# Patient Record
Sex: Female | Born: 1945
Health system: Southern US, Community
[De-identification: ages and names within clinical notes are randomized; demographics above are authoritative.]

## PROBLEM LIST (undated history)

## (undated) ENCOUNTER — Ambulatory Visit

## (undated) DIAGNOSIS — R7303 Prediabetes: Secondary | ICD-10-CM

## (undated) DIAGNOSIS — K259 Gastric ulcer, unspecified as acute or chronic, without hemorrhage or perforation: Secondary | ICD-10-CM

## (undated) DIAGNOSIS — Z8049 Family history of malignant neoplasm of other genital organs: Secondary | ICD-10-CM

## (undated) DIAGNOSIS — G8929 Other chronic pain: Secondary | ICD-10-CM

## (undated) DIAGNOSIS — Z803 Family history of malignant neoplasm of breast: Secondary | ICD-10-CM

## (undated) DIAGNOSIS — M1990 Unspecified osteoarthritis, unspecified site: Secondary | ICD-10-CM

## (undated) DIAGNOSIS — T8859XA Other complications of anesthesia, initial encounter: Secondary | ICD-10-CM

## (undated) DIAGNOSIS — E119 Type 2 diabetes mellitus without complications: Secondary | ICD-10-CM

## (undated) DIAGNOSIS — Z801 Family history of malignant neoplasm of trachea, bronchus and lung: Secondary | ICD-10-CM

## (undated) DIAGNOSIS — I10 Essential (primary) hypertension: Secondary | ICD-10-CM

## (undated) DIAGNOSIS — K219 Gastro-esophageal reflux disease without esophagitis: Secondary | ICD-10-CM

## (undated) HISTORY — DX: Family history of malignant neoplasm of breast: Z80.3

## (undated) HISTORY — PX: BUNIONECTOMY: SHX129

## (undated) HISTORY — DX: Other chronic pain: G89.29

## (undated) HISTORY — PX: ABDOMINAL HYSTERECTOMY: SHX81

## (undated) HISTORY — PX: UPPER GI ENDOSCOPY: SHX6162

## (undated) HISTORY — DX: Prediabetes: R73.03

## (undated) HISTORY — PX: TONSILLECTOMY: SUR1361

## (undated) HISTORY — PX: BREAST ENHANCEMENT SURGERY: SHX7

## (undated) HISTORY — DX: Family history of malignant neoplasm of other genital organs: Z80.49

## (undated) HISTORY — DX: Family history of malignant neoplasm of trachea, bronchus and lung: Z80.1

## (undated) HISTORY — PX: OTHER SURGICAL HISTORY: SHX169

## (undated) HISTORY — PX: CATARACT EXTRACTION: SUR2

## (undated) HISTORY — PX: RADIAL KERATOTOMY: SHX217

---

## 1985-03-15 HISTORY — PX: AUGMENTATION MAMMAPLASTY: SUR837

## 2006-06-15 ENCOUNTER — Emergency Department (HOSPITAL_COMMUNITY): Admission: EM | Admit: 2006-06-15 | Discharge: 2006-06-15 | Payer: Self-pay | Admitting: Emergency Medicine

## 2009-01-21 ENCOUNTER — Ambulatory Visit: Payer: Self-pay

## 2011-10-14 DEATH — deceased

## 2011-11-04 ENCOUNTER — Ambulatory Visit: Payer: Self-pay | Admitting: Family Medicine

## 2011-11-26 ENCOUNTER — Ambulatory Visit: Payer: Self-pay | Admitting: Unknown Physician Specialty

## 2013-01-04 ENCOUNTER — Ambulatory Visit: Payer: Self-pay | Admitting: Family Medicine

## 2013-09-03 DIAGNOSIS — I10 Essential (primary) hypertension: Secondary | ICD-10-CM | POA: Insufficient documentation

## 2014-01-16 ENCOUNTER — Ambulatory Visit: Payer: Self-pay | Admitting: Ophthalmology

## 2014-01-29 NOTE — Pre-Procedure Instructions (Signed)
Tonya Bowen  01/29/2014   Your procedure is scheduled on:  Wednesday February 06, 2014 at 12:00 PM.  Report to [facility] Admitting at 10:00 AM.  Call this number if you have problems the morning of surgery: [phone]  Call this number if you have any questions prior to surgery: [phone]  Remember:   Do not eat food or drink liquids after midnight.   Take these medicines the morning of surgery with A SIP OF WATER: NONE   Do not wear jewelry, make-up or nail polish.  Do not wear lotions, powders, or perfumes.   Do not shave 48 hours prior to surgery.   Do not bring valuables to the hospital.  [facility] is not responsible for any belongings or valuables.               Contacts, dentures or bridgework may not be worn into surgery.  Leave suitcase in the car. After surgery it may be brought to your room.  For patients admitted to the hospital, discharge time is determined by your treatment team.               Patients discharged the day of surgery will not be allowed to drive home.  Name and phone number of your driver:   Special Instructions: Shower using CHG soap the night before and the morning of your surgery   Please read over the following fact sheets that you were given: Pain Booklet, Coughing and Deep Breathing, MRSA Information and Surgical Site Infection Prevention

## 2014-01-30 ENCOUNTER — Ambulatory Visit (HOSPITAL_COMMUNITY)
Admission: RE | Admit: 2014-01-30 | Discharge: 2014-01-30 | Disposition: A | Discharge status: Home / Self Care | Payer: Medicare Other | Source: Ambulatory Visit | Attending: Surgery | Admitting: Surgery

## 2014-01-30 ENCOUNTER — Encounter (HOSPITAL_COMMUNITY): Payer: Self-pay

## 2014-01-30 ENCOUNTER — Encounter (HOSPITAL_COMMUNITY)
Admission: RE | Admit: 2014-01-30 | Discharge: 2014-01-30 | Disposition: A | Discharge status: Home / Self Care | Payer: Medicare Other | Source: Ambulatory Visit | Attending: Orthopedic Surgery | Admitting: Orthopedic Surgery

## 2014-01-30 DIAGNOSIS — Z01818 Encounter for other preprocedural examination: Secondary | ICD-10-CM | POA: Insufficient documentation

## 2014-01-30 DIAGNOSIS — Z01810 Encounter for preprocedural cardiovascular examination: Secondary | ICD-10-CM | POA: Insufficient documentation

## 2014-01-30 HISTORY — DX: Essential (primary) hypertension: I10

## 2014-01-30 HISTORY — DX: Gastric ulcer, unspecified as acute or chronic, without hemorrhage or perforation: K25.9

## 2014-01-30 LAB — URINALYSIS, ROUTINE W REFLEX MICROSCOPIC
BILIRUBIN URINE: NEGATIVE
GLUCOSE, UA: NEGATIVE mg/dL
HGB URINE DIPSTICK: NEGATIVE
KETONES UR: NEGATIVE mg/dL
Nitrite: NEGATIVE
PROTEIN: NEGATIVE mg/dL
Specific Gravity, Urine: 1.02 (ref 1.005–1.030)
UROBILINOGEN UA: 1 mg/dL (ref 0.0–1.0)
pH: 6.5 (ref 5.0–8.0)

## 2014-01-30 LAB — PROTIME-INR
INR: 0.96 (ref 0.00–1.49)
PROTHROMBIN TIME: 12.9 s (ref 11.6–15.2)

## 2014-01-30 LAB — COMPREHENSIVE METABOLIC PANEL
ALBUMIN: 4 g/dL (ref 3.5–5.2)
ALK PHOS: 97 U/L (ref 39–117)
ALT: 26 U/L (ref 0–35)
ANION GAP: 14 (ref 5–15)
AST: 24 U/L (ref 0–37)
BILIRUBIN TOTAL: 0.3 mg/dL (ref 0.3–1.2)
BUN: 11 mg/dL (ref 6–23)
CHLORIDE: 105 meq/L (ref 96–112)
CO2: 25 mEq/L (ref 19–32)
CREATININE: 0.72 mg/dL (ref 0.50–1.10)
Calcium: 9.5 mg/dL (ref 8.4–10.5)
GFR calc non Af Amer: 87 mL/min — ABNORMAL LOW (ref >90)
GLUCOSE: 91 mg/dL (ref 70–99)
POTASSIUM: 4.2 meq/L (ref 3.7–5.3)
Sodium: 144 mEq/L (ref 137–147)
Total Protein: 7.5 g/dL (ref 6.0–8.3)

## 2014-01-30 LAB — APTT: APTT: 26 s (ref 24–37)

## 2014-01-30 LAB — SURGICAL PCR SCREEN
MRSA, PCR: NEGATIVE
Staphylococcus aureus: NEGATIVE

## 2014-01-30 LAB — URINE MICROSCOPIC-ADD ON

## 2014-01-30 LAB — CBC
HEMATOCRIT: 42 % (ref 36.0–46.0)
HEMOGLOBIN: 13.9 g/dL (ref 12.0–15.0)
MCH: 30.2 pg (ref 26.0–34.0)
MCHC: 33.1 g/dL (ref 30.0–36.0)
MCV: 91.3 fL (ref 78.0–100.0)
Platelets: 363 10*3/uL (ref 150–400)
RBC: 4.6 MIL/uL (ref 3.87–5.11)
RDW: 12.7 % (ref 11.5–15.5)
WBC: 9.9 10*3/uL (ref 4.0–10.5)

## 2014-01-30 NOTE — Progress Notes (Signed)
PCP is Dr. Lovie Macadamia. Clearance in EPIC under "care everywhere." Patient informed Nurse that she had a EKG this past week in Dr. Reuel Boom office. EKG requested. Patient denied having any cardiac or pulmonary issues.

## 2014-02-01 NOTE — H&P (Signed)
Tonya Bowen is an 68 y.o. female.    History of Present Illness Collie Siad M. Toomes; 02/01/2014 10:36 AM) The patient is a 68 year old female who comes in today for a preoperative History and Physical. The patient is scheduled for a L2 Kyphoplasty to be performed by Dr. Duane Lope D. Rolena Infante, MD at [facility] on 02/06/14 .  Additional reasons for visit:  Follow-up back is described as the following: The patient is being followed for their back pain. Symptoms reported today include: pain and aching. The patient states that they are doing poorly. The patient reports their current pain level to be 4 / 10. The patient presents today following MRI (lumbar @ [facility]).  Allergies No Known Drug Allergies08/17/2015  Family History Cancer Brother, Mother. Chronic Obstructive Lung Disease Brother. Congestive Heart Failure Father, Sister. Depression Mother. Diabetes Mellitus Maternal Grandmother, Sister. Drug / Alcohol Addiction Father. Heart Disease Father. Hypertension Maternal Grandmother, Mother. Osteoporosis Mother. Rheumatoid Arthritis Brother.  Social History  Tobacco use Never smoker. 10/29/2013 Tobacco / smoke exposure 10/29/2013: no Children 2 Current drinker 10/29/2013: Currently drinks wine only occasionally per week Current work status retired Exercise Exercises rarely; does other Living situation live alone Marital status divorced No history of drug/alcohol rehab Not under pain contract Number of flights of stairs before winded 2-3  Medication History  Vitamin C (Oral) Active. (qd) Vitamin D High Potency (1000UNIT Capsule, Oral) Active. (qd) Calcium 600 (1500MG Tablet, Oral) Active. (qd) AmLODIPine Besylate (5MG Tablet, Oral) Active. (qd) Medications Reconciled  Vitals 02/01/2014 10:34 AM Weight: 219 lb Height: 65in Body Surface Area: 2.06 m Body Mass Index: 36.44 kg/m  Temp.: 98.62F  Pulse: 75 (Regular)  BP: 142/76 (Sitting,  Left Arm, Standard)   Past Medical History  Diagnosis Date  . Hypertension   . Stomach ulcer     Past Surgical History  Procedure Laterality Date  . Abdominal hysterectomy    . Tonsillectomy    . Bunionectomy Bilateral     Great toes  . Breast enhancement surgery    . Cataract extraction Left   . Radial keratotomy    . Colonoscopy    . Upper gi endoscopy      No family history on file. Social History:  reports that she has never smoked. She does not have any smokeless tobacco history on file. She reports that she drinks about 1.2 oz of alcohol per week. She reports that she does not use illicit drugs.  Allergies: No Known Allergies  No prescriptions prior to admission    Results for orders placed or performed during the hospital encounter of 01/30/14 (from the past 48 hour(s))  Surgical pcr screen     Status: None   Collection Time: 01/30/14  1:53 PM  Result Value Ref Range   MRSA, PCR NEGATIVE NEGATIVE   Staphylococcus aureus NEGATIVE NEGATIVE    Comment:        The Xpert SA Assay (FDA approved for NASAL specimens in patients over 60 years of age), is one component of a comprehensive surveillance program.  Test performance has been validated by [employer] for patients greater than or equal to 62 year old. It is not intended to diagnose infection nor to guide or monitor treatment.   Urinalysis, Routine w reflex microscopic     Status: Abnormal   Collection Time: 01/30/14  1:53 PM  Result Value Ref Range   Color, Urine YELLOW YELLOW   APPearance CLEAR CLEAR   Specific Gravity, Urine 1.020 1.005 -  1.030   pH 6.5 5.0 - 8.0   Glucose, UA NEGATIVE NEGATIVE mg/dL   Hgb urine dipstick NEGATIVE NEGATIVE   Bilirubin Urine NEGATIVE NEGATIVE   Ketones, ur NEGATIVE NEGATIVE mg/dL   Protein, ur NEGATIVE NEGATIVE mg/dL   Urobilinogen, UA 1.0 0.0 - 1.0 mg/dL   Nitrite NEGATIVE NEGATIVE   Leukocytes, UA SMALL (A) NEGATIVE  Urine microscopic-add on     Status: None    Collection Time: 01/30/14  1:53 PM  Result Value Ref Range   Squamous Epithelial / LPF RARE RARE   WBC, UA 3-6 <3 WBC/hpf   Bacteria, UA RARE RARE   Urine-Other MUCOUS PRESENT   APTT     Status: None   Collection Time: 01/30/14  1:54 PM  Result Value Ref Range   aPTT 26 24 - 37 seconds  CBC     Status: None   Collection Time: 01/30/14  1:54 PM  Result Value Ref Range   WBC 9.9 4.0 - 10.5 K/uL   RBC 4.60 3.87 - 5.11 MIL/uL   Hemoglobin 13.9 12.0 - 15.0 g/dL   HCT 42.0 36.0 - 46.0 %   MCV 91.3 78.0 - 100.0 fL   MCH 30.2 26.0 - 34.0 pg   MCHC 33.1 30.0 - 36.0 g/dL   RDW 12.7 11.5 - 15.5 %   Platelets 363 150 - 400 K/uL  Comprehensive metabolic panel     Status: Abnormal   Collection Time: 01/30/14  1:54 PM  Result Value Ref Range   Sodium 144 137 - 147 mEq/L   Potassium 4.2 3.7 - 5.3 mEq/L   Chloride 105 96 - 112 mEq/L   CO2 25 19 - 32 mEq/L   Glucose, Bld 91 70 - 99 mg/dL   BUN 11 6 - 23 mg/dL   Creatinine, Ser 0.72 0.50 - 1.10 mg/dL   Calcium 9.5 8.4 - 10.5 mg/dL   Total Protein 7.5 6.0 - 8.3 g/dL   Albumin 4.0 3.5 - 5.2 g/dL   AST 24 0 - 37 U/L   ALT 26 0 - 35 U/L   Alkaline Phosphatase 97 39 - 117 U/L   Total Bilirubin 0.3 0.3 - 1.2 mg/dL   GFR calc non Af Amer 87 (L) >90 mL/min   GFR calc Af Amer >90 >90 mL/min    Comment: (NOTE) The eGFR has been calculated using the CKD EPI equation. This calculation has not been validated in all clinical situations. eGFR's persistently <90 mL/min signify possible Chronic Kidney Disease.    Anion gap 14 5 - 15  Protime-INR     Status: None   Collection Time: 01/30/14  1:54 PM  Result Value Ref Range   Prothrombin Time 12.9 11.6 - 15.2 seconds   INR 0.96 0.00 - 1.49   Dg Chest 2 View  01/30/2014   CLINICAL DATA:  Preop for lumbar spine surgery  EXAM: CHEST  2 VIEW  COMPARISON:  Chest x-ray of 06/15/2006  FINDINGS: No active infiltrate or effusion is seen. Mediastinal and hilar contours are unremarkable. The heart is within  normal limits in size. No acute bony abnormality is seen. Bilateral breast implants again are noted.  IMPRESSION: No active cardiopulmonary disease.   Electronically Signed   By: Ivar Drape M.D.   On: 01/30/2014 14:45    Review of Systems  Constitutional: Negative.   HENT: Negative.   Eyes: Negative.   Respiratory: Negative.   Cardiovascular: Negative.   Gastrointestinal: Negative.   Genitourinary: Negative.  Musculoskeletal: Positive for back pain.  Skin: Negative.   Neurological: Positive for loss of consciousness.  Psychiatric/Behavioral: Negative.     There were no vitals taken for this visit. Physical Exam  Constitutional: She is oriented to person, place, and time. She appears well-developed.  HENT:  Head: Normocephalic and atraumatic.  Eyes: EOM are normal. Pupils are equal, round, and reactive to light.  Neck: Normal range of motion.  Cardiovascular: Normal rate and regular rhythm.   Respiratory: Effort normal and breath sounds normal.  GI: Soft.  Musculoskeletal: Normal range of motion.  Neurological: She is alert and oriented to person, place, and time.  Skin: Skin is warm and dry.  Psychiatric: She has a normal mood and affect.     Assessment & Plan Compression fracture L2  Current Plans Goal Of Surgery:Discussed that goal of surgery is to reduce pain and improve function and quality of life. Patient is aware that despite all appropriate treatment that there pain and function could be the same, worse, or different. Follow up in 2 weeks OWENS,JAMES M 02/01/2014, 10:55 AM  RADIOGRAPHS: X-rays today of the thoracic spine do not demonstrate any T11 deformity. In my last note there was mention of a T11 fracture; however, the T11 vertebral body appears to be fine.  MRI report today demonstrates chronic L2 compression deformity, no evidence of any other significant abnormality.   Assessment & Plan  Compression fracture (T14.8) Plans Transcription At this  point in time, I think the major source of her pain is the L2 fracture. T11 does not appear to be significant at all. At this point we have discussed the risks and benefits of a kyphoplasty. These include infection, bleeding, nerve damage, death, stroke, paralysis, leak of cement, need for further surgery, ongoing or worse pain. All of her questions were addressed. She is expressing a desire to proceed with this.

## 2014-02-05 MED ORDER — ACETAMINOPHEN 10 MG/ML IV SOLN
1000.0000 mg | INTRAVENOUS | Status: AC
  Administered 2014-02-06: 1000 mg via INTRAVENOUS
  Filled 2014-02-05: qty 100

## 2014-02-05 MED ORDER — CEFAZOLIN SODIUM-DEXTROSE 2-3 GM-% IV SOLR
2.0000 g | INTRAVENOUS | Status: AC
  Administered 2014-02-06: 2 g via INTRAVENOUS
  Filled 2014-02-05: qty 50

## 2014-02-05 MED ORDER — DEXAMETHASONE SODIUM PHOSPHATE 4 MG/ML IJ SOLN
4.0000 mg | INTRAMUSCULAR | Status: AC
  Administered 2014-02-06: 4 mg via INTRAVENOUS
  Filled 2014-02-05: qty 1

## 2014-02-06 ENCOUNTER — Ambulatory Visit (HOSPITAL_COMMUNITY): Payer: Medicare Other | Admitting: Anesthesiology

## 2014-02-06 ENCOUNTER — Encounter (HOSPITAL_COMMUNITY)
Admission: RE | Disposition: A | Discharge status: Home / Self Care | Payer: Self-pay | Source: Ambulatory Visit | Attending: Orthopedic Surgery

## 2014-02-06 ENCOUNTER — Observation Stay (HOSPITAL_COMMUNITY)
Admission: RE | Admit: 2014-02-06 | Discharge: 2014-02-07 | Disposition: A | Discharge status: Home / Self Care | Payer: Medicare Other | Source: Ambulatory Visit | Attending: Orthopedic Surgery | Admitting: Orthopedic Surgery

## 2014-02-06 ENCOUNTER — Encounter (HOSPITAL_COMMUNITY): Payer: Self-pay | Admitting: *Deleted

## 2014-02-06 ENCOUNTER — Ambulatory Visit (HOSPITAL_COMMUNITY): Payer: Medicare Other

## 2014-02-06 DIAGNOSIS — E669 Obesity, unspecified: Secondary | ICD-10-CM | POA: Diagnosis not present

## 2014-02-06 DIAGNOSIS — I10 Essential (primary) hypertension: Secondary | ICD-10-CM | POA: Diagnosis not present

## 2014-02-06 DIAGNOSIS — Z6836 Body mass index (BMI) 36.0-36.9, adult: Secondary | ICD-10-CM | POA: Diagnosis not present

## 2014-02-06 DIAGNOSIS — M8008XA Age-related osteoporosis with current pathological fracture, vertebra(e), initial encounter for fracture: Secondary | ICD-10-CM | POA: Diagnosis present

## 2014-02-06 DIAGNOSIS — Z9889 Other specified postprocedural states: Secondary | ICD-10-CM

## 2014-02-06 DIAGNOSIS — Z419 Encounter for procedure for purposes other than remedying health state, unspecified: Secondary | ICD-10-CM

## 2014-02-06 HISTORY — PX: KYPHOPLASTY: SHX5884

## 2014-02-06 SURGERY — KYPHOPLASTY
Anesthesia: General

## 2014-02-06 MED ORDER — DOCUSATE SODIUM 100 MG PO CAPS
100.0000 mg | ORAL_CAPSULE | Freq: Two times a day (BID) | ORAL | Status: DC
Start: 2014-02-06 — End: 2014-02-07
  Administered 2014-02-06: 100 mg via ORAL
  Filled 2014-02-06 (×3): qty 1

## 2014-02-06 MED ORDER — OXYCODONE HCL 5 MG/5ML PO SOLN: 5.0000 mg | Freq: Once | ORAL | Status: DC | PRN

## 2014-02-06 MED ORDER — ONDANSETRON HCL 4 MG PO TABS: 4.0000 mg | ORAL_TABLET | Freq: Four times a day (QID) | ORAL | Status: DC | PRN

## 2014-02-06 MED ORDER — ONDANSETRON HCL 4 MG/2ML IJ SOLN: 4.0000 mg | Freq: Four times a day (QID) | INTRAMUSCULAR | Status: DC | PRN

## 2014-02-06 MED ORDER — ONDANSETRON 4 MG PO TBDP: 4.0000 mg | ORAL_TABLET | Freq: Three times a day (TID) | ORAL | Status: DC | PRN

## 2014-02-06 MED ORDER — POTASSIUM CHLORIDE IN NACL 20-0.45 MEQ/L-% IV SOLN
INTRAVENOUS | Status: DC
  Filled 2014-02-06 (×3): qty 1000

## 2014-02-06 MED ORDER — OXYCODONE HCL 5 MG PO TABS: 5.0000 mg | ORAL_TABLET | Freq: Once | ORAL | Status: DC | PRN

## 2014-02-06 MED ORDER — LACTATED RINGERS IV SOLN
INTRAVENOUS | Status: DC | PRN
  Administered 2014-02-06 (×2): via INTRAVENOUS

## 2014-02-06 MED ORDER — METHOCARBAMOL 500 MG PO TABS: 500.0000 mg | ORAL_TABLET | Freq: Four times a day (QID) | ORAL | Status: DC

## 2014-02-06 MED ORDER — BUPIVACAINE-EPINEPHRINE 0.25% -1:200000 IJ SOLN
INTRAMUSCULAR | Status: DC | PRN
  Administered 2014-02-06: 20 mL

## 2014-02-06 MED ORDER — PREDNISOLONE ACETATE 1 % OP SUSP
1.0000 [drp] | Freq: Two times a day (BID) | OPHTHALMIC | Status: DC
  Administered 2014-02-06: 1 [drp] via OPHTHALMIC
  Filled 2014-02-06: qty 1

## 2014-02-06 MED ORDER — OXYCODONE-ACETAMINOPHEN 10-325 MG PO TABS: 1.0000 | ORAL_TABLET | Freq: Four times a day (QID) | ORAL | Status: DC | PRN

## 2014-02-06 MED ORDER — POLYETHYLENE GLYCOL 3350 17 G PO PACK: 17.0000 g | PACK | Freq: Every day | ORAL | Status: DC

## 2014-02-06 MED ORDER — METOCLOPRAMIDE HCL 5 MG/ML IJ SOLN
5.0000 mg | Freq: Three times a day (TID) | INTRAMUSCULAR | Status: DC | PRN
  Filled 2014-02-06: qty 2

## 2014-02-06 MED ORDER — POLYETHYLENE GLYCOL 3350 17 G PO PACK
17.0000 g | PACK | Freq: Every day | ORAL | Status: DC | PRN
  Filled 2014-02-06: qty 1

## 2014-02-06 MED ORDER — FENTANYL CITRATE 0.05 MG/ML IJ SOLN
INTRAMUSCULAR | Status: DC | PRN
  Administered 2014-02-06: 50 ug via INTRAVENOUS
  Administered 2014-02-06: 100 ug via INTRAVENOUS

## 2014-02-06 MED ORDER — IOHEXOL 300 MG/ML  SOLN
INTRAMUSCULAR | Status: DC | PRN
  Administered 2014-02-06: 1 mL

## 2014-02-06 MED ORDER — BUPIVACAINE-EPINEPHRINE (PF) 0.25% -1:200000 IJ SOLN
INTRAMUSCULAR | Status: AC
  Filled 2014-02-06: qty 30

## 2014-02-06 MED ORDER — METOCLOPRAMIDE HCL 5 MG PO TABS
5.0000 mg | ORAL_TABLET | Freq: Three times a day (TID) | ORAL | Status: DC | PRN
  Filled 2014-02-06: qty 2

## 2014-02-06 MED ORDER — ONDANSETRON HCL 4 MG/2ML IJ SOLN
INTRAMUSCULAR | Status: DC | PRN
  Administered 2014-02-06: 4 mg via INTRAVENOUS

## 2014-02-06 MED ORDER — HYDROMORPHONE HCL 1 MG/ML IJ SOLN: 0.2500 mg | INTRAMUSCULAR | Status: DC | PRN

## 2014-02-06 MED ORDER — EPHEDRINE SULFATE 50 MG/ML IJ SOLN
INTRAMUSCULAR | Status: DC | PRN
  Administered 2014-02-06: 10 mg via INTRAVENOUS

## 2014-02-06 MED ORDER — DEXTROSE 5 % IV SOLN: 500.0000 mg | Freq: Four times a day (QID) | INTRAVENOUS | Status: DC | PRN

## 2014-02-06 MED ORDER — LACTATED RINGERS IV SOLN
INTRAVENOUS | Status: DC
Start: 2014-02-06 — End: 2014-02-07
  Administered 2014-02-06: 11:00:00 via INTRAVENOUS

## 2014-02-06 MED ORDER — LIDOCAINE HCL (CARDIAC) 20 MG/ML IV SOLN
INTRAVENOUS | Status: DC | PRN
  Administered 2014-02-06: 50 mg via INTRAVENOUS

## 2014-02-06 MED ORDER — ROCURONIUM BROMIDE 100 MG/10ML IV SOLN
INTRAVENOUS | Status: DC | PRN
  Administered 2014-02-06: 4 mg via INTRAVENOUS

## 2014-02-06 MED ORDER — OXYCODONE HCL 5 MG PO TABS
5.0000 mg | ORAL_TABLET | ORAL | Status: DC | PRN
  Administered 2014-02-07: 5 mg via ORAL
  Filled 2014-02-06: qty 1

## 2014-02-06 MED ORDER — FENTANYL CITRATE 0.05 MG/ML IJ SOLN
INTRAMUSCULAR | Status: AC
  Filled 2014-02-06: qty 5

## 2014-02-06 MED ORDER — ONDANSETRON HCL 4 MG/2ML IJ SOLN: 4.0000 mg | Freq: Once | INTRAMUSCULAR | Status: DC | PRN

## 2014-02-06 MED ORDER — PROPOFOL 10 MG/ML IV BOLUS
INTRAVENOUS | Status: DC | PRN
  Administered 2014-02-06: 160 mg via INTRAVENOUS

## 2014-02-06 MED ORDER — PROPOFOL 10 MG/ML IV BOLUS
INTRAVENOUS | Status: AC
  Filled 2014-02-06: qty 20

## 2014-02-06 MED ORDER — AMLODIPINE BESYLATE 5 MG PO TABS
5.0000 mg | ORAL_TABLET | Freq: Every evening | ORAL | Status: DC
  Administered 2014-02-06: 5 mg via ORAL
  Filled 2014-02-06 (×2): qty 1

## 2014-02-06 MED ORDER — PREDNISOLONE ACETATE 1 % OP SUSP
1.0000 [drp] | Freq: Two times a day (BID) | OPHTHALMIC | Status: DC
  Filled 2014-02-06: qty 1

## 2014-02-06 MED ORDER — MIDAZOLAM HCL 5 MG/5ML IJ SOLN
INTRAMUSCULAR | Status: DC | PRN
  Administered 2014-02-06: 2 mg via INTRAVENOUS

## 2014-02-06 MED ORDER — NEOSTIGMINE METHYLSULFATE 10 MG/10ML IV SOLN
INTRAVENOUS | Status: DC | PRN
  Administered 2014-02-06: 4 mg via INTRAVENOUS

## 2014-02-06 MED ORDER — DOCUSATE SODIUM 100 MG PO CAPS: 100.0000 mg | ORAL_CAPSULE | Freq: Two times a day (BID) | ORAL | Status: DC

## 2014-02-06 MED ORDER — CEFAZOLIN SODIUM 1-5 GM-% IV SOLN
1.0000 g | Freq: Four times a day (QID) | INTRAVENOUS | Status: AC
  Administered 2014-02-06 (×2): 1 g via INTRAVENOUS
  Filled 2014-02-06 (×2): qty 50

## 2014-02-06 MED ORDER — GLYCOPYRROLATE 0.2 MG/ML IJ SOLN
INTRAMUSCULAR | Status: DC | PRN
  Administered 2014-02-06: .7 mg via INTRAVENOUS

## 2014-02-06 MED ORDER — MORPHINE SULFATE 2 MG/ML IJ SOLN: 1.0000 mg | INTRAMUSCULAR | Status: DC | PRN

## 2014-02-06 MED ORDER — METHOCARBAMOL 500 MG PO TABS
500.0000 mg | ORAL_TABLET | Freq: Four times a day (QID) | ORAL | Status: DC | PRN
Start: 2014-02-06 — End: 2014-02-07
  Administered 2014-02-07: 500 mg via ORAL
  Filled 2014-02-06: qty 1

## 2014-02-06 MED ORDER — MIDAZOLAM HCL 2 MG/2ML IJ SOLN
INTRAMUSCULAR | Status: AC
  Filled 2014-02-06: qty 2

## 2014-02-06 SURGICAL SUPPLY — 42 items
BANDAGE ADH SHEER 1  50/CT (GAUZE/BANDAGES/DRESSINGS) ×6 IMPLANT
BLADE SURG 15 STRL LF DISP TIS (BLADE) ×1 IMPLANT
BLADE SURG 15 STRL SS (BLADE) ×2
CEMENT BONE KYPHX HV R (Orthopedic Implant) ×3 IMPLANT
CEMENT KYPHON C01A KIT/MIXER (Cement) ×3 IMPLANT
COVER MAYO STAND STRL (DRAPES) ×3 IMPLANT
CURETTE WEDGE 8.5MM KYPHX (MISCELLANEOUS) IMPLANT
DRAPE C-ARM 42X72 X-RAY (DRAPES) ×6 IMPLANT
DRAPE INCISE IOBAN 66X45 STRL (DRAPES) ×3 IMPLANT
DRAPE LAPAROTOMY T 102X78X121 (DRAPES) ×3 IMPLANT
DRAPE PROXIMA HALF (DRAPES) ×6 IMPLANT
DRSG MEPILEX BORDER 4X8 (GAUZE/BANDAGES/DRESSINGS) IMPLANT
DURAPREP 26ML APPLICATOR (WOUND CARE) ×3 IMPLANT
ELECT PENCIL ROCKER SW 15FT (MISCELLANEOUS) ×3 IMPLANT
GAUZE SPONGE 4X4 16PLY XRAY LF (GAUZE/BANDAGES/DRESSINGS) ×3 IMPLANT
GLOVE BIOGEL PI IND STRL 8 (GLOVE) ×1 IMPLANT
GLOVE BIOGEL PI INDICATOR 8 (GLOVE) ×2
GLOVE BIOGEL PI ORTHO PRO SZ7 (GLOVE) ×2
GLOVE ECLIPSE 8.5 STRL (GLOVE) ×6 IMPLANT
GLOVE ORTHO TXT STRL SZ7.5 (GLOVE) ×3 IMPLANT
GLOVE PI ORTHO PRO STRL SZ7 (GLOVE) ×1 IMPLANT
GOWN STRL REUS W/ TWL LRG LVL3 (GOWN DISPOSABLE) ×1 IMPLANT
GOWN STRL REUS W/TWL 2XL LVL3 (GOWN DISPOSABLE) ×6 IMPLANT
GOWN STRL REUS W/TWL LRG LVL3 (GOWN DISPOSABLE) ×2
KIT BASIN OR (CUSTOM PROCEDURE TRAY) ×3 IMPLANT
KIT ROOM TURNOVER OR (KITS) ×3 IMPLANT
NEEDLE HYPO 25X1 1.5 SAFETY (NEEDLE) ×3 IMPLANT
NEEDLE SPNL 18GX3.5 QUINCKE PK (NEEDLE) ×6 IMPLANT
NS IRRIG 1000ML POUR BTL (IV SOLUTION) ×3 IMPLANT
PACK SURGICAL SETUP 50X90 (CUSTOM PROCEDURE TRAY) ×3 IMPLANT
PACK UNIVERSAL I (CUSTOM PROCEDURE TRAY) ×3 IMPLANT
PAD ARMBOARD 7.5X6 YLW CONV (MISCELLANEOUS) ×6 IMPLANT
SURGIFLO TRUKIT (HEMOSTASIS) IMPLANT
SUT BONE WAX W31G (SUTURE) ×3 IMPLANT
SUT MON AB 3-0 SH 27 (SUTURE) ×2
SUT MON AB 3-0 SH27 (SUTURE) ×1 IMPLANT
SYR CONTROL 10ML LL (SYRINGE) ×3 IMPLANT
TOWEL OR 17X24 6PK STRL BLUE (TOWEL DISPOSABLE) ×3 IMPLANT
TOWEL OR 17X26 10 PK STRL BLUE (TOWEL DISPOSABLE) ×3 IMPLANT
TRAY KYPHOPAK 15/3 ONESTEP 1ST (MISCELLANEOUS) ×3 IMPLANT
TRAY KYPHOPAK 20/3 ONESTEP 1ST (MISCELLANEOUS) IMPLANT
WATER STERILE IRR 1000ML POUR (IV SOLUTION) IMPLANT

## 2014-02-06 NOTE — Anesthesia Postprocedure Evaluation (Signed)
  Anesthesia Post-op Note  Patient: Tonya Bowen  Procedure(s) Performed: Procedure(s):  L2 KYPHOPLASTY (N/A)  Patient Location: PACU  Anesthesia Type:General  Level of Consciousness: awake, alert  and oriented  Airway and Oxygen Therapy: Patient Spontanous Breathing and Patient connected to nasal cannula oxygen  Post-op Pain: mild  Post-op Assessment: Post-op Vital signs reviewed, Patient's Cardiovascular Status Stable, Respiratory Function Stable, Patent Airway, No signs of Nausea or vomiting and Pain level controlled  Post-op Vital Signs: stable  Last Vitals:  Filed Vitals:   02/06/14 1413  BP:   Pulse:   Temp: 36.2 C  Resp:     Complications: No apparent anesthesia complications

## 2014-02-06 NOTE — Transfer of Care (Signed)
Immediate Anesthesia Transfer of Care Note  Patient: Tonya Bowen  Procedure(s) Performed: Procedure(s):  L2 KYPHOPLASTY (N/A)  Patient Location: PACU  Anesthesia Type:General  Level of Consciousness: awake, alert  and oriented  Airway & Oxygen Therapy: Patient Spontanous Breathing and Patient connected to nasal cannula oxygen  Post-op Assessment: Report given to PACU RN and Post -op Vital signs reviewed and stable  Post vital signs: Reviewed and stable  Complications: No apparent anesthesia complications

## 2014-02-06 NOTE — Plan of Care (Signed)
Problem: Consults Goal: Spinal Surgery Patient Education See Patient Education Module for education specifics. Outcome: Completed/Met Date Met:  02/06/14     

## 2014-02-06 NOTE — Anesthesia Procedure Notes (Signed)
Date/Time: 02/06/2014 12:14 PM Performed by: ADAMI, RICHARD Pre-anesthesia Checklist: Patient identified, Timeout performed, Emergency Drugs available, Suction available and Patient being monitored Oxygen Delivery Method: Circle system utilized Preoxygenation: Pre-oxygenation with 100% oxygen Intubation Type: IV induction Ventilation: Mask ventilation without difficulty and Oral airway inserted - appropriate to patient size Laryngoscope Size: Mac and 3 Grade View: Grade II Tube type: Oral Number of attempts: 1 Airway Equipment and Method: Stylet and LTA kit utilized Placement Confirmation: ETT inserted through vocal cords under direct vision,  breath sounds checked- equal and bilateral and positive ETCO2 Secured at: 21 cm Tube secured with: Tape Dental Injury: Teeth and Oropharynx as per pre-operative assessment      

## 2014-02-06 NOTE — Brief Op Note (Signed)
02/06/2014  6:02 PM  PATIENT:  Rondel Baton  68 y.o. female  PRE-OPERATIVE DIAGNOSIS:  L2 FRACTURE  POST-OPERATIVE DIAGNOSIS:  L2 FRACTURE  PROCEDURE:  Procedure(s):  L2 KYPHOPLASTY (N/A)  SURGEON:  Surgeon(s) and Role:    * Melina Schools, MD - Primary  PHYSICIAN ASSISTANT:   ASSISTANTS: none   ANESTHESIA:   general  EBL:  Total I/O In: 1500 [P.O.:200; I.V.:1300] Out: 300 [Urine:300]  BLOOD ADMINISTERED:none  DRAINS: none   LOCAL MEDICATIONS USED:  MARCAINE     SPECIMEN:  No Specimen  DISPOSITION OF SPECIMEN:  N/A  COUNTS:  YES  TOURNIQUET:  * No tourniquets in log *  DICTATION: .Other Dictation: Dictation Number [id number]  PLAN OF CARE: Admit to inpatient   PATIENT DISPOSITION:  PACU - hemodynamically stable.

## 2014-02-06 NOTE — Plan of Care (Signed)
Problem: Phase II Progression Outcomes Goal: Progress activity as tolerated unless otherwise ordered Outcome: Completed/Met Date Met:  02/06/14     

## 2014-02-06 NOTE — Plan of Care (Signed)
Problem: Consults Goal: Diagnosis - Spinal Surgery Outcome: Completed/Met Date Met:  02/06/14 L2 KYPHOPLASTY

## 2014-02-06 NOTE — Anesthesia Preprocedure Evaluation (Signed)
Anesthesia Evaluation  Patient identified by MRN, date of birth, ID band Patient awake    Reviewed: Allergy & Precautions, H&P , NPO status , Patient's Chart, lab work & pertinent test results  Airway Mallampati: II  TM Distance: >3 FB Neck ROM: Full    Dental  (+) Teeth Intact, Dental Advisory Given   Pulmonary  breath sounds clear to auscultation        Cardiovascular hypertension, Rhythm:Regular Rate:Normal     Neuro/Psych    GI/Hepatic   Endo/Other    Renal/GU      Musculoskeletal   Abdominal (+) + obese,   Peds  Hematology   Anesthesia Other Findings   Reproductive/Obstetrics                             Anesthesia Physical Anesthesia Plan  ASA: II  Anesthesia Plan: General   Post-op Pain Management:    Induction: Intravenous  Airway Management Planned: Oral ETT  Additional Equipment:   Intra-op Plan:   Post-operative Plan:   Informed Consent: I have reviewed the patients History and Physical, chart, labs and discussed the procedure including the risks, benefits and alternatives for the proposed anesthesia with the patient or authorized representative who has indicated his/her understanding and acceptance.   Dental advisory given  Plan Discussed with: CRNA and Anesthesiologist  Anesthesia Plan Comments: (L2 compression fracture Hypertension Obesity  Plan GA with oral ETT  Roberts Gaudy)        Anesthesia Quick Evaluation

## 2014-02-07 DIAGNOSIS — M8008XA Age-related osteoporosis with current pathological fracture, vertebra(e), initial encounter for fracture: Secondary | ICD-10-CM | POA: Diagnosis not present

## 2014-02-07 NOTE — Op Note (Signed)
NAMECHAMPAYNE, KOCIAN NO.:  [id number]  MEDICAL RECORD NO.:  [id number]  LOCATION:  3C05C                        FACILITY:  [facility]  PHYSICIAN:  Dahlia Bailiff, MD    DATE OF BIRTH:  [birth year]  DATE OF PROCEDURE:  02/06/2014 DATE OF DISCHARGE:                              OPERATIVE REPORT   PREOPERATIVE DIAGNOSIS:  Osteoporotic L2 compression fracture.  POSTOPERATIVE DIAGNOSIS:  Osteoporotic L2 compression fracture.  OPERATIVE PROCEDURE:  L2 kyphoplasty.  COMPLICATIONS:  None.  CONDITION:  Stable.  HISTORY:  This is a very pleasant 68 year old woman who has been having progressive back pain despite conservative treatment.  Attempts at conservative management have failed to alleviate her symptoms, as a result we elected to proceed with surgery.  All appropriate risks, benefits, and alternatives were discussed with the patient and consent was obtained.  DESCRIPTION OF PROCEDURE:  The patient was brought to the operating room, placed supine on the operating table.  After successful induction of general anesthesia and endotracheal intubation, TEDs, SCDs were applied.  She was turned prone onto a chest and pelvic roll.  The arms were placed overhead and properly padded.  The back was then prepped and draped in a standard fashion.  Time-out was taken to confirm patient, procedure, and all other pertinent important data.  X-ray was then brought into the field, one in the AP and the other in lateral, both were sterile.  Using biplanar fluoro, I identified the L2 fracture, and then made a small stab incision on the lateral border of the left pedicle.  I percutaneously advanced the Jamshidi needle down to the appropriate starting position.  I then took a transpedicular approach.  I confirmed trajectory in both planes.  Once I confirmed trajectory in position, I advanced the pass posterior wall of the vertebral body.  I then did the same thing on the contralateral  side.  I then noted that on the right I was somewhat careful because in addition to the fracture there was a large Schmorl node and I knew that over inflation on this side would result into possible invasion into the disk space.  So, I carefully under x-ray with fluoro guidance inflated both until I had an adequate fill.  On the right side, I only had about 1.5 mL, on the left though I got an excellent fill up to about 4.5 mL.  I then deflated the balloons and inserted the cement.  On the left side, I had excellent fill with no inferior, posterior, superior, lateral or anterior displacement.  I started to fill on the right side, and then I saw little bit go towards the disk space through that Schmorl node, so I elected to stop.  At this point, once the cement was allowed to harden, I removed the Jamshidi, placed a 3-0 Monocryl and a Band-Aid.  The patient tolerated the procedure well.  She was ultimately extubated, transferred to the PACU without incident.  At the end of the case, all needle and sponge counts were correct.  There was no adverse intraoperative events.     Dahlia Bailiff, MD     DDB/MEDQ  D:  02/06/2014  T:  02/07/2014  Job:  [id number]

## 2014-02-07 NOTE — Progress Notes (Signed)
UR completed 

## 2014-02-07 NOTE — Evaluation (Addendum)
Physical Therapy Evaluation and Discharge Patient Details Name: Tonya Bowen MRN: [id number] DOB: 21-Feb-1946 Today's Date: 02/07/2014   History of Present Illness  68 y.o. female s/p L2 KYPHOPLASTY  Clinical Impression  Patient evaluated by Physical Therapy with no further acute PT needs identified. All education has been completed and the patient has no further questions. Ambulates generally well with no balance loss during therapy session. Safely completed stair training. Reports she feels confident to return home today. See below for any follow-up Physial Therapy or equipment needs. PT is signing off. Thank you for this referral.     Follow Up Recommendations No PT follow up    Equipment Recommendations  None recommended by PT    Recommendations for Other Services       Precautions / Restrictions Precautions Precautions: Back Precaution Booklet Issued: Yes (comment) Precaution Comments: Reviewed precautions Required Braces or Orthoses: Spinal Brace Spinal Brace: Lumbar corset Restrictions Weight Bearing Restrictions: No      Mobility  Bed Mobility Overal bed mobility: Needs Assistance Bed Mobility: Rolling;Sidelying to Sit;Sit to Sidelying Rolling: Supervision Sidelying to sit: Supervision     Sit to sidelying: Supervision General bed mobility comments: Supervision for safety. Educated on log roll technique. Pt able to safely perform without physical assist.  Transfers Overall transfer level: Modified independent                  Ambulation/Gait Ambulation/Gait assistance: Supervision Ambulation Distance (Feet): 400 Feet Assistive device: None Gait Pattern/deviations: Step-through pattern;Decreased stride length;Wide base of support   Gait velocity interpretation: Below normal speed for age/gender General Gait Details: Mildy guarded gait pattern but overall demonstrates good stability during bout. Cues to maintain back precautions and pt was able to  adhear. No loss of balance, denies any dizziness.  Stairs Stairs: Yes Stairs assistance: Supervision Stair Management: One rail Right;Step to pattern;Forwards Number of Stairs: 13 General stair comments: VC for sequencing. no loss of balance. Sfaely performs this task without physical assist. States she feels confident with this task.  Wheelchair Mobility    Modified Rankin (Stroke Patients Only)       Balance Overall balance assessment: Needs assistance Sitting-balance support: No upper extremity supported;Feet supported Sitting balance-Leahy Scale: Good     Standing balance support: No upper extremity supported Standing balance-Leahy Scale: Good                               Pertinent Vitals/Pain Pain Assessment: No/denies pain    Home Living Family/patient expects to be discharged to:: Private residence Living Arrangements: Children Available Help at Discharge: Family;Available PRN/intermittently Type of Home: House Home Access: Stairs to enter Entrance Stairs-Rails: Right Entrance Stairs-Number of Steps: 4 Home Layout: One level Home Equipment: None      Prior Function Level of Independence: Independent               Hand Dominance        Extremity/Trunk Assessment   Upper Extremity Assessment: Defer to OT evaluation           Lower Extremity Assessment: Overall WFL for tasks assessed         Communication   Communication: No difficulties  Cognition Arousal/Alertness: Awake/alert Behavior During Therapy: WFL for tasks assessed/performed Overall Cognitive Status: Within Functional Limits for tasks assessed                      General Comments  General comments (skin integrity, edema, etc.): Reviewed donning/doffing spinal corset.     Exercises General Exercises - Lower Extremity Ankle Circles/Pumps: AROM;Both;10 reps;Seated Long Arc Quad: Strengthening;Both;5 reps;Seated      Assessment/Plan    PT  Assessment Patent does not need any further PT services  PT Diagnosis Abnormality of gait   PT Problem List    PT Treatment Interventions     PT Goals (Current goals can be found in the Care Plan section) Acute Rehab PT Goals Patient Stated Goal: Go home today PT Goal Formulation: All assessment and education complete, DC therapy    Frequency     Barriers to discharge        Co-evaluation               End of Session Equipment Utilized During Treatment: Gait belt;Back brace Activity Tolerance: Patient tolerated treatment well Patient left: in chair;with call bell/phone within reach Nurse Communication: Mobility status    Functional Assessment Tool Used: clinical observation Functional Limitation: Mobility: Walking and moving around Mobility: Walking and Moving Around Current Status ([id number]): At least 1 percent but less than 20 percent impaired, limited or restricted Mobility: Walking and Moving Around Goal Status [phone]): At least 1 percent but less than 20 percent impaired, limited or restricted Mobility: Walking and Moving Around Discharge Status [phone]): At least 1 percent but less than 20 percent impaired, limited or restricted    Time: 0827-0848 PT Time Calculation (min) (ACUTE ONLY): 21 min   Charges:   PT Evaluation $Initial PT Evaluation Tier I: 1 Procedure PT Treatments $Gait Training: 8-22 mins   PT G Codes:   Functional Assessment Tool Used: clinical observation Functional Limitation: Mobility: Walking and moving around    [name] 02/07/2014, 9:44 AM  Tonya Bowen, Tonya Bowen   Addendum for d/c

## 2014-02-07 NOTE — Progress Notes (Signed)
Pt given D/C instructions with Rx's, verbal understanding was provided. Pt's incision is clean and dry with no sign of infection. Pt's IV was removed prior to D/C. Pt D/C'd home via wheelchair @ 1055 per MD order. Pt is stable @ D/C and has no other needs at this time. Holli Humbles, RN

## 2014-02-07 NOTE — Progress Notes (Signed)
   Subjective: 1 Day Post-Op Procedure(s) (LRB):  L2 KYPHOPLASTY (N/A) Patient reports pain as mild.   Patient seen in rounds with Dr. Wynelle Link. Patient is well, and has had no acute complaints or problems Patient is ready to go home today  Objective: Vital signs in last 24 hours: Temp:  [97.2 F (36.2 C)-98.4 F (36.9 C)] 98.1 F (36.7 C) (11/26 0828) Pulse Rate:  [55-83] 73 (11/26 0828) Resp:  [11-20] 16 (11/26 0828) BP: (126-177)/(45-83) 177/74 mmHg (11/26 0828) SpO2:  [92 %-100 %] 99 % (11/26 0828) Weight:  [99.338 kg (219 lb)] 99.338 kg (219 lb) (11/25 1021)  Intake/Output from previous day:  Intake/Output Summary (Last 24 hours) at 02/07/14 0854 Last data filed at 02/06/14 1936  Gross per 24 hour  Intake   1980 ml  Output    300 ml  Net   1680 ml     Labs: No results for input(s): HGB in the last 72 hours. No results for input(s): WBC, RBC, HCT, PLT in the last 72 hours. No results for input(s): NA, K, CL, CO2, BUN, CREATININE, GLUCOSE, CALCIUM in the last 72 hours. No results for input(s): LABPT, INR in the last 72 hours.  EXAM: General - Patient is Alert and Appropriate Extremity - Neurovascular intact Sensation intact distally Dorsiflexion/Plantar flexion intact Dressing - okay Motor Function - intact, moving foot and toes well on exam.   Assessment/Plan: 1 Day Post-Op Procedure(s) (LRB):  L2 KYPHOPLASTY (N/A) Procedure(s) (LRB):  L2 KYPHOPLASTY (N/A) Past Medical History  Diagnosis Date  . Hypertension   . Stomach ulcer    Active Problems:   S/P kyphoplasty  Estimated body mass index is 36.71 kg/(m^2) as calculated from the following:   Height as of this encounter: 5' 4.75" (1.645 m).   Weight as of this encounter: 99.338 kg (219 lb). Discharge home Diet - Cardiac diet Follow up - in 2weeks Activity - WBAT Disposition - Home Condition Upon Discharge - Good D/C Meds - See DC Summary  Arlee Muslim, PA-C Orthopaedic Surgery 02/07/2014, 8:54  AM

## 2014-02-11 ENCOUNTER — Encounter (HOSPITAL_COMMUNITY): Payer: Self-pay | Admitting: Orthopedic Surgery

## 2014-02-20 ENCOUNTER — Ambulatory Visit: Payer: Self-pay | Admitting: Ophthalmology

## 2014-03-06 NOTE — Discharge Summary (Signed)
Patient ID: Tonya Bowen MRN: [id number] DOB/AGE: [id number] y.o.  Admit date: 02/06/2014 Discharge date: 03/06/2014  Admission Diagnoses:  Active Problems:   S/P kyphoplasty   Discharge Diagnoses:  Active Problems:   S/P kyphoplasty  status post Procedure(s):  L2 KYPHOPLASTY  Past Medical History  Diagnosis Date  . Hypertension   . Stomach ulcer     Surgeries: Procedure(s):  L2 KYPHOPLASTY on 02/06/2014   Consultants:    Discharged Condition: Improved  Hospital Course: Tonya Bowen is an 68 y.o. female who was admitted 02/06/2014 for operative treatment of compression fracture. Patient failed conservative treatments (please see the history and physical for the specifics) and had severe unremitting pain that affects sleep, daily activities and work/hobbies. After pre-op clearance, the patient was taken to the operating room on 02/06/2014 and underwent  Procedure(s):  L2 KYPHOPLASTY.    Patient was given perioperative antibiotics:  Anti-infectives    Start     Dose/Rate Route Frequency Ordered Stop   02/06/14 1645  ceFAZolin (ANCEF) IVPB 1 g/50 mL premix     1 g100 mL/hr over 30 Minutes Intravenous Every 6 hours 02/06/14 1610 02/07/14 0020   02/05/14 1339  ceFAZolin (ANCEF) IVPB 2 g/50 mL premix     2 g100 mL/hr over 30 Minutes Intravenous 30 min pre-op 02/05/14 1339 02/06/14 1212       Patient was given sequential compression devices and early ambulation to prevent DVT.   Patient benefited maximally from hospital stay and there were no complications. At the time of discharge, the patient was urinating/moving their bowels without difficulty, tolerating a regular diet, pain is controlled with oral pain medications and they have been cleared by PT/OT.   Recent vital signs: No data found.    Recent laboratory studies: No results for input(s): WBC, HGB, HCT, PLT, NA, K, CL, CO2, BUN, CREATININE, GLUCOSE, INR, CALCIUM in the last 72 hours.  Invalid input(s):  PT, 2   Discharge Medications:     Medication List    TAKE these medications        amLODipine 5 MG tablet  Commonly known as:  NORVASC  Take 5 mg by mouth every evening.     docusate sodium 100 MG capsule  Commonly known as:  COLACE  Take 1 capsule (100 mg total) by mouth 2 (two) times daily.     methocarbamol 500 MG tablet  Commonly known as:  ROBAXIN  Take 1 tablet (500 mg total) by mouth 4 (four) times daily.     ondansetron 4 MG disintegrating tablet  Commonly known as:  ZOFRAN ODT  Take 1 tablet (4 mg total) by mouth every 8 (eight) hours as needed.     oxyCODONE-acetaminophen 10-325 MG per tablet  Commonly known as:  PERCOCET  Take 1 tablet by mouth every 6 (six) hours as needed.     polyethylene glycol packet  Commonly known as:  MIRALAX / GLYCOLAX  Take 17 g by mouth daily.     prednisoLONE acetate 1 % ophthalmic suspension  Commonly known as:  PRED FORTE  Place 1 drop into the left eye See admin instructions. Tapering dose 3 times a day for 1 week, then 2 times a day for 1 week, then daily for 1 week        Diagnostic Studies: Dg Lumbar Spine 2-3 Views  02/06/2014   CLINICAL DATA:  Post vertebroplasty  EXAM: DG C-ARM 61-120 MIN; LUMBAR SPINE - 2-3 VIEW  TECHNIQUE: Vertebroplasty  L2  CONTRAST:  None  FLUOROSCOPY TIME:  1 min 29 seconds  COMPARISON:  None  FINDINGS: Two intraoperative views of lumbar spine submitted. There is vertebroplasty presumable L2 vertebral body. There is anatomic alignment.  IMPRESSION: There is 1 level vertebroplasty upper lumbar spine presumable L2 vertebral body. There is anatomic alignment. Please see the operative report.   Electronically Signed   By: Lahoma Crocker M.D.   On: 02/06/2014 13:49   Dg C-arm 1-60 Min  02/06/2014   CLINICAL DATA:  Post vertebroplasty  EXAM: DG C-ARM 61-120 MIN; LUMBAR SPINE - 2-3 VIEW  TECHNIQUE: Vertebroplasty L2  CONTRAST:  None  FLUOROSCOPY TIME:  1 min 29 seconds  COMPARISON:  None  FINDINGS: Two  intraoperative views of lumbar spine submitted. There is vertebroplasty presumable L2 vertebral body. There is anatomic alignment.  IMPRESSION: There is 1 level vertebroplasty upper lumbar spine presumable L2 vertebral body. There is anatomic alignment. Please see the operative report.   Electronically Signed   By: Lahoma Crocker M.D.   On: 02/06/2014 13:49        Discharge Instructions    Call MD / Call 911    Complete by:  As directed   If you experience chest pain or shortness of breath, CALL 911 and be transported to the hospital emergency room.  If you develope a fever above 101 F, pus (white drainage) or increased drainage or redness at the wound, or calf pain, call your surgeon's office.     Constipation Prevention    Complete by:  As directed   Drink plenty of fluids.  Prune juice may be helpful.  You may use a stool softener, such as Colace (over the counter) 100 mg twice a day.  Use MiraLax (over the counter) for constipation as needed.     Diet - low sodium heart healthy    Complete by:  As directed      Discharge instructions    Complete by:  As directed   Ok to shower 5 days postop.  Do not apply any creams or ointments to incision.  Do not remove steri-strips.  Can use 4x4 gauze and tape for dressing changes.  No aggressive activity.  No bending, squatting or prolonged sitting.  Mostly be in reclined position or lying down.  If brace has been given must use when up and ambulating.     Driving restrictions    Complete by:  As directed   No driving until further notice.     Increase activity slowly as tolerated    Complete by:  As directed      Lifting restrictions    Complete by:  As directed   No lifting until further notice.           Follow-up Information    Follow up with Dahlia Bailiff, MD.   Specialty:  Orthopedic Surgery   Why:  need return office visit 2 weeks postop   Contact information:   [address] [place] [address] [phone]         Discharge Plan:  discharge to home  Disposition: neuro intact pain decreased.  [place] for d/c will f/u in 2 weeks.    Signed: Melina Schools D for Dr. Melina Schools [facility] [phone] 03/06/2014, 8:33 AM

## 2014-03-28 ENCOUNTER — Ambulatory Visit: Payer: Self-pay | Admitting: Family Medicine

## 2014-04-23 ENCOUNTER — Ambulatory Visit: Payer: Self-pay | Admitting: Family Medicine

## 2014-10-04 DIAGNOSIS — M8580 Other specified disorders of bone density and structure, unspecified site: Secondary | ICD-10-CM | POA: Insufficient documentation

## 2015-04-30 ENCOUNTER — Other Ambulatory Visit: Payer: Self-pay | Admitting: Family Medicine

## 2015-04-30 DIAGNOSIS — Z1231 Encounter for screening mammogram for malignant neoplasm of breast: Secondary | ICD-10-CM

## 2015-05-08 ENCOUNTER — Ambulatory Visit
Admission: RE | Admit: 2015-05-08 | Discharge: 2015-05-08 | Disposition: A | Discharge status: Home / Self Care | Payer: Medicare Other | Source: Ambulatory Visit | Attending: Family Medicine | Admitting: Family Medicine

## 2015-05-08 ENCOUNTER — Other Ambulatory Visit: Payer: Self-pay | Admitting: Family Medicine

## 2015-05-08 DIAGNOSIS — Z9882 Breast implant status: Secondary | ICD-10-CM | POA: Diagnosis not present

## 2015-05-08 DIAGNOSIS — Z1231 Encounter for screening mammogram for malignant neoplasm of breast: Secondary | ICD-10-CM | POA: Insufficient documentation

## 2016-05-10 ENCOUNTER — Other Ambulatory Visit: Payer: Self-pay | Admitting: Family Medicine

## 2016-05-10 DIAGNOSIS — Z1239 Encounter for other screening for malignant neoplasm of breast: Secondary | ICD-10-CM

## 2016-06-08 ENCOUNTER — Ambulatory Visit
Admission: RE | Admit: 2016-06-08 | Discharge: 2016-06-08 | Disposition: A | Discharge status: Home / Self Care | Payer: Medicare Other | Source: Ambulatory Visit | Attending: Family Medicine | Admitting: Family Medicine

## 2016-06-08 DIAGNOSIS — Z1231 Encounter for screening mammogram for malignant neoplasm of breast: Secondary | ICD-10-CM | POA: Diagnosis not present

## 2016-06-08 DIAGNOSIS — Z1239 Encounter for other screening for malignant neoplasm of breast: Secondary | ICD-10-CM

## 2017-07-15 ENCOUNTER — Other Ambulatory Visit: Payer: Self-pay | Admitting: Family Medicine

## 2017-07-15 DIAGNOSIS — Z1231 Encounter for screening mammogram for malignant neoplasm of breast: Secondary | ICD-10-CM

## 2017-07-20 ENCOUNTER — Encounter (INDEPENDENT_AMBULATORY_CARE_PROVIDER_SITE_OTHER): Payer: Self-pay

## 2017-07-20 ENCOUNTER — Ambulatory Visit
Admission: RE | Admit: 2017-07-20 | Discharge: 2017-07-20 | Disposition: A | Discharge status: Home / Self Care | Payer: Medicare Other | Source: Ambulatory Visit | Attending: Family Medicine | Admitting: Family Medicine

## 2017-07-20 DIAGNOSIS — Z1231 Encounter for screening mammogram for malignant neoplasm of breast: Secondary | ICD-10-CM | POA: Insufficient documentation

## 2017-12-09 DIAGNOSIS — R739 Hyperglycemia, unspecified: Secondary | ICD-10-CM | POA: Insufficient documentation

## 2018-04-26 ENCOUNTER — Ambulatory Visit (INDEPENDENT_AMBULATORY_CARE_PROVIDER_SITE_OTHER): Payer: Medicare Other

## 2018-04-26 ENCOUNTER — Other Ambulatory Visit: Payer: Self-pay | Admitting: Podiatry

## 2018-04-26 ENCOUNTER — Encounter: Payer: Self-pay | Admitting: Podiatry

## 2018-04-26 ENCOUNTER — Ambulatory Visit: Payer: Medicare Other | Admitting: Podiatry

## 2018-04-26 VITALS — BP 158/77 | HR 86 | Resp 16

## 2018-04-26 DIAGNOSIS — M722 Plantar fascial fibromatosis: Secondary | ICD-10-CM

## 2018-04-26 DIAGNOSIS — M7662 Achilles tendinitis, left leg: Secondary | ICD-10-CM

## 2018-04-26 MED ORDER — METHYLPREDNISOLONE 4 MG PO TBPK: ORAL_TABLET | ORAL | 0 refills | Status: DC

## 2018-04-26 MED ORDER — MELOXICAM 15 MG PO TABS: 15.0000 mg | ORAL_TABLET | Freq: Every day | ORAL | 3 refills | Status: DC

## 2018-04-26 NOTE — Progress Notes (Signed)
Subjective:  Patient ID: Tonya Bowen, female    DOB: [birth year],  MRN: [id number] HPI Chief Complaint  Patient presents with  . Foot Pain    Posterior heel left - aching, burning, some sharp x 1 month, history of achilles tendonitis, feels weak going down steps, AM pain, tried Biofreeze and ankle type brace  . New Patient (Initial Visit)    73 y.o. female presents with the above complaint.   ROS: Denies fever chills nausea vomiting muscle aches pains calf pain back pain chest pain shortness of breath.  Past Medical History:  Diagnosis Date  . Hypertension   . Stomach ulcer    Past Surgical History:  Procedure Laterality Date  . ABDOMINAL HYSTERECTOMY    . AUGMENTATION MAMMAPLASTY Bilateral 1987  . BREAST ENHANCEMENT SURGERY    . BUNIONECTOMY Bilateral    Great toes  . CATARACT EXTRACTION Left   . colonoscopy    . KYPHOPLASTY N/A 02/06/2014   Procedure:  L2 KYPHOPLASTY;  Surgeon: Melina Schools, MD;  Location: [place];  Service: Orthopedics;  Laterality: N/A;  . RADIAL KERATOTOMY    . TONSILLECTOMY    . UPPER GI ENDOSCOPY      Current Outpatient Medications:  Marland Kitchen  Vitamin D, Ergocalciferol, (DRISDOL) 1.25 MG (50000 UT) CAPS capsule, Take by mouth., Disp: , Rfl:  .  amLODipine (NORVASC) 5 MG tablet, Take 5 mg by mouth every evening. , Disp: , Rfl: 1 .  meloxicam (MOBIC) 15 MG tablet, Take 1 tablet (15 mg total) by mouth daily., Disp: 30 tablet, Rfl: 3 .  methylPREDNISolone (MEDROL DOSEPAK) 4 MG TBPK tablet, 6 day dose pack - take as directed, Disp: 21 tablet, Rfl: 0 .  prednisoLONE acetate (PRED FORTE) 1 % ophthalmic suspension, Place 1 drop into the left eye See admin instructions. Tapering dose 3 times a day for 1 week, then 2 times a day for 1 week, then daily for 1 week, Disp: , Rfl: 0  No Known Allergies Review of Systems Objective:   Vitals:   04/26/18 1407  BP: (!) 158/77  Pulse: 86  Resp: 16    General: Well developed, nourished, in no acute distress, alert and  oriented x3   Dermatological: Skin is warm, dry and supple bilateral. Nails x 10 are well maintained; remaining integument appears unremarkable at this time. There are no open sores, no preulcerative lesions, no rash or signs of infection present.  Vascular: Dorsalis Pedis artery and Posterior Tibial artery pedal pulses are 2/4 bilateral with immedate capillary fill time. Pedal hair growth present. No varicosities and no lower extremity edema present bilateral.   Neruologic: Grossly intact via light touch bilateral. Vibratory intact via tuning fork bilateral. Protective threshold with Semmes Wienstein monofilament intact to all pedal sites bilateral. Patellar and Achilles deep tendon reflexes 2+ bilateral. No Babinski or clonus noted bilateral.   Musculoskeletal: No gross boney pedal deformities bilateral. No pain, crepitus, or limitation noted with foot and ankle range of motion bilateral. Muscular strength 5/5 in all groups tested bilateral.  Gait: Unassisted, Nonantalgic.    Radiographs:  Radiographs taken today demonstrate mild thickening of the Achilles tendon at its insertion site of the posterior aspect of the left heel.  Assessment & Plan:   Assessment: Insertional Achilles tendinitis left heel.  Plan: Injected the area with dexamethasone and local anesthetic 2 mg point maximal tenderness after sterile Betadine skin prep also her in a cam walker start her on a Medrol Dosepak and will follow-up with her  in 1 month      T. [place], [place]

## 2018-05-24 ENCOUNTER — Other Ambulatory Visit: Payer: Self-pay

## 2018-05-24 ENCOUNTER — Encounter: Payer: Self-pay | Admitting: Podiatry

## 2018-05-24 ENCOUNTER — Ambulatory Visit: Payer: Medicare Other | Admitting: Podiatry

## 2018-05-24 DIAGNOSIS — M7662 Achilles tendinitis, left leg: Secondary | ICD-10-CM

## 2018-05-24 NOTE — Progress Notes (Signed)
She presents today for follow-up of her Achilles tendinitis left states that is feeling about 80% improved.  She is doing great but every once in a while she has a reminder and she has to wear her boot.  Objective: Vital signs are stable alert oriented x3 much decrease in edema erythema and warmth on palpation to the posterior aspect of the left heel.  She has some mild tenderness on palpation of the posterior medial aspect of the left heel with some fluctuance.  Assessment: Insertional Achilles tendinitis 80% resolved.  Plan: After sterile Betadine skin prep I injected 2 mg of dexamethasone and local anesthetic to the bursa beneath the skin.  Making sure not to inject into the tendon itself I injected the bursa until it inflated.  We will follow-up with her in about another month when this becomes painful she will wear her boot if necessary otherwise an MRI will be necessary to confirm that there are no tears.

## 2018-08-12 ENCOUNTER — Other Ambulatory Visit: Payer: Self-pay | Admitting: Podiatry

## 2018-09-09 ENCOUNTER — Other Ambulatory Visit: Payer: Self-pay | Admitting: Family Medicine

## 2018-09-09 DIAGNOSIS — Z20822 Contact with and (suspected) exposure to covid-19: Secondary | ICD-10-CM

## 2018-09-15 LAB — NOVEL CORONAVIRUS, NAA: SARS-CoV-2, NAA: NOT DETECTED

## 2018-11-16 ENCOUNTER — Other Ambulatory Visit: Payer: Self-pay | Admitting: Family Medicine

## 2018-11-16 DIAGNOSIS — Z1231 Encounter for screening mammogram for malignant neoplasm of breast: Secondary | ICD-10-CM

## 2018-11-29 ENCOUNTER — Other Ambulatory Visit: Payer: Self-pay

## 2018-11-29 ENCOUNTER — Ambulatory Visit
Admission: RE | Admit: 2018-11-29 | Discharge: 2018-11-29 | Disposition: A | Discharge status: Home / Self Care | Payer: Medicare Other | Source: Ambulatory Visit | Attending: Family Medicine | Admitting: Family Medicine

## 2018-11-29 DIAGNOSIS — Z1231 Encounter for screening mammogram for malignant neoplasm of breast: Secondary | ICD-10-CM | POA: Diagnosis not present

## 2018-12-10 ENCOUNTER — Other Ambulatory Visit: Payer: Self-pay | Admitting: Podiatry

## 2019-03-28 ENCOUNTER — Other Ambulatory Visit: Payer: Self-pay | Admitting: Podiatry

## 2019-04-21 ENCOUNTER — Other Ambulatory Visit: Payer: Self-pay | Admitting: Podiatry

## 2019-07-12 ENCOUNTER — Other Ambulatory Visit: Payer: Self-pay | Admitting: *Deleted

## 2019-10-18 ENCOUNTER — Other Ambulatory Visit: Payer: Self-pay | Admitting: Family Medicine

## 2019-10-18 DIAGNOSIS — Z1231 Encounter for screening mammogram for malignant neoplasm of breast: Secondary | ICD-10-CM

## 2019-12-04 ENCOUNTER — Ambulatory Visit
Admission: RE | Admit: 2019-12-04 | Discharge: 2019-12-04 | Disposition: A | Discharge status: Home / Self Care | Payer: Medicare Other | Source: Ambulatory Visit | Attending: Family Medicine | Admitting: Family Medicine

## 2019-12-04 ENCOUNTER — Other Ambulatory Visit: Payer: Self-pay

## 2019-12-04 DIAGNOSIS — Z1231 Encounter for screening mammogram for malignant neoplasm of breast: Secondary | ICD-10-CM | POA: Diagnosis present

## 2019-12-11 ENCOUNTER — Other Ambulatory Visit: Payer: Self-pay | Admitting: Family Medicine

## 2019-12-11 DIAGNOSIS — N6320 Unspecified lump in the left breast, unspecified quadrant: Secondary | ICD-10-CM

## 2019-12-11 DIAGNOSIS — R928 Other abnormal and inconclusive findings on diagnostic imaging of breast: Secondary | ICD-10-CM

## 2019-12-19 ENCOUNTER — Ambulatory Visit
Admission: RE | Admit: 2019-12-19 | Discharge: 2019-12-19 | Disposition: A | Discharge status: Home / Self Care | Payer: Medicare Other | Source: Ambulatory Visit | Attending: Family Medicine | Admitting: Family Medicine

## 2019-12-19 ENCOUNTER — Other Ambulatory Visit: Payer: Self-pay

## 2019-12-19 DIAGNOSIS — R928 Other abnormal and inconclusive findings on diagnostic imaging of breast: Secondary | ICD-10-CM | POA: Insufficient documentation

## 2019-12-19 DIAGNOSIS — N6320 Unspecified lump in the left breast, unspecified quadrant: Secondary | ICD-10-CM

## 2019-12-24 ENCOUNTER — Other Ambulatory Visit: Payer: Self-pay | Admitting: Family Medicine

## 2019-12-24 DIAGNOSIS — R928 Other abnormal and inconclusive findings on diagnostic imaging of breast: Secondary | ICD-10-CM

## 2019-12-24 DIAGNOSIS — N6320 Unspecified lump in the left breast, unspecified quadrant: Secondary | ICD-10-CM

## 2019-12-27 ENCOUNTER — Ambulatory Visit
Admission: RE | Admit: 2019-12-27 | Discharge: 2019-12-27 | Disposition: A | Discharge status: Home / Self Care | Payer: Medicare Other | Source: Ambulatory Visit | Attending: Family Medicine | Admitting: Family Medicine

## 2019-12-27 ENCOUNTER — Other Ambulatory Visit: Payer: Self-pay

## 2019-12-27 DIAGNOSIS — N6320 Unspecified lump in the left breast, unspecified quadrant: Secondary | ICD-10-CM

## 2019-12-27 DIAGNOSIS — R928 Other abnormal and inconclusive findings on diagnostic imaging of breast: Secondary | ICD-10-CM

## 2019-12-27 DIAGNOSIS — C801 Malignant (primary) neoplasm, unspecified: Secondary | ICD-10-CM

## 2019-12-27 HISTORY — DX: Malignant (primary) neoplasm, unspecified: C80.1

## 2019-12-28 ENCOUNTER — Encounter: Payer: Self-pay | Admitting: *Deleted

## 2019-12-28 NOTE — Progress Notes (Unsigned)
Tried to call patient to establish navigation services.  No answer and no voicemail.  Will call her back on Monday.

## 2019-12-31 ENCOUNTER — Encounter: Payer: Self-pay | Admitting: *Deleted

## 2019-12-31 DIAGNOSIS — C50912 Malignant neoplasm of unspecified site of left female breast: Secondary | ICD-10-CM

## 2019-12-31 NOTE — Progress Notes (Signed)
Called patient to establish navigation services.  Tonya Bowen is newly diagnosed with invasive mammary carcinoma of the left breast.  Tonya Bowen lives with her daughter who has ALS, and is bedridden and on a ventolator.  Tonya Bowen does get nursing care for her daughter and has friends for support.  States Tonya Bowen would like to see Dr. Bary Castilla for surgical consult.  Tonya Bowen thinks Tonya Bowen has seen him before.  Patient needs afternoon appointments.  I have scheduled her to see Dr. Tasia Catchings on Thursday at 2:00 and Dr. Bary Castilla at 4:15.  Tonya Bowen is to call with any questions or needs.

## 2020-01-02 ENCOUNTER — Ambulatory Visit
Payer: Medicare Other | Attending: Student in an Organized Health Care Education/Training Program | Admitting: Student in an Organized Health Care Education/Training Program

## 2020-01-02 ENCOUNTER — Encounter: Payer: Self-pay | Admitting: Student in an Organized Health Care Education/Training Program

## 2020-01-02 ENCOUNTER — Other Ambulatory Visit: Payer: Self-pay

## 2020-01-02 VITALS — BP 115/62 | HR 87 | Temp 97.1°F | Resp 16 | Ht 64.0 in | Wt 208.4 lb

## 2020-01-02 DIAGNOSIS — M47816 Spondylosis without myelopathy or radiculopathy, lumbar region: Secondary | ICD-10-CM | POA: Insufficient documentation

## 2020-01-02 DIAGNOSIS — Z9889 Other specified postprocedural states: Secondary | ICD-10-CM | POA: Insufficient documentation

## 2020-01-02 DIAGNOSIS — M546 Pain in thoracic spine: Secondary | ICD-10-CM | POA: Insufficient documentation

## 2020-01-02 DIAGNOSIS — G894 Chronic pain syndrome: Secondary | ICD-10-CM | POA: Insufficient documentation

## 2020-01-02 DIAGNOSIS — M5134 Other intervertebral disc degeneration, thoracic region: Secondary | ICD-10-CM | POA: Insufficient documentation

## 2020-01-02 DIAGNOSIS — M51369 Other intervertebral disc degeneration, lumbar region without mention of lumbar back pain or lower extremity pain: Secondary | ICD-10-CM | POA: Insufficient documentation

## 2020-01-02 DIAGNOSIS — G8929 Other chronic pain: Secondary | ICD-10-CM | POA: Diagnosis present

## 2020-01-02 DIAGNOSIS — M5136 Other intervertebral disc degeneration, lumbar region: Secondary | ICD-10-CM | POA: Diagnosis not present

## 2020-01-02 LAB — SURGICAL PATHOLOGY

## 2020-01-02 MED ORDER — GABAPENTIN 100 MG PO CAPS: ORAL_CAPSULE | ORAL | 0 refills | Status: DC

## 2020-01-02 NOTE — Patient Instructions (Addendum)
You have been referred to physical therapy.  You will have cervical spine and lumbar spine MRIs prior to next appt. with pain clinic.  Gabapentin has been escribed to your pharmacy.  Take 1 capsule (100 mg total) by mouth daily for 15 days,  THEN 2 capsules (200 mg total) daily for 15 days,  THEN 3 capsules (300 mg total) daily.

## 2020-01-02 NOTE — Progress Notes (Signed)
Safety precautions to be maintained throughout the outpatient stay will include: orient to surroundings, keep bed in low position, maintain call bell within reach at all times, provide assistance with transfer out of bed and ambulation.  

## 2020-01-02 NOTE — Progress Notes (Signed)
Patient: Tonya Bowen  Service Category: E/M  Provider: Gillis Santa, MD  DOB: Jun 26, 1945  DOS: 01/02/2020  Referring Provider: Juluis Pitch, MD  MRN: [id number]  Setting: Ambulatory outpatient  PCP: Juluis Pitch, MD  Type: New Patient  Specialty: Interventional Pain Management    Location: Office  Delivery: Face-to-face     Primary Reason(s) for Visit: Encounter for initial evaluation of one or more chronic problems (new to examiner) potentially causing chronic pain, and posing a threat to normal musculoskeletal function. (Level of risk: High) CC: Back Pain (lower) and Hand Pain (some bilat numb/tingling; LEFT worse)  HPI  Tonya Bowen is a 74 y.o. year old, female patient, who comes for the first time to our practice referred by Juluis Pitch, MD for our initial evaluation of her chronic pain. She has S/P kyphoplasty; Benign essential hypertension; Hyperglycemia; Osteopenia; Chronic midline thoracic back pain; Lumbar degenerative disc disease; Lumbar facet arthropathy; and Chronic pain syndrome on their problem list. Today she comes in for evaluation of her Back Pain (lower) and Hand Pain (some bilat numb/tingling; LEFT worse)  Pain Assessment: Location: Lower Back Radiating: up back to shoulders bilat Onset: More than a month ago Duration: Chronic pain Quality: Aching, Sharp, Shooting Severity: 5 /10 (subjective, self-reported pain score)  Effect on ADL: limits daily activities Timing: Constant (level of intesity changes with activitiy) Modifying factors: tylenol help some, heat BP: 115/62   HR: 87  Onset and Duration: Gradual Cause of pain: back injury Severity: No change since onset, NAS-11 at its worse: 10/10, NAS-11 at its best: 4/10, NAS-11 now: 2/10 and NAS-11 on the average: 7/10 Timing: Not influenced by the time of the day, During activity or exercise, After activity or exercise and After a period of immobility Aggravating Factors: Bending, Climbing, Kneeling, Lifiting,  Motion, Prolonged sitting, Prolonged standing, Squatting, Stooping , Twisting, Walking, Walking uphill, Walking downhill and Working Alleviating Factors: Hot packs, Lying down, Sitting and Standing Associated Problems: Fatigue, Numbness, Sadness, Spasms, Temperature changes, Tingling, Weakness and Pain that does not allow patient to sleep Quality of Pain: Aching, Agonizing, Constant, Disabling, Distressing, Dreadful, Exhausting, Horrible, Punishing, Sharp, Shooting, Sickening, Tiring, Toothache-like and Uncomfortable Previous Examinations or Tests: Endoscopy, MRI scan, X-rays, Orthopedic evaluation and Chiropractic evaluation Previous Treatments: The patient denies previous treatments   Tonya Bowen is a pleasant 74 year old female who presents with a chief complaint of mid back pain that does not radiate.  Of note she has a history of L2 kyphoplasty for compression fracture that she sustained in 2015.  She states that the kyphoplasty was helpful.  She is endorsing pain that is similar in nature to what she was experiencing prior to her kyphoplasty.  She has done physical therapy in the past with limited pain relief.  She has a history of gastric ulcers and so is unable to tolerate NSAIDs.  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  Patient denies having tried gabapentin, Lyrica or Cymbalta.  She wants to avoid any medications that could potentially make her sedated.  Unfortunately, she was diagnosed with breast cancer recently and has an upcoming appointment with oncology.  She is currently not on opioid therapy.   Historic Controlled Substance Pharmacotherapy Review  Historical Monitoring: The patient  reports no history of drug use. List of all UDS Test(s): No results found for: MDMA, COCAINSCRNUR, PCPSCRNUR, [place], CANNABQUANT, [place], [address] List of other Serum/Urine Drug  Screening Test(s):  No results found for:  AMPHSCRSER, BARBSCRSER, BENZOSCRSER, COCAINSCRSER, COCAINSCRNUR, PCPSCRSER, PCPQUANT, THCSCRSER, THCU, CANNABQUANT, OPIATESCRSER, OXYSCRSER, PROPOXSCRSER, ETH Historical Background Evaluation: [place] PMP: PDMP not reviewed this encounter. Online review of the past 83-month period conducted.             [place] Department of public safety, offender search: Editor, commissioning Information) Non-contributory Risk Assessment Profile: Aberrant behavior: None observed or detected today Risk factors for fatal opioid overdose: None identified today Fatal overdose hazard ratio (HR): Calculation deferred Non-fatal overdose hazard ratio (HR): Calculation deferred Risk of opioid abuse or dependence: 0.7-3.0% with doses ? 36 MME/day and 6.1-26% with doses ? 120 MME/day. Substance use disorder (SUD) risk level: See below Personal History of Substance Abuse (SUD-Substance use disorder):  Alcohol: Negative  Illegal Drugs: Negative  Rx Drugs: Negative  ORT Risk Level calculation: Low Risk  Opioid Risk Tool - 01/02/20 1436      Family History of Substance Abuse   Alcohol Positive Female    Illegal Drugs Negative    Rx Drugs Negative      Personal History of Substance Abuse   Alcohol Negative    Illegal Drugs Negative    Rx Drugs Negative      Age   Age between 61-45 years  No      History of Preadolescent Sexual Abuse   History of Preadolescent Sexual Abuse Negative or Female      Psychological Disease   Psychological Disease Negative    Depression Negative      Total Score   Opioid Risk Tool Scoring 1    Opioid Risk Interpretation Low Risk          ORT Scoring interpretation table:  Score <3 = Low Risk for SUD  Score between 4-7 = Moderate Risk for SUD  Score >8 = High Risk for Opioid Abuse   PHQ-2 Depression Scale:  Total score:    PHQ-2 Scoring interpretation table: (Score and probability of major depressive disorder)  Score 0 = No depression  Score 1 = 15.4% Probability  Score 2 = 21.1% Probability   Score 3 = 38.4% Probability  Score 4 = 45.5% Probability  Score 5 = 56.4% Probability  Score 6 = 78.6% Probability   PHQ-9 Depression Scale:  Total score:    PHQ-9 Scoring interpretation table:  Score 0-4 = No depression  Score 5-9 = Mild depression  Score 10-14 = Moderate depression  Score 15-19 = Moderately severe depression  Score 20-27 = Severe depression (2.4 times higher risk of SUD and 2.89 times higher risk of overuse)   Pharmacologic Plan: As per protocol, I have not taken over any controlled substance management, pending the results of ordered tests and/or consults.            Initial impression: Pending review of available data and ordered tests.  Meds   Current Outpatient Medications:    acetaminophen (TYLENOL) 325 MG tablet, Take 650 mg by mouth every 6 (six) hours as needed., Disp: , Rfl:    amLODipine (NORVASC) 5 MG tablet, Take 5 mg by mouth every evening. , Disp: , Rfl: 1   calcium-vitamin D (OSCAL WITH D) 250-125 MG-UNIT tablet, Take 1 tablet by mouth daily., Disp: , Rfl:    cetirizine (ZYRTEC) 10 MG tablet, Take 10 mg by mouth daily., Disp: , Rfl:    lisinopril-hydrochlorothiazide (ZESTORETIC) 20-12.5 MG tablet, Take 1 tablet by mouth daily., Disp: , Rfl:    prednisoLONE acetate (PRED FORTE) 1 %  ophthalmic suspension, Place 1 drop into the left eye See admin instructions. Tapering dose 3 times a day for 1 week, then 2 times a day for 1 week, then daily for 1 week, Disp: , Rfl: 0   Semaglutide (OZEMPIC, 0.25 OR 0.5 MG/DOSE, [place]), Inject into the skin once a week., Disp: , Rfl:    Vitamin D, Ergocalciferol, (DRISDOL) 1.25 MG (50000 UT) CAPS capsule, Take by mouth., Disp: , Rfl:    gabapentin (NEURONTIN) 100 MG capsule, Take 1 capsule (100 mg total) by mouth daily for 15 days, THEN 2 capsules (200 mg total) daily for 15 days, THEN 2 capsules (200 mg total) daily., Disp: 105 capsule, Rfl: 0   meloxicam (MOBIC) 15 MG tablet, TAKE 1 TABLET BY MOUTH EVERY DAY  (Patient not taking: Reported on 01/02/2020), Disp: 30 tablet, Rfl: 0  Imaging Review   Lumbar DG 2-3 views: Results for orders placed during the hospital encounter of 02/06/14  DG Lumbar Spine 2-3 Views  Narrative CLINICAL DATA:  Post vertebroplasty  EXAM: DG C-ARM 61-120 MIN; LUMBAR SPINE - 2-3 VIEW  TECHNIQUE: Vertebroplasty L2  CONTRAST:  None  FLUOROSCOPY TIME:  1 min 29 seconds  COMPARISON:  None  FINDINGS: Two intraoperative views of lumbar spine submitted. There is vertebroplasty presumable L2 vertebral body. There is anatomic alignment.  IMPRESSION: There is 1 level vertebroplasty upper lumbar spine presumable L2 vertebral body. There is anatomic alignment. Please see the operative report.   Electronically Signed By: Lahoma Crocker M.D. On: 02/06/2014 13:49   Complexity Note: Imaging results reviewed. Results shared with Ms. Espinoza, using Layman's terms.                         ROS  Cardiovascular: High blood pressure Pulmonary or Respiratory: Shortness of breath Neurological: No reported neurological signs or symptoms such as seizures, abnormal skin sensations, urinary and/or fecal incontinence, being born with an abnormal open spine and/or a tethered spinal cord Psychological-Psychiatric: No reported psychological or psychiatric signs or symptoms such as difficulty sleeping, anxiety, depression, delusions or hallucinations (schizophrenial), mood swings (bipolar disorders) or suicidal ideations or attempts Gastrointestinal: Vomiting blood (Ulcers) Genitourinary: Recurrent Urinary Tract infections Hematological: Brusing easily Endocrine: diabetes Rheumatologic: Joint aches and or swelling due to excess weight (Osteoarthritis) Musculoskeletal: Negative for myasthenia gravis, muscular dystrophy, multiple sclerosis or malignant hyperthermia Work History: Retired  Allergies  Ms. Hassan has No Known Allergies.  Laboratory Chemistry Profile   Renal Lab  Results  Component Value Date   BUN 11 01/30/2014   CREATININE 0.72 01/30/2014   GFRAA >90 01/30/2014   GFRNONAA 87 (L) 01/30/2014   PROTEINUR NEGATIVE 01/30/2014     Electrolytes Lab Results  Component Value Date   NA 144 01/30/2014   K 4.2 01/30/2014   CL 105 01/30/2014   CALCIUM 9.5 01/30/2014     Hepatic Lab Results  Component Value Date   AST 24 01/30/2014   ALT 26 01/30/2014   ALBUMIN 4.0 01/30/2014   ALKPHOS 97 01/30/2014     ID Lab Results  Component Value Date   SARSCOV2NAA Not Detected 09/09/2018   STAPHAUREUS NEGATIVE 01/30/2014   MRSAPCR NEGATIVE 01/30/2014     Bone No results found for: [place], [id number], [id number], [id number], 25OHVITD1, 25OHVITD2, 25OHVITD3, TESTOFREE, TESTOSTERONE   Endocrine Lab Results  Component Value Date   GLUCOSE 91 01/30/2014   GLUCOSEU NEGATIVE 01/30/2014     Neuropathy No results found for: VITAMINB12, FOLATE, HGBA1C, HIV  CNS No results found for: COLORCSF, APPEARCSF, RBCCOUNTCSF, WBCCSF, POLYSCSF, LYMPHSCSF, EOSCSF, PROTEINCSF, GLUCCSF, JCVIRUS, CSFOLI, IGGCSF, LABACHR, ACETBL, LABACHR, ACETBL   Inflammation (CRP: Acute   ESR: Chronic) No results found for: CRP, ESRSEDRATE, LATICACIDVEN   Rheumatology No results found for: RF, ANA, LABURIC, URICUR, LYMEIGGIGMAB, LYMEABIGMQN, HLAB27   Coagulation Lab Results  Component Value Date   INR 0.96 01/30/2014   LABPROT 12.9 01/30/2014   APTT 26 01/30/2014   PLT 363 01/30/2014     Cardiovascular Lab Results  Component Value Date   HGB 13.9 01/30/2014   HCT 42.0 01/30/2014     Screening Lab Results  Component Value Date   SARSCOV2NAA Not Detected 09/09/2018   STAPHAUREUS NEGATIVE 01/30/2014   MRSAPCR NEGATIVE 01/30/2014     Cancer No results found for: CEA, CA125, LABCA2   Allergens No results found for: ALMOND, APPLE, ASPARAGUS, AVOCADO, BANANA, BARLEY, BASIL, BAYLEAF, GREENBEAN, LIMABEAN, WHITEBEAN, BEEFIGE, REDBEET, BLUEBERRY, BROCCOLI, CABBAGE, MELON,  CARROT, CASEIN, CASHEWNUT, CAULIFLOWER, CELERY     Note: Lab results reviewed.  PFSH  Drug: Ms. Valenti  reports no history of drug use. Alcohol:  reports current alcohol use of about 2.0 standard drinks of alcohol per week. Tobacco:  reports that she has never smoked. She has never used smokeless tobacco. Medical:  has a past medical history of Cancer ([place]) (12/27/2019), Hypertension, and Stomach ulcer. Family: family history includes Breast cancer in her paternal aunt and paternal aunt.  Past Surgical History:  Procedure Laterality Date   ABDOMINAL HYSTERECTOMY     AUGMENTATION MAMMAPLASTY Bilateral 1987   BREAST ENHANCEMENT SURGERY     BUNIONECTOMY Bilateral    Great toes   CATARACT EXTRACTION Left    colonoscopy     KYPHOPLASTY N/A 02/06/2014   Procedure:  L2 KYPHOPLASTY;  Surgeon: Melina Schools, MD;  Location: [facility];  Service: Orthopedics;  Laterality: N/A;   RADIAL KERATOTOMY     TONSILLECTOMY     UPPER GI ENDOSCOPY     Active Ambulatory Problems    Diagnosis Date Noted   S/P kyphoplasty 02/06/2014   Benign essential hypertension 09/03/2013   Hyperglycemia 12/09/2017   Osteopenia 10/04/2014   Chronic midline thoracic back pain 01/02/2020   Lumbar degenerative disc disease 01/02/2020   Lumbar facet arthropathy 01/02/2020   Chronic pain syndrome 01/02/2020   Resolved Ambulatory Problems    Diagnosis Date Noted   No Resolved Ambulatory Problems   Past Medical History:  Diagnosis Date   Cancer ([place]) 12/27/2019   Hypertension    Stomach ulcer    Constitutional Exam  General appearance: Well nourished, well developed, and well hydrated. In no apparent acute distress Vitals:   01/02/20 1423  BP: 115/62  Pulse: 87  Resp: 16  Temp: (!) 97.1 F (36.2 C)  TempSrc: Temporal  SpO2: 99%  Weight: 208 lb 6.4 oz (94.5 kg)  Height: $Remove'5\' 4"'DgVYVqN$  (1.626 m)   BMI Assessment: Estimated body mass index is 35.77 kg/m as calculated from the following:   Height  as of this encounter: $RemoveBeforeD'5\' 4"'snItInzeEvejAR$  (1.626 m).   Weight as of this encounter: 208 lb 6.4 oz (94.5 kg).  BMI interpretation table: BMI level Category Range association with higher incidence of chronic pain  <18 kg/m2 Underweight   18.5-24.9 kg/m2 Ideal body weight   25-29.9 kg/m2 Overweight Increased incidence by 20%  30-34.9 kg/m2 Obese (Class I) Increased incidence by 68%  35-39.9 kg/m2 Severe obesity (Class II) Increased incidence by 136%  >40 kg/m2 Extreme obesity (Class III) Increased  incidence by 254%   Patient's current BMI Ideal Body weight  Body mass index is 35.77 kg/m. Ideal body weight: 54.7 kg (120 lb 9.5 oz) Adjusted ideal body weight: 70.6 kg (155 lb 11.4 oz)   BMI Readings from Last 4 Encounters:  01/02/20 35.77 kg/m  02/06/14 36.73 kg/m  01/30/14 36.88 kg/m   Wt Readings from Last 4 Encounters:  01/02/20 208 lb 6.4 oz (94.5 kg)  02/06/14 219 lb (99.3 kg)  01/30/14 219 lb 14.4 oz (99.7 kg)    Psych/Mental status: Alert, oriented x 3 (person, place, & time)       Eyes: PERLA Respiratory: No evidence of acute respiratory distress  Cervical Spine Exam  Skin & Axial Inspection: No masses, redness, edema, swelling, or associated skin lesions Alignment: Symmetrical Functional ROM: Unrestricted ROM      Stability: No instability detected Muscle Tone/Strength: Functionally intact. No obvious neuro-muscular anomalies detected. Sensory (Neurological): Unimpaired Palpation: No palpable anomalies              Upper Extremity (UE) Exam    Side: Right upper extremity  Side: Left upper extremity  Skin & Extremity Inspection: Skin color, temperature, and hair growth are WNL. No peripheral edema or cyanosis. No masses, redness, swelling, asymmetry, or associated skin lesions. No contractures.  Skin & Extremity Inspection: Skin color, temperature, and hair growth are WNL. No peripheral edema or cyanosis. No masses, redness, swelling, asymmetry, or associated skin lesions. No  contractures.  Functional ROM: Unrestricted ROM          Functional ROM: Unrestricted ROM          Muscle Tone/Strength: Functionally intact. No obvious neuro-muscular anomalies detected.   Muscle Tone/Strength: Functionally intact. No obvious neuro-muscular anomalies detected.  Sensory (Neurological): Unimpaired          Sensory (Neurological): Unimpaired          Palpation: No palpable anomalies              Palpation: No palpable anomalies              Provocative Test(s):  Phalen's test: deferred Tinel's test: deferred Apley's scratch test (touch opposite shoulder):  Action 1 (Across chest): deferred Action 2 (Overhead): deferred Action 3 (LB reach): deferred   Provocative Test(s):  Phalen's test: deferred Tinel's test: deferred Apley's scratch test (touch opposite shoulder):  Action 1 (Across chest): deferred Action 2 (Overhead): deferred Action 3 (LB reach): deferred    Thoracic Spine Area Exam  Skin & Axial Inspection: No masses, redness, or swelling Alignment: Symmetrical Functional ROM: Pain restricted ROM Stability: No instability detected Muscle Tone/Strength: Functionally intact. No obvious neuro-muscular anomalies detected. Sensory (Neurological): Musculoskeletal pain pattern Muscle strength & Tone: Complains of area being tender to palpation  Lumbar Exam  Skin & Axial Inspection: No masses, redness, or swelling Alignment: Symmetrical Functional ROM: Pain restricted ROM       Stability: No instability detected Muscle Tone/Strength: Functionally intact. No obvious neuro-muscular anomalies detected. Sensory (Neurological): Musculoskeletal pain pattern Palpation: Complains of area being tender to palpation         Gait & Posture Assessment  Ambulation: Patient came in today in a wheel chair Gait: Limited. Using assistive device to ambulate Posture: Difficulty standing up straight, due to pain   Lower Extremity Exam    Side: Right lower extremity  Side: Left  lower extremity  Stability: No instability observed          Stability: No instability observed  Skin & Extremity Inspection: Skin color, temperature, and hair growth are WNL. No peripheral edema or cyanosis. No masses, redness, swelling, asymmetry, or associated skin lesions. No contractures.  Skin & Extremity Inspection: Skin color, temperature, and hair growth are WNL. No peripheral edema or cyanosis. No masses, redness, swelling, asymmetry, or associated skin lesions. No contractures.  Functional ROM: Unrestricted ROM                  Functional ROM: Unrestricted ROM                  Muscle Tone/Strength: Functionally intact. No obvious neuro-muscular anomalies detected.  Muscle Tone/Strength: Functionally intact. No obvious neuro-muscular anomalies detected.  Sensory (Neurological): Unimpaired        Sensory (Neurological): Unimpaired        DTR: Patellar: deferred today Achilles: deferred today Plantar: deferred today  DTR: Patellar: deferred today Achilles: deferred today Plantar: deferred today  Palpation: No palpable anomalies  Palpation: No palpable anomalies   Assessment  Primary Diagnosis & Pertinent Problem List: The primary encounter diagnosis was S/P kyphoplasty (L2 done in 2015 with Dr Rolena Infante). Diagnoses of Chronic midline thoracic back pain, Lumbar degenerative disc disease, Lumbar facet arthropathy, Chronic pain syndrome, Other intervertebral disc degeneration, lumbar region, and Other intervertebral disc degeneration, thoracic region were also pertinent to this visit.  Visit Diagnosis (New problems to examiner): 1. S/P kyphoplasty (L2 done in 2015 with Dr Rolena Infante)   2. Chronic midline thoracic back pain   3. Lumbar degenerative disc disease   4. Lumbar facet arthropathy   5. Chronic pain syndrome   6. Other intervertebral disc degeneration, lumbar region   7. Other intervertebral disc degeneration, thoracic region    Plan of Care (Initial workup plan)  Note:  Ms. Vaca was reminded that as per protocol, today's visit has been an evaluation only. We have not taken over the patient's controlled substance management.  1.  Referral for physical therapy for mid back pain in the context of having a compression fracture status post L2 kyphoplasty 2.  MRI of thoracic and lumbar spine given increased mid back and low back pain that is nonradiating in the context of L2 kyphoplasty over 5 years ago 3.  Start gabapentin as below.   Imaging Orders     MR LUMBAR SPINE WO CONTRAST     MR THORACIC SPINE WO CONTRAST  Referral Orders     Ambulatory referral to Physical Therapy Pharmacotherapy (current): Medications ordered:  Meds ordered this encounter  Medications   gabapentin (NEURONTIN) 100 MG capsule    Sig: Take 1 capsule (100 mg total) by mouth daily for 15 days, THEN 2 capsules (200 mg total) daily for 15 days, THEN 2 capsules (200 mg total) daily.    Dispense:  105 capsule    Refill:  0   Medications administered during this visit: Iliyah L. Wake had no medications administered during this visit.   Pharmacological management options:  Opioid Analgesics: The patient was informed that there is no guarantee that she would be a candidate for opioid analgesics. The decision will be made following CDC guidelines. This decision will be based on the results of diagnostic studies, as well as Ms. Kook's risk profile.   Membrane stabilizer: Start gabapentin as above.  Consider Lyrica, Cymbalta in future  Muscle relaxant: Patient wants to avoid any centrally sedating medications  NSAID: Medically contraindicated history of gastric ulcers  Other analgesic(s): To be determined at a later time  Interventional management options: Ms. Illingworth was informed that there is no guarantee that she would be a candidate for interventional therapies. The decision will be based on the results of diagnostic studies, as well as Ms. Tatem's risk profile.  Procedure(s) under  consideration:  Pending imaging studies   Provider-requested follow-up: Return in about 4 weeks (around 01/30/2020) for in person, After Imaging (T/L Spine), 2nd visit 40 mins.  Future Appointments  Date Time Provider [place]  01/03/2020  2:00 PM Earlie Server, MD [facility] None  01/03/2020  2:45 PM [facility] None    Note by: Gillis Santa, MD Date: 01/02/2020; Time: 3:13 PM

## 2020-01-03 ENCOUNTER — Inpatient Hospital Stay: Payer: Medicare Other | Attending: Oncology | Admitting: Oncology

## 2020-01-03 ENCOUNTER — Inpatient Hospital Stay: Payer: Medicare Other

## 2020-01-03 ENCOUNTER — Encounter: Payer: Self-pay | Admitting: Oncology

## 2020-01-03 VITALS — BP 95/64 | HR 81 | Temp 98.7°F | Resp 18 | Ht 65.75 in | Wt 208.8 lb

## 2020-01-03 DIAGNOSIS — C50912 Malignant neoplasm of unspecified site of left female breast: Secondary | ICD-10-CM

## 2020-01-03 DIAGNOSIS — Z809 Family history of malignant neoplasm, unspecified: Secondary | ICD-10-CM | POA: Diagnosis not present

## 2020-01-03 DIAGNOSIS — Z803 Family history of malignant neoplasm of breast: Secondary | ICD-10-CM

## 2020-01-03 DIAGNOSIS — Z170 Estrogen receptor positive status [ER+]: Secondary | ICD-10-CM

## 2020-01-03 DIAGNOSIS — Z7189 Other specified counseling: Secondary | ICD-10-CM

## 2020-01-03 DIAGNOSIS — R7989 Other specified abnormal findings of blood chemistry: Secondary | ICD-10-CM | POA: Diagnosis not present

## 2020-01-03 DIAGNOSIS — Z801 Family history of malignant neoplasm of trachea, bronchus and lung: Secondary | ICD-10-CM

## 2020-01-03 DIAGNOSIS — C50412 Malignant neoplasm of upper-outer quadrant of left female breast: Secondary | ICD-10-CM | POA: Diagnosis not present

## 2020-01-03 LAB — COMPREHENSIVE METABOLIC PANEL
ALT: 22 U/L (ref 0–44)
AST: 26 U/L (ref 15–41)
Albumin: 4.3 g/dL (ref 3.5–5.0)
Alkaline Phosphatase: 71 U/L (ref 38–126)
Anion gap: 11 (ref 5–15)
BUN: 32 mg/dL — ABNORMAL HIGH (ref 8–23)
CO2: 26 mmol/L (ref 22–32)
Calcium: 9.2 mg/dL (ref 8.9–10.3)
Chloride: 101 mmol/L (ref 98–111)
Creatinine, Ser: 1.58 mg/dL — ABNORMAL HIGH (ref 0.44–1.00)
GFR, Estimated: 34 mL/min — ABNORMAL LOW (ref >60)
Glucose, Bld: 116 mg/dL — ABNORMAL HIGH (ref 70–99)
Potassium: 4 mmol/L (ref 3.5–5.1)
Sodium: 138 mmol/L (ref 135–145)
Total Bilirubin: 0.7 mg/dL (ref 0.3–1.2)
Total Protein: 7.4 g/dL (ref 6.5–8.1)

## 2020-01-03 LAB — CBC WITH DIFFERENTIAL/PLATELET
Abs Immature Granulocytes: 0.05 10*3/uL (ref 0.00–0.07)
Basophils Absolute: 0.1 10*3/uL (ref 0.0–0.1)
Basophils Relative: 1 %
Eosinophils Absolute: 0.2 10*3/uL (ref 0.0–0.5)
Eosinophils Relative: 2 %
HCT: 40.9 % (ref 36.0–46.0)
Hemoglobin: 14 g/dL (ref 12.0–15.0)
Immature Granulocytes: 1 %
Lymphocytes Relative: 16 %
Lymphs Abs: 1.8 10*3/uL (ref 0.7–4.0)
MCH: 30.8 pg (ref 26.0–34.0)
MCHC: 34.2 g/dL (ref 30.0–36.0)
MCV: 89.9 fL (ref 80.0–100.0)
Monocytes Absolute: 0.7 10*3/uL (ref 0.1–1.0)
Monocytes Relative: 6 %
Neutro Abs: 8.3 10*3/uL — ABNORMAL HIGH (ref 1.7–7.7)
Neutrophils Relative %: 74 %
Platelets: 396 10*3/uL (ref 150–400)
RBC: 4.55 MIL/uL (ref 3.87–5.11)
RDW: 12.1 % (ref 11.5–15.5)
WBC: 11 10*3/uL — ABNORMAL HIGH (ref 4.0–10.5)
nRBC: 0 % (ref 0.0–0.2)

## 2020-01-03 NOTE — Progress Notes (Signed)
Hematology/Oncology Consult note [facility] Telephone:([phone] [phone]   Patient Care Team: Juluis Pitch, MD as PCP - General (Family Medicine) Rico Junker, RN as Registered Nurse  REFERRING PROVIDER: Juluis Pitch, MD  CHIEF COMPLAINTS/REASON FOR VISIT:  Evaluation of breast cancer  HISTORY OF PRESENTING ILLNESS:   Tonya Bowen is a  74 y.o.  female with PMH listed below was seen in consultation at the request of  Juluis Pitch, MD  for evaluation of breast cancer  12/04/2019 Screening Mammogram showed left breast mass.  12/19/2019 Unilateral left breast diagnostic mammogram showed  74m irregular left upper outer breast mass, no left axillary lymphadenopathy.  12/27/2019 She underwent biopsy of left breast mass, pathology showed invasive mammary carcinoma, with mucinous features. Grade 2, no DCIS, no LVI, ER 90%+, PR 90%+, HER2 -  Patient was referred to establish care and discuss management plan.  Family history of breast cancer: paternal aunt x 2 breast cancer Family history of other cancers: brother lung cancer, mother possible uterine cancer  Menarche: 89Menopause: hystectomy in 440sAge at first live childbirth:23 Used OCP: about 10 years Used estrogen and progesterone therapy: 20+ years HRT History of Radiation to the chest: none Previous of breast biopsy: none   Review of Systems  Constitutional: Negative for appetite change, chills, fatigue and fever.  HENT:   Negative for hearing loss and voice change.   Eyes: Negative for eye problems.  Respiratory: Negative for chest tightness and cough.   Cardiovascular: Negative for chest pain.  Gastrointestinal: Negative for abdominal distention, abdominal pain and blood in stool.  Endocrine: Negative for hot flashes.  Genitourinary: Negative for difficulty urinating and frequency.   Musculoskeletal: Negative for arthralgias.  Skin: Negative for itching and rash.   Neurological: Negative for extremity weakness.  Hematological: Negative for adenopathy.  Psychiatric/Behavioral: Negative for confusion.    MEDICAL HISTORY:  Past Medical History:  Diagnosis Date  . Cancer ([place] 12/27/2019   left breast  . Chronic back pain   . Hypertension   . Prediabetes   . Stomach ulcer     SURGICAL HISTORY: Past Surgical History:  Procedure Laterality Date  . ABDOMINAL HYSTERECTOMY    . AUGMENTATION MAMMAPLASTY Bilateral 1987  . BREAST ENHANCEMENT SURGERY    . BUNIONECTOMY Bilateral    Great toes  . CATARACT EXTRACTION Left   . colonoscopy    . KYPHOPLASTY N/A 02/06/2014   Procedure:  L2 KYPHOPLASTY;  Surgeon: DMelina Schools MD;  [place] [facility]  Service: Orthopedics;  Laterality: N/A;  . RADIAL KERATOTOMY    . TONSILLECTOMY    . UPPER GI ENDOSCOPY      SOCIAL HISTORY: Social History   Socioeconomic History  . Marital status: Divorced    Spouse name: Not on file  . Number of children: Not on file  . Years of education: Not on file  . Highest education level: Not on file  Occupational History  . Not on file  Tobacco Use  . Smoking status: Never Smoker  . Smokeless tobacco: Never Used  Vaping Use  . Vaping Use: Never used  Substance and Sexual Activity  . Alcohol use: Yes    Alcohol/week: 2.0 standard drinks    Types: 2 Glasses of wine per week    Comment: socially  . Drug use: No  . Sexual activity: Not Currently  Other Topics Concern  . Not on file  Social History Narrative  . Not on file   Social Determinants  of Health   Financial Resource Strain:   . Difficulty of Paying Living Expenses: Not on file  Food Insecurity:   . Worried About Charity fundraiser in the Last Year: Not on file  . Ran Out of Food in the Last Year: Not on file  Transportation Needs:   . Lack of Transportation (Medical): Not on file  . Lack of Transportation (Non-Medical): Not on file  Physical Activity:   . Days of Exercise per Week: Not on file   . Minutes of Exercise per Session: Not on file  Stress:   . Feeling of Stress : Not on file  Social Connections:   . Frequency of Communication with Friends and Family: Not on file  . Frequency of Social Gatherings with Friends and Family: Not on file  . Attends Religious Services: Not on file  . Active Member of Clubs or Organizations: Not on file  . Attends Archivist Meetings: Not on file  . Marital Status: Not on file  Intimate Partner Violence:   . Fear of Current or Ex-Partner: Not on file  . Emotionally Abused: Not on file  . Physically Abused: Not on file  . Sexually Abused: Not on file    FAMILY HISTORY: Family History  Problem Relation Age of Onset  . Breast cancer Paternal Aunt   . Breast cancer Paternal Aunt   . Uterine cancer Mother        probable  . Lung cancer Brother     ALLERGIES:  has No Known Allergies.  MEDICATIONS:  Current Outpatient Medications  Medication Sig Dispense Refill  . acetaminophen (TYLENOL) 325 MG tablet Take 650 mg by mouth every 6 (six) hours as needed.    Marland Kitchen amLODipine (NORVASC) 5 MG tablet Take 5 mg by mouth every evening.   1  . calcium-vitamin D (OSCAL WITH D) 250-125 MG-UNIT tablet Take 1 tablet by mouth daily.    . cetirizine (ZYRTEC) 10 MG tablet Take 10 mg by mouth daily.    Marland Kitchen gabapentin (NEURONTIN) 100 MG capsule Take 1 capsule (100 mg total) by mouth daily for 15 days, THEN 2 capsules (200 mg total) daily for 15 days, THEN 3 capsules (300 mg total) daily. 135 capsule 0  . lisinopril-hydrochlorothiazide (ZESTORETIC) 20-12.5 MG tablet Take 1 tablet by mouth daily.    Marland Kitchen omeprazole (PRILOSEC) 40 MG capsule Take 1 tablet by mouth daily.    . prednisoLONE acetate (PRED FORTE) 1 % ophthalmic suspension Place 1 drop into the left eye See admin instructions. Tapering dose 3 times a day for 1 week, then 2 times a day for 1 week, then daily for 1 week  0  . Semaglutide (OZEMPIC, 0.25 OR 0.5 MG/DOSE, [place]) Inject into the skin once  a week.    . Vitamin D, Ergocalciferol, (DRISDOL) 1.25 MG (50000 UT) CAPS capsule Take by mouth every 7 (seven) days.     . meloxicam (MOBIC) 15 MG tablet TAKE 1 TABLET BY MOUTH EVERY DAY (Patient not taking: Reported on 01/02/2020) 30 tablet 0   No current facility-administered medications for this visit.     PHYSICAL EXAMINATION: ECOG PERFORMANCE STATUS: 0 - Asymptomatic Vitals:   01/03/20 1414  BP: 95/64  Pulse: 81  Resp: 18  Temp: 98.7 F (37.1 C)   Filed Weights   01/03/20 1414  Weight: 208 lb 12.8 oz (94.7 kg)    Physical Exam Constitutional:      General: She is not in acute distress. HENT:  Head: Normocephalic and atraumatic.  Eyes:     General: No scleral icterus. Cardiovascular:     Rate and Rhythm: Normal rate and regular rhythm.     Heart sounds: Normal heart sounds.  Pulmonary:     Effort: Pulmonary effort is normal. No respiratory distress.     Breath sounds: No wheezing.  Abdominal:     General: Bowel sounds are normal. There is no distension.     Palpations: Abdomen is soft.  Musculoskeletal:        General: No deformity. Normal range of motion.     Cervical back: Normal range of motion and neck supple.  Skin:    General: Skin is warm and dry.     Findings: No erythema or rash.  Neurological:     Mental Status: She is alert and oriented to person, place, and time. Mental status is at baseline.     Cranial Nerves: No cranial nerve deficit.     Coordination: Coordination normal.  Psychiatric:        Mood and Affect: Mood normal.   Breast exam was performed in seated and lying down position. Patient is status post left breast mass biopsy with bruising around the biopsy site. No palpable bilateral axillary lymphadenopathy. No palpable mass in right breast.    LABORATORY DATA:  I have reviewed the data as listed Lab Results  Component Value Date   WBC 11.0 (H) 01/03/2020   HGB 14.0 01/03/2020   HCT 40.9 01/03/2020   MCV 89.9 01/03/2020    PLT 396 01/03/2020   Recent Labs    01/03/20 1505  NA 138  K 4.0  CL 101  CO2 26  GLUCOSE 116*  BUN 32*  CREATININE 1.58*  CALCIUM 9.2  GFRNONAA 34*  PROT 7.4  ALBUMIN 4.3  AST 26  ALT 22  ALKPHOS 71  BILITOT 0.7   Iron/TIBC/Ferritin/ %Sat No results found for: IRON, TIBC, FERRITIN, IRONPCTSAT    RADIOGRAPHIC STUDIES: I have personally reviewed the radiological images as listed and agreed with the findings in the report. US BREAST LTD UNI LEFT INC AXILLA  Result Date: 12/19/2019 CLINICAL DATA:  Callback for LEFT breast mass EXAM: DIGITAL DIAGNOSTIC UNILATERAL LEFT MAMMOGRAM WITH TOMO AND CAD; ULTRASOUND LEFT BREAST LIMITED COMPARISON:  Previous exam(s). ACR Breast Density Category b: There are scattered areas of fibroglandular density. FINDINGS: In the LEFT upper outer breast on displaced tomosynthesis spot views, there is redemonstration of a irregular mass with indistinct margins. There is associated architectural distortion. Mass spans approximately 2.4 cm. Mammographic images were processed with CAD. On physical exam, there is a hard mass in the LEFT upper outer breast. Implants are noted. Targeted ultrasound was performed of the LEFT upper outer breast. At 1230 4 cm from the nipple there is an irregular hypoechoic mass with irregular margins. It measures 22 x 18 x 7 mm. Targeted ultrasound was performed of the LEFT axilla. No suspicious lymph nodes are seen. IMPRESSION: 1. There is a 22 mm irregular mass in the LEFT upper outer breast which is concerning for malignancy. Recommend ultrasound-guided biopsy. 2. No suspicious LEFT axillary lymph nodes. RECOMMENDATION: Ultrasound-guided biopsy of the LEFT breast x 1. I have discussed the findings and recommendations with the patient. If applicable, a reminder letter will be sent to the patient regarding the next appointment. Patient will be scheduled for biopsy at her earliest convenience by the schedulers and ordering provider will be  notified. BI-RADS CATEGORY  5: Highly suggestive of malignancy. Electronically  Signed   By: Valentino Saxon MD   On: 12/19/2019 15:09   MM DIAG BREAST TOMO UNI LEFT  Result Date: 12/19/2019 CLINICAL DATA:  Callback for LEFT breast mass EXAM: DIGITAL DIAGNOSTIC UNILATERAL LEFT MAMMOGRAM WITH TOMO AND CAD; ULTRASOUND LEFT BREAST LIMITED COMPARISON:  Previous exam(s). ACR Breast Density Category b: There are scattered areas of fibroglandular density. FINDINGS: In the LEFT upper outer breast on displaced tomosynthesis spot views, there is redemonstration of a irregular mass with indistinct margins. There is associated architectural distortion. Mass spans approximately 2.4 cm. Mammographic images were processed with CAD. On physical exam, there is a hard mass in the LEFT upper outer breast. Implants are noted. Targeted ultrasound was performed of the LEFT upper outer breast. At 1230 4 cm from the nipple there is an irregular hypoechoic mass with irregular margins. It measures 22 x 18 x 7 mm. Targeted ultrasound was performed of the LEFT axilla. No suspicious lymph nodes are seen. IMPRESSION: 1. There is a 22 mm irregular mass in the LEFT upper outer breast which is concerning for malignancy. Recommend ultrasound-guided biopsy. 2. No suspicious LEFT axillary lymph nodes. RECOMMENDATION: Ultrasound-guided biopsy of the LEFT breast x 1. I have discussed the findings and recommendations with the patient. If applicable, a reminder letter will be sent to the patient regarding the next appointment. Patient will be scheduled for biopsy at her earliest convenience by the schedulers and ordering provider will be notified. BI-RADS CATEGORY  5: Highly suggestive of malignancy. Electronically Signed   By: Valentino Saxon MD   On: 12/19/2019 15:09   MM CLIP PLACEMENT LEFT  Result Date: 12/27/2019 CLINICAL DATA:  Evaluate Q shaped biopsy clip placement following ultrasound-guided LEFT breast biopsy. EXAM: DIAGNOSTIC LEFT  MAMMOGRAM POST ULTRASOUND BIOPSY COMPARISON:  Previous exam(s). FINDINGS: Mammographic images were obtained following ultrasound guided biopsy of the 2.2 cm mass at the 12:30 position of the LEFT breast. The Q shaped biopsy marking clip is in expected position at the site of biopsy. IMPRESSION: Appropriate positioning of the Q shaped biopsy marking clip at the site of biopsy in the UPPER OUTER LEFT breast. Final Assessment: Post Procedure Mammograms for Marker Placement Electronically Signed   By: Margarette Canada M.D.   On: 12/27/2019 13:58   [place] LT BREAST BX W LOC DEV 1ST LESION IMG BX SPEC US GUIDE  Addendum Date: 12/28/2019   ADDENDUM REPORT: 12/28/2019 14:11 ADDENDUM: PATHOLOGY revealed: A. LEFT BREAST, 12:30 4CMFN; ULTRASOUND-GUIDED BIOPSY: - INVASIVE MAMMARY CARCINOMA WITH MUCINOUS FEATURES. Size of invasive carcinoma: 8 mm in this sample. Grade 2. Ductal carcinoma in situ: Not identified. Lymphovascular invasion: Not identified. Pathology results are CONCORDANT with imaging findings, per Dr. Hassan Rowan. Pathology results and recommendations below were discussed with patient by telephone on 12/28/2019. Patient reported biopsy site within normal limits with slight tenderness at the site. Post biopsy care instructions were reviewed, questions were answered and my direct phone number was provided to patient. Patient was instructed to call [facility] if any concerns or questions arise related to the biopsy. Recommend surgical consultation. Request for surgical consultation relayed to Al Pimple RN and Tanya Nones RN at [facility] by Electa Sniff RN on 12/28/2019. Pathology results reported by Electa Sniff RN on 12/28/2019. Electronically Signed   By: Margarette Canada M.D.   On: 12/28/2019 14:11   Result Date: 12/28/2019 CLINICAL DATA:  74 year old female for ultrasound-guided biopsy of UPPER-OUTER LEFT breast mass. EXAM: ULTRASOUND GUIDED LEFT BREAST CORE NEEDLE BIOPSY  COMPARISON:   Previous exam(s). PROCEDURE: I met with the patient and we discussed the procedure of ultrasound-guided biopsy, including benefits and alternatives. We discussed the high likelihood of a successful procedure. We discussed the risks of the procedure, including implant damage, infection, bleeding, tissue injury, clip migration, and inadequate sampling. Informed written consent was given. The usual time-out protocol was performed immediately prior to the procedure. Lesion quadrant: UPPER-OUTER LEFT breast Using sterile technique and 1% Lidocaine as local anesthetic, under direct ultrasound visualization, a 12 gauge spring-loaded device was used to perform biopsy of the 2.2 cm mass at the 12:30 position of the LEFT breast using a MEDIAL approach. Mild hemorrhage developed immediately after local anesthetic administration. At the conclusion of the procedure a Q shaped tissue marker clip was deployed into the biopsy cavity. Follow up 2 view mammogram was performed and dictated separately. A Quik-clot device was applied at the biopsy entry site. IMPRESSION: Ultrasound guided biopsy of 2.2 cm UPPER-OUTER LEFT breast mass. Mild post biopsy hematoma. Electronically Signed: By: Margarette Canada M.D. On: 12/27/2019 13:56      ASSESSMENT & PLAN:  1. Malignant neoplasm of upper-outer quadrant of left breast in female, estrogen receptor positive ([place])   2. Family history of breast cancer   3. Goals of care, counseling/discussion   4. Elevated serum creatinine   Cancer Staging Malignant neoplasm of upper-outer quadrant of left breast in female, estrogen receptor positive ([place]) Staging form: Breast, AJCC 8th Edition - Clinical: Stage IB (cT2, cN0, cM0, G2, ER+, PR+, HER2-) - Signed by Earlie Server, MD on 01/03/2020  Mammogram images and pathology were reviewed and discussed with patient.  Clinical stage IB left breast cancer with mucinous feature, ER/PR+, HER2- Recommend surgery. She has an appointment with Dr.Byrnett this  afternoon.  Discussed with her about adjuvant Radiation and endocrine therapy.   Family history of breast cancer, refer to genetic counselor. She agrees.  If genetic testing results may change her plan of surgery, lumpectomy vs mastectomy, I recommend her to wait for test results before surgery. If test results will not change her plan, then she may proceed with surgery with pending testing results.   Check CBC and CMP.   # Elevated creatinine, low BP, possible dehydration.   this is much higher than her baseline. advise pt to increase oral hydration, avoid nephrotoxins. Repeat bmp in 1 week.   Orders Placed This Encounter  Procedures  . CBC with Differential/Platelet    Standing Status:   Future    Number of Occurrences:   1    Standing Expiration Date:   01/02/2021  . Comprehensive metabolic panel    Standing Status:   Future    Number of Occurrences:   1    Standing Expiration Date:   01/02/2021  . Ambulatory referral to Genetics    Referral Priority:   Routine    Referral Type:   Consultation    Referral Reason:   Specialty Services Required    Number of Visits Requested:   1    All questions were answered. The patient knows to call the clinic with any problems questions or concerns.  cc Juluis Pitch, MD    Return of visit: to be determined. I will see her 2 weeks after surgery Thank you for this kind referral and the opportunity to participate in the care of this patient. A copy of today's note is routed to referring provider    Earlie Server, MD, PhD Hematology [facility] [place]- [phone] 01/03/2020

## 2020-01-03 NOTE — Progress Notes (Signed)
New patient evaluation.   

## 2020-01-04 ENCOUNTER — Telehealth: Payer: Self-pay

## 2020-01-04 DIAGNOSIS — C50412 Malignant neoplasm of upper-outer quadrant of left female breast: Secondary | ICD-10-CM

## 2020-01-04 DIAGNOSIS — Z170 Estrogen receptor positive status [ER+]: Secondary | ICD-10-CM

## 2020-01-04 NOTE — Telephone Encounter (Signed)
Patient notified of MD recommendation.

## 2020-01-04 NOTE — Telephone Encounter (Signed)
-----   Message from Earlie Server, MD sent at 01/03/2020 11:16 PM EDT ----- Let her know that kidney function is low, recommend her to increase oral hydration, avoid NSAIDs, and repeat BMP in 1 week. Please order and arrange. Thanks

## 2020-01-07 NOTE — Telephone Encounter (Signed)
Yes, 10/29 @ 11:45

## 2020-01-07 NOTE — Telephone Encounter (Signed)
Done.. 01/11/20 @ 1145 correct? Pt has been sched as requested

## 2020-01-10 ENCOUNTER — Encounter: Payer: Self-pay | Admitting: *Deleted

## 2020-01-10 NOTE — Progress Notes (Addendum)
Wanted to follow up with patient post surgical and medical oncology consults.  No answer.  Left message for patient to return my call.  Patient returned my call.  She is doing well.  States she will decide on what type of surgery she wants after her genetic testing results.  States she found out she has a large family history of breast cancer on her paternal side of the family.  She has an appointment with Faith Rogue our genetic counselor on Monday.  She is to call with any questions or needs.

## 2020-01-11 ENCOUNTER — Other Ambulatory Visit: Payer: Self-pay

## 2020-01-11 ENCOUNTER — Inpatient Hospital Stay: Payer: Medicare Other

## 2020-01-11 DIAGNOSIS — C50412 Malignant neoplasm of upper-outer quadrant of left female breast: Secondary | ICD-10-CM | POA: Diagnosis not present

## 2020-01-11 LAB — BASIC METABOLIC PANEL
Anion gap: 7 (ref 5–15)
BUN: 32 mg/dL — ABNORMAL HIGH (ref 8–23)
CO2: 29 mmol/L (ref 22–32)
Calcium: 9.5 mg/dL (ref 8.9–10.3)
Chloride: 101 mmol/L (ref 98–111)
Creatinine, Ser: 1.39 mg/dL — ABNORMAL HIGH (ref 0.44–1.00)
GFR, Estimated: 40 mL/min — ABNORMAL LOW (ref >60)
Glucose, Bld: 118 mg/dL — ABNORMAL HIGH (ref 70–99)
Potassium: 4.2 mmol/L (ref 3.5–5.1)
Sodium: 137 mmol/L (ref 135–145)

## 2020-01-14 ENCOUNTER — Encounter: Payer: Self-pay | Admitting: Licensed Clinical Social Worker

## 2020-01-14 ENCOUNTER — Inpatient Hospital Stay: Payer: Medicare Other

## 2020-01-14 ENCOUNTER — Other Ambulatory Visit: Payer: Self-pay

## 2020-01-14 ENCOUNTER — Inpatient Hospital Stay: Payer: Medicare Other | Attending: Oncology | Admitting: Licensed Clinical Social Worker

## 2020-01-14 DIAGNOSIS — Z803 Family history of malignant neoplasm of breast: Secondary | ICD-10-CM

## 2020-01-14 DIAGNOSIS — Z801 Family history of malignant neoplasm of trachea, bronchus and lung: Secondary | ICD-10-CM | POA: Diagnosis not present

## 2020-01-14 DIAGNOSIS — Z170 Estrogen receptor positive status [ER+]: Secondary | ICD-10-CM

## 2020-01-14 DIAGNOSIS — C50412 Malignant neoplasm of upper-outer quadrant of left female breast: Secondary | ICD-10-CM

## 2020-01-14 DIAGNOSIS — Z315 Encounter for genetic counseling: Secondary | ICD-10-CM

## 2020-01-14 DIAGNOSIS — Z8049 Family history of malignant neoplasm of other genital organs: Secondary | ICD-10-CM

## 2020-01-14 HISTORY — PX: BREAST LUMPECTOMY: SHX2

## 2020-01-14 NOTE — Progress Notes (Signed)
REFERRING PROVIDER: Earlie Server, MD [place],  [place] [address]  PRIMARY PROVIDER:  Juluis Pitch, MD  PRIMARY REASON FOR VISIT:  1. Family history of breast cancer   2. Family history of uterine cancer   3. Family history of lung cancer   4. Malignant neoplasm of upper-outer quadrant of left breast in female, estrogen receptor positive ([place])      HISTORY OF PRESENT ILLNESS:   Tonya Bowen, a 74 y.o. female, was seen for a [facility] cancer genetics consultation at the request of Dr. Tasia Catchings due to a personal and family history of breast cancer.  Tonya Bowen presents to clinic today to discuss the possibility of a hereditary predisposition to cancer, genetic testing, and to further clarify her future cancer risks, as well as potential cancer risks for family members.   In 2021, at the age of 83, Tonya Bowen was diagnosed with invasive mammary carcinoma of the left breast, ER/PR+, Her2-. The treatment plan includes surgery, she would like to use genetic test results to help make a surgical decision.   CANCER HISTORY:  Oncology History  Malignant neoplasm of upper-outer quadrant of left breast in female, estrogen receptor positive ([facility])  01/03/2020 Initial Diagnosis   Malignant neoplasm of upper-outer quadrant of left breast in female, estrogen receptor positive ([facility])   01/03/2020 Cancer Staging   Staging form: Breast, AJCC 8th Edition - Clinical: Stage IB (cT2, cN0, cM0, G2, ER+, PR+, HER2-) - Signed by Earlie Server, MD on 01/03/2020      RISK FACTORS:  Menarche was at age 65.  First live birth at age 85.  OCP use for approximately 10 years.  Ovaries intact: yes.  Hysterectomy: yes.  Menopausal status: postmenopausal.  HRT use: 20 years. Colonoscopy: yes; normal. Mammogram within the last year: yes. Number of breast biopsies: 1.   Past Medical History:  Diagnosis Date  . Cancer ([facility]) 12/27/2019   left breast  . Chronic back pain   . Family history of breast cancer     . Family history of lung cancer   . Family history of uterine cancer   . Hypertension   . Prediabetes   . Stomach ulcer     Past Surgical History:  Procedure Laterality Date  . ABDOMINAL HYSTERECTOMY    . AUGMENTATION MAMMAPLASTY Bilateral 1987  . BREAST ENHANCEMENT SURGERY    . BUNIONECTOMY Bilateral    Great toes  . CATARACT EXTRACTION Left   . colonoscopy    . KYPHOPLASTY N/A 02/06/2014   Procedure:  L2 KYPHOPLASTY;  Surgeon: Melina Schools, MD;  Location: [facility];  Service: Orthopedics;  Laterality: N/A;  . RADIAL KERATOTOMY    . TONSILLECTOMY    . UPPER GI ENDOSCOPY      Social History   Socioeconomic History  . Marital status: Divorced    Spouse name: Not on file  . Number of children: Not on file  . Years of education: Not on file  . Highest education level: Not on file  Occupational History  . Not on file  Tobacco Use  . Smoking status: Never Smoker  . Smokeless tobacco: Never Used  Vaping Use  . Vaping Use: Never used  Substance and Sexual Activity  . Alcohol use: Yes    Alcohol/week: 2.0 standard drinks    Types: 2 Glasses of wine per week    Comment: socially  . Drug use: No  . Sexual activity: Not Currently  Other Topics Concern  . Not on  file  Social History Narrative  . Not on file   Social Determinants of Health   Financial Resource Strain:   . Difficulty of Paying Living Expenses: Not on file  Food Insecurity:   . Worried About Charity fundraiser in the Last Year: Not on file  . Ran Out of Food in the Last Year: Not on file  Transportation Needs:   . Lack of Transportation (Medical): Not on file  . Lack of Transportation (Non-Medical): Not on file  Physical Activity:   . Days of Exercise per Week: Not on file  . Minutes of Exercise per Session: Not on file  Stress:   . Feeling of Stress : Not on file  Social Connections:   . Frequency of Communication with Friends and Family: Not on file  . Frequency of Social Gatherings with Friends  and Family: Not on file  . Attends Religious Services: Not on file  . Active Member of Clubs or Organizations: Not on file  . Attends Archivist Meetings: Not on file  . Marital Status: Not on file     FAMILY HISTORY:  We obtained a detailed, 4-generation family history.  Significant diagnoses are listed below: Family History  Problem Relation Age of Onset  . Breast cancer Paternal Aunt   . Breast cancer Paternal Aunt   . Uterine cancer Mother        probable  . Lung cancer Brother        metastatic disease; unsure if lung was pimary  . Breast cancer Paternal Aunt   . ALS Daughter   . Breast cancer Cousin        dx 59s  . Breast cancer Cousin        dx 33s  . Breast cancer Cousin        dx 67s  . Lung cancer Cousin    Tonya Bowen has a daughter, 68 and a son, 57. Her daughter has ALS and would like for me to look into genetic testing options for this condition as her granddaughter is concerned. Tonya Bowen had a brother and a sister. Her brother died at 77 and had metastatic cancer that possibly started in the lung.   Tonya Bowen mother either had uterine cancer or something pre-cancerous around age 104, she passed at 4. Patient had 7 maternal aunts/uncles, no cancers. At least two maternal cousins had lung cancer and history of smoking. Maternal grandparents passed in their 29s.   Tonya Bowen father died at 65. Patient had 4 paternal aunts, 4 paternal uncles. Three aunts had breast cancer, unknown ages. Three paternal cousins also had breast cancer, one diagnosed in her 69s, another in her 12s and the other in her 60s-70s. Another paternal cousin had lung cancer, no history of smoking. Paternal grandparents passed in their 39s.  Tonya Bowen is unaware of previous family history of genetic testing for hereditary cancer risks. Patient's maternal ancestors are of German/Irish descent, and paternal ancestors are of German/Irish/American [place] descent. There is no reported Ashkenazi  Jewish ancestry. There is no known consanguinity.   GENETIC COUNSELING ASSESSMENT: Tonya Bowen is a 74 y.o. female with a personal and family history of breast cancer which is somewhat suggestive of a hereditary cancer syndrome and predisposition to cancer. We, therefore, discussed and recommended the following at today's visit.   DISCUSSION: We discussed that approximately 5-10% of breast cancer is hereditary  Most cases of hereditary breast cancer are associated with BRCA1/BRCA2 genes, although there  are other genes associated with hereditary breast cancer as well including PALB2, ATM, CHEK2 . We discussed that testing is beneficial for several reasons including surgical decision-making for breast cancer, knowing about other cancer risks, identifying potential screening and risk-reduction options that may be appropriate, and to understand if other family members could be at risk for cancer and allow them to undergo genetic testing.   We reviewed the characteristics, features and inheritance patterns of hereditary cancer syndromes. We also discussed genetic testing, including the appropriate family members to test, the process of testing, insurance coverage and turn-around-time for results. We discussed the implications of a negative, positive and/or variant of uncertain significant result. In order to get genetic test results in a timely manner so that Tonya Bowen can use these genetic test results for surgical decisions, we recommended Tonya Bowen pursue genetic testing for the [facility] Breast Cancer STAT Panel. Once complete, we recommend Tonya Bowen pursue reflex genetic testing to the Common Hereditary Cancers gene panel.   Based on Tonya Bowen's personal and family history of cancer, she meets medical criteria for genetic testing. Despite that she meets criteria, she may still have an out of pocket cost.   PLAN: After considering the risks, benefits, and limitations, Tonya Bowen provided informed consent to  pursue genetic testing and the blood sample was sent to [facility] for analysis of the [facility] Breast Cancer STAT Panel. Results should be available within approximately 1 weeks' time, at which point they will be disclosed by telephone to Tonya Bowen, as will any additional recommendations warranted by these results. Tonya Bowen will receive a summary of her genetic counseling visit and a copy of her results once available. This information will also be available in Epic.   Based on Tonya Bowen's family history, we recommended her paternal relatives have genetic counseling and testing. Tonya Bowen will let us know if we can be of any assistance in coordinating genetic counseling and/or testing for this family member.   Lastly, we encouraged Tonya Bowen. Meroney to remain in contact with cancer genetics annually so that we can continuously update the family history and inform her of any changes in cancer genetics and testing that may be of benefit for this family.   Tonya Bowen. Mcculloh questions were answered to her satisfaction today. Our contact information was provided should additional questions or concerns arise. Thank you for the referral and allowing [place] to share in the care of your patient.   Faith Rogue, Tonya Bowen, [facility] Genetic Counselor [email or web address] Phone: [phone]  The patient was seen for a total of 30 minutes in face-to-face genetic counseling.  Dr. Grayland Ormond was available for discussion regarding this case.   _______________________________________________________________________ For Office Staff:  Number of people involved in session: 1 Was an Intern/ student involved with case: no

## 2020-01-15 ENCOUNTER — Ambulatory Visit: Payer: Medicare Other

## 2020-01-17 ENCOUNTER — Telehealth: Payer: Self-pay

## 2020-01-17 NOTE — Telephone Encounter (Signed)
Patient  notified MD recommendations via MyChart per patient's request.

## 2020-01-17 NOTE — Telephone Encounter (Signed)
Left message to call for results.  Also sent a MyChart message informing patient we have results.

## 2020-01-17 NOTE — Telephone Encounter (Signed)
-----   Message from Earlie Server, MD sent at 01/16/2020 10:28 PM EDT ----- Please let her know that her kidney function is better, however still lower than her baseline level in August. Encourage hydration. Recommend her to avoid NSAIDS including mobic.

## 2020-01-22 ENCOUNTER — Ambulatory Visit
Admission: RE | Admit: 2020-01-22 | Discharge: 2020-01-22 | Disposition: A | Discharge status: Home / Self Care | Payer: Medicare Other | Source: Ambulatory Visit | Attending: Student in an Organized Health Care Education/Training Program | Admitting: Student in an Organized Health Care Education/Training Program

## 2020-01-22 ENCOUNTER — Other Ambulatory Visit: Payer: Self-pay

## 2020-01-22 ENCOUNTER — Telehealth: Payer: Self-pay | Admitting: Licensed Clinical Social Worker

## 2020-01-22 ENCOUNTER — Ambulatory Visit: Payer: Self-pay | Admitting: Licensed Clinical Social Worker

## 2020-01-22 ENCOUNTER — Encounter: Payer: Self-pay | Admitting: Licensed Clinical Social Worker

## 2020-01-22 DIAGNOSIS — M5134 Other intervertebral disc degeneration, thoracic region: Secondary | ICD-10-CM | POA: Insufficient documentation

## 2020-01-22 DIAGNOSIS — M5136 Other intervertebral disc degeneration, lumbar region: Secondary | ICD-10-CM

## 2020-01-22 DIAGNOSIS — Z1379 Encounter for other screening for genetic and chromosomal anomalies: Secondary | ICD-10-CM

## 2020-01-22 DIAGNOSIS — Z8049 Family history of malignant neoplasm of other genital organs: Secondary | ICD-10-CM

## 2020-01-22 DIAGNOSIS — Z170 Estrogen receptor positive status [ER+]: Secondary | ICD-10-CM

## 2020-01-22 DIAGNOSIS — Z801 Family history of malignant neoplasm of trachea, bronchus and lung: Secondary | ICD-10-CM

## 2020-01-22 DIAGNOSIS — Z803 Family history of malignant neoplasm of breast: Secondary | ICD-10-CM

## 2020-01-22 NOTE — Progress Notes (Signed)
HPI:  Tonya Bowen was previously seen in the [facility] due to a personal and family history of cancer and concerns regarding a hereditary predisposition to cancer. Please refer to our prior cancer genetics clinic note for more information regarding our discussion, assessment and recommendations, at the time. Tonya Bowen recent genetic test results were disclosed to her, as were recommendations warranted by these results. These results and recommendations are discussed in more detail below.  CANCER HISTORY:  Oncology History  Malignant neoplasm of upper-outer quadrant of left breast in female, estrogen receptor positive ([place])  01/03/2020 Initial Diagnosis   Malignant neoplasm of upper-outer quadrant of left breast in female, estrogen receptor positive ([facility])   01/03/2020 Cancer Staging   Staging form: Breast, AJCC 8th Edition - Clinical: Stage IB (cT2, cN0, cM0, G2, ER+, PR+, HER2-) - Signed by Earlie Server, MD on 01/03/2020    Genetic Testing   Negative genetic testing. No pathogenic variants identified on the [facility] Common Hereditary Cancers Panel +EGFR. The report date is 01/21/2020.  The Common Hereditary Cancers Panel offered by [facility] includes sequencing and/or deletion duplication testing of the following 48 genes: APC, ATM, AXIN2, BARD1, BMPR1A, BRCA1, BRCA2, BRIP1, CDH1, CDKN2A (p14ARF), CDKN2A (p16INK4a), CKD4, CHEK2, CTNNA1, DICER1, EGFR,  EPCAM (Deletion/duplication testing only), GREM1 (promoter region deletion/duplication testing only), KIT, MEN1, MLH1, MSH2, MSH3, MSH6, MUTYH, NBN, NF1, NHTL1, PALB2, PDGFRA, PMS2, POLD1, POLE, PTEN, RAD50, RAD51C, RAD51D, RNF43, SDHB, SDHC, SDHD, SMAD4, SMARCA4. STK11, TP53, TSC1, TSC2, and VHL.  The following genes were evaluated for sequence changes only: SDHA and HOXB13 c.251G>A variant only.     FAMILY HISTORY:  We obtained a detailed, 4-generation family history.  Significant diagnoses are listed below: Family History    Problem Relation Age of Onset  . Breast cancer Paternal Aunt   . Breast cancer Paternal Aunt   . Uterine cancer Mother        probable  . Lung cancer Brother        metastatic disease; unsure if lung was pimary  . Breast cancer Paternal Aunt   . ALS Daughter   . Breast cancer Cousin        dx 80s  . Breast cancer Cousin        dx 31s  . Breast cancer Cousin        dx 62s  . Lung cancer Cousin    Tonya Bowen has a daughter, 45 and a son, 74. Her daughter has ALS and would like for me to look into genetic testing options for this condition as her granddaughter is concerned. Tonya Bowen had a brother and a sister. Her brother died at 45 and had metastatic cancer that possibly started in the lung.   Tonya Bowen mother either had uterine cancer or something pre-cancerous around age 64, she passed at 58. Patient had 7 maternal aunts/uncles, no cancers. At least two maternal cousins had lung cancer and history of smoking. Maternal grandparents passed in their 32s.   Tonya Bowen father died at 35. Patient had 4 paternal aunts, 4 paternal uncles. Three aunts had breast cancer, unknown ages. Three paternal cousins also had breast cancer, one diagnosed in her 52s, another in her 42s and the other in her 37s-70s. Another paternal cousin had lung cancer, no history of smoking. Paternal grandparents passed in their 34s.  Tonya Bowen is unaware of previous family history of genetic testing for hereditary cancer risks. Patient's maternal ancestors are of German/Irish descent, and paternal ancestors  are of German/Irish/American [place] descent. There is no reported Ashkenazi Jewish ancestry. There is no known consanguinity.     GENETIC TEST RESULTS: Genetic testing reported out on 01/21/2020 through the [facility] Common Hereditary cancer panel found no pathogenic mutations.   The Common Hereditary Cancers Panel offered by [facility] includes sequencing and/or deletion duplication testing of the following 48  genes: APC, ATM, AXIN2, BARD1, BMPR1A, BRCA1, BRCA2, BRIP1, CDH1, CDKN2A (p14ARF), CDKN2A (p16INK4a), CKD4, CHEK2, CTNNA1, DICER1, EGFR, EPCAM (Deletion/duplication testing only), GREM1 (promoter region deletion/duplication testing only), KIT, MEN1, MLH1, MSH2, MSH3, MSH6, MUTYH, NBN, NF1, NHTL1, PALB2, PDGFRA, PMS2, POLD1, POLE, PTEN, RAD50, RAD51C, RAD51D, RNF43, SDHB, SDHC, SDHD, SMAD4, SMARCA4. STK11, TP53, TSC1, TSC2, and VHL.  The following genes were evaluated for sequence changes only: SDHA and HOXB13 c.251G>A variant only.   The test report has been scanned into EPIC and is located under the Molecular Pathology section of the Results Review tab.  A portion of the result report is included below for reference.     We discussed with Ms. Haverstick that because current genetic testing is not perfect, it is possible there may be a gene mutation in one of these genes that current testing cannot detect, but that chance is small.  We also discussed, that there could be another gene that has not yet been discovered, or that we have not yet tested, that is responsible for the cancer diagnoses in the family. It is also possible there is a hereditary cause for the cancer in the family that Ms. Benningfield did not inherit and therefore was not identified in her testing.  Therefore, it is important to remain in touch with cancer genetics in the future so that we can continue to offer Ms. Merten the most up to date genetic testing.   ADDITIONAL GENETIC TESTING: We discussed with Ms. Paulson that her genetic testing was fairly extensive.  If there are genes identified to increase cancer risk that can be analyzed in the future, we would be happy to discuss and coordinate this testing at that time.    CANCER SCREENING RECOMMENDATIONS: Ms. Arens test result is considered negative (normal).  This means that we have not identified a hereditary cause for her  personal and family history of cancer at this time. Most cancers happen  by chance and this negative test suggests that her cancer may fall into this category.    While reassuring, this does not definitively rule out a hereditary predisposition to cancer. It is still possible that there could be genetic mutations that are undetectable by current technology. There could be genetic mutations in genes that have not been tested or identified to increase cancer risk.  Therefore, it is recommended she continue to follow the cancer management and screening guidelines provided by her oncology and primary healthcare provider.   An individual's cancer risk and medical management are not determined by genetic test results alone. Overall cancer risk assessment incorporates additional factors, including personal medical history, family history, and any available genetic information that may result in a personalized plan for cancer prevention and surveillance.  RECOMMENDATIONS FOR FAMILY MEMBERS:  Relatives in this family might be at some increased risk of developing cancer, over the general population risk, simply due to the family history of cancer.  We recommended female relatives in this family have a yearly mammogram beginning at age 28, or 46 years younger than the earliest onset of cancer, an annual clinical breast exam, and perform monthly breast self-exams. Female relatives  in this family should also have a gynecological exam as recommended by their primary provider.  All family members should be referred for colonoscopy starting at age 72.    It is also possible there is a hereditary cause for the cancer in Ms. Wetsel's family that she did not inherit and therefore was not identified in her.  Based on Ms. Fantini's family history, we recommended paternal relatives have genetic counseling and testing. Ms. Dicker will let us know if we can be of any assistance in coordinating genetic counseling and/or testing for these family members.  FOLLOW-UP: Lastly, we discussed with Ms. Jhaveri that  cancer genetics is a rapidly advancing field and it is possible that new genetic tests will be appropriate for her and/or her family members in the future. We encouraged her to remain in contact with cancer genetics on an annual basis so we can update her personal and family histories and let her know of advances in cancer genetics that may benefit this family.   Our contact number was provided. Ms. Macaulay questions were answered to her satisfaction, and she knows she is welcome to call us at anytime with additional questions or concerns.   Faith Rogue, MS, [facility] Genetic Counselor [email or web address] Phone: [phone]

## 2020-01-22 NOTE — Telephone Encounter (Signed)
Revealed negative genetic testing.  This normal result is reassuring and indicates that it is unlikely Ms. Corker's cancer is due to a hereditary cause.  It is unlikely that there is an increased risk of another cancer due to a mutation in one of these genes.  However, genetic testing is not perfect, and cannot definitively rule out a hereditary cause.  It will be important for her to keep in contact with genetics to learn if any additional testing may be needed in the future.      

## 2020-01-24 ENCOUNTER — Other Ambulatory Visit: Payer: Self-pay | Admitting: General Surgery

## 2020-01-24 DIAGNOSIS — Z170 Estrogen receptor positive status [ER+]: Secondary | ICD-10-CM

## 2020-01-24 DIAGNOSIS — C50412 Malignant neoplasm of upper-outer quadrant of left female breast: Secondary | ICD-10-CM

## 2020-01-24 NOTE — Progress Notes (Signed)
Subjective:     Patient ID: Tonya Bowen is a 74 y.o. female.  HPI  The following portions of the patient's history were reviewed and updated as appropriate.  This a new patient is here today for: office visit. Here for new diagnosis of left breast cancer. She states she could not feel anything different in the breast prior to the mammogram. She did bruise modestly from the biopsy.  Bilateral breast prostheses were placed in the late 1980s at [facility].  The patient has had no difficulty with her prostheses.  She reports that the time they replaced she was an "AA" cup woman.  Weight gain has taken her up to a extra-large sports bra.  The patient met with Earlie Server, MD from [facility] earlier today.   The patient has 2 children.  Her daughter has had ALS for 20 years and is now homebound with a feeding tube and ventilator.  Recent hospitalization with diverticulitis.  She is here with her granddaughter, Janett Billow.   Review of Systems  Constitutional: Negative for chills and fever.  Respiratory: Negative for cough.       Chief Complaint  Patient presents with  . Breast Cancer     BP 112/68   Pulse 80   Temp 36.1 C (97 F)   Ht 162.6 cm ('5\' 4"' )   Wt 94.8 kg (209 lb)   SpO2 97%   BMI 35.87 kg/m       Past Medical History:  Diagnosis Date  . Allergic rhinitis    seasonal  . Arthritis    hips  . Cataract cortical, senile   . Hearing loss   . Hemorrhoids   . History of gastric ulcer   . Hypertension   . Osteoporosis   . Stomach ulcer   . Tinnitus           Past Surgical History:  Procedure Laterality Date  . Breast implants Bilateral 1987  . bunionectomy Bilateral   . CATARACT EXTRACTION Left   . COLONOSCOPY  11/26/2011   Diverticulosis, int hemorrhoids  . COLONOSCOPY  11/26/2011   [facility]) - Diverticulosis, internal hemorrhoids, otherwise normal; repeat in 10 years  . EGD  09/12/2012   Gastric ulcer  .  HYSTERECTOMY  1987   Secondary to fibroids  . KYPHOPLASTY  02/06/2014  . Radial keratotomy  1986  . TONSILLECTOMY  1953  . UPPER GASTROINTESTINAL ENDOSCOPY  11/21/2012   [facility]) - Normal esophagus, gastritis, normal duodenum                OB History    Gravida  2   Para  2   Term      Preterm      AB      Living        SAB      IAB      Ectopic      Molar      Multiple      Live Births          Obstetric Comments  Age at first period 32 Age of first pregnancy 7        Social History          Socioeconomic History  . Marital status: Divorced    Spouse name: Not on file  . Number of children: Not on file  . Years of education: Not on file  . Highest education level: Not on file  Occupational History  . Not on file  Tobacco Use  . Smoking status: Never Smoker  . Smokeless tobacco: Never Used  Substance and Sexual Activity  . Alcohol use: Yes    Alcohol/week: 0.0 standard drinks    Comment: rarely  . Drug use: No  . Sexual activity: Never  Other Topics Concern  . Not on file  Social History Narrative   Exercise:  None   Diet:  Red meat twice a week, fast food once a week, fried food 3 x a week   Pelvic:08/2017   Social Determinants of Health   Financial Resource Strain: Not on file  Food Insecurity: Not on file  Transportation Needs: Not on file       No Known Allergies  Current Medications        Current Outpatient Medications  Medication Sig Dispense Refill  . amLODIPine (NORVASC) 5 MG tablet Take 1 tablet (5 mg total) by mouth once daily 90 tablet 1  . ergocalciferol, vitamin D2, 1,250 mcg (50,000 unit) capsule TAKE 1 CAPSULE BY MOUTH ONCE A WEEK 4 capsule 11  . lisinopriL-hydrochlorothiazide (ZESTORETIC) 20-12.5 mg tablet TAKE 1 TABLET BY MOUTH ONCE DAILY 90 tablet 0  . omeprazole (PRILOSEC) 40 MG DR capsule TAKE 1 CAPSULE BY MOUTH EVERY DAY 90 capsule 1  . semaglutide (OZEMPIC) 0.25 mg or 0.5  mg(2 mg/1.5 mL) pen injector Inject 0.1875 mLs (0.25 mg total) subcutaneously once a week for 30 days 0.75 mL 1  . UNABLE TO FIND Diabetic Supplies:  Glucometer, test strips, lancets. Check glucose BID (fasting and 2 hours after eating). One month supply. Refills PRN. 1 Units prn   No current facility-administered medications for this visit.           Family History  Problem Relation Age of Onset  . Uterine cancer Mother   . Osteoporosis (Thinning of bones) Mother   . Diabetes type II Sister   . Diabetes type II Maternal Grandmother   . Breast cancer Paternal Aunt         Objective:   Physical Exam Constitutional:      Appearance: Normal appearance.  Eyes:     General: No scleral icterus. Cardiovascular:     Rate and Rhythm: Normal rate and regular rhythm.  Pulmonary:     Effort: Pulmonary effort is normal.     Breath sounds: Normal breath sounds.  Chest:  Breasts:     Right: Normal. No axillary adenopathy or supraclavicular adenopathy.     Left: Mass present. No axillary adenopathy or supraclavicular adenopathy.     Musculoskeletal:        General: Normal range of motion.     Cervical back: Normal range of motion and neck supple.  Lymphadenopathy:     Upper Body:     Right upper body: No supraclavicular or axillary adenopathy.     Left upper body: No supraclavicular or axillary adenopathy.  Skin:    General: Skin is warm and dry.  Neurological:     General: No focal deficit present.     Mental Status: She is alert and oriented to person, place, and time.  Psychiatric:        Mood and Affect: Mood normal.        Behavior: Behavior normal.        Thought Content: Thought content normal.      Labs and Radiology:   Mammograms of November 29, 2018 through December 27, 2019 were independently reviewed.  Ultrasound of December 19, 2019 reviewed as well.  New  mass in the upper outer quadrant of the left breast.    Markedly contracted breast  prostheses are noted bilaterally.  Core biopsy of December 27, 2019:   A. LEFT BREAST, 12:30 4CMFN; ULTRASOUND-GUIDED BIOPSY:  - INVASIVE MAMMARY CARCINOMA WITH MUCINOUS FEATURES.   Size of invasive carcinoma: 8 mm in this sample  Histologic grade of invasive carcinoma: Grade 2            Glandular/tubular differentiation score: 3            Nuclear pleomorphism score: 2            Mitotic rate score: 1            Total score: 6  Ductal carcinoma in situ: Not identified  Lymphovascular invasion: Not identified    ADDENDUM:  CASE SUMMARY: BREAST BIOMARKER TESTS  TEST(S) PERFORMED:  Estrogen Receptor (ER) Status: POSITIVE      Percentage of cells with nuclear positivity: Greater than 90%      Average intensity of staining: Strong   Progesterone Receptor (PgR) Status: POSITIVE      Percentage of cells with nuclear positivity: Greater than 90%      Average intensity of staining: Moderate   HER2 (by immunohistochemistry): NEGATIVE (Score 0)   There is a significant discordance in the size based on mammography/ultrasound and the core biopsy recently completed.  Limited ultrasound of the breast was undertaken to determine if preoperative wire localization would be required if the patient shows breast conservation.  In the 1 o'clock position, 6 cm from the nipple of the left breast a 0.95 x 1.41 x 1.46 cm biopsy cavity with the clip is clearly evident.  This is inferior and medial to the prosthesis.  January 03, 2019 laboratory studies: Collected:  01/03/2020 15:05  0 Result Notes View Full Report  Ref Range & Units 15:05  Sodium 135 - 145 mmol/L 138   Potassium 3.5 - 5.1 mmol/L 4.0   Chloride 98 - 111 mmol/L 101   CO2 22 - 32 mmol/L 26   Glucose, Bld 70 - 99 mg/dL 116High   Comment: Glucose reference range applies only to samples taken after fasting for at least 8 hours.  BUN 8 - 23 mg/dL 32High   Creatinine,  Ser 0.44 - 1.00 mg/dL 1.58High   Calcium 8.9 - 10.3 mg/dL 9.2   Total Protein 6.5 - 8.1 g/dL 7.4   Albumin 3.5 - 5.0 g/dL 4.3   AST 15 - 41 U/L 26   ALT 0 - 44 U/L 22   Alkaline Phosphatase 38 - 126 U/L 71   Total Bilirubin 0.3 - 1.2 mg/dL 0.7   GFR, Estimated >60 mL/min 34Low        WBC 4.0 - 10.5 K/uL 11.0High   RBC 3.87 - 5.11 MIL/uL 4.55   Hemoglobin 12.0 - 15.0 g/dL 14.0   HCT 36 - 46 % 40.9   MCV 80.0 - 100.0 fL 89.9   MCH 26.0 - 34.0 pg 30.8   MCHC 30.0 - 36.0 g/dL 34.2   RDW 11.5 - 15.5 % 12.1   Platelets 150 - 400 K/uL 396   nRBC 0.0 - 0.2 % 0.0   Neutrophils Relative % % 74   Neutro Abs 1.7 - 7.7 K/uL 8.3High   Lymphocytes Relative % 16   Lymphs Abs 0.7 - 4.0 K/uL 1.8   Monocytes Relative % 6   Monocytes Absolute 0.1 - 1.0 K/uL 0.7   Eosinophils Relative % 2  Eosinophils Absolute 0.0 - 0.5 K/uL 0.2   Basophils Relative % 1   Basophils Absolute 0.0 - 0.1 K/uL 0.1   Immature Granulocytes % 1   Abs Immature Granulocytes 0.00 - 0.07 K/uL 0.05      Component Ref Range & Units 2 mo ago  (10/16/19) 6 mo ago  (06/14/19) 11 mo ago  (01/10/19) 1 yr ago  (05/24/18) 2 yr ago  (12/07/17) 2 yr ago  (08/26/17)   Hemoglobin A1C 4.2 - 5.6 % 7.0High  7.1High  7.2High  6.3High  6.0High  6.9High    Average Blood Glucose (Calc) mg/dL 154  157  160  134  126  151        Assessment:     Newly diagnosed breast cancer.  Clinical stage I based on biopsy size.  Old silicon-based implants, asymptomatic.    Plan:     Approximately an hour was spent reviewing the options for management.  This includes breast conservation with postoperative radiotherapy and sentinel node biopsy versus mastectomy with or without reconstruction.  At present, no indication for adjuvant chemotherapy unless there is indeed a significant discrepancy between the biopsy size and the final pathology.  The patient had been recommended to have genetic testing based on 2 paternal  aunts who had breast cancer.  The likelihood of testing did show a significant genetic abnormality is quite small.  Even if a BRCA gene was detected, she does not mandate mastectomy, unless she desires.  Contralateral mastectomy without a BRCA gene abnormality would be discouraged based on risk of infection, prolonged anesthesia and delayed healing.  Should she desire to have her old implants removed and new implants placed, arrangements would be made for plastic surgery evaluation.  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  In spite of this she has a modest social life.  An informational brochure was provided with appropriate websites.  Opportunity for second surgical opinion locally or at regional centers was reviewed.  Plastic surgery availability for consultation was discussed.  Pros and cons of unilateral prosthesis removal and final breast volume were discussed.  Unless the prosthesis would make radiation therapy difficult, my bias would be to leave them in place as she is asymptomatic.    The patient will review her options and notify the office of how we can be of assistance.  Entered by Karie Fetch, RN, acting as a scribe for Dr. Hervey Ard, MD.  The documentation recorded by the scribe accurately reflects the service I personally performed and the decisions made by me.   Robert Bellow, MD FACS   The patient has competed genetic testing and is negative.  She does not desire removal of her old prosthesis or plastic surgery assessment.   Will arrange for wide excision and SLN biopsy. Depending on the location of the old implant, will discuss with her possible removal if is involved with the cancer.

## 2020-01-28 ENCOUNTER — Encounter
Admission: RE | Admit: 2020-01-28 | Discharge: 2020-01-28 | Disposition: A | Discharge status: Home / Self Care | Payer: Medicare Other | Source: Ambulatory Visit | Attending: General Surgery | Admitting: General Surgery

## 2020-01-28 ENCOUNTER — Other Ambulatory Visit: Payer: Self-pay

## 2020-01-28 DIAGNOSIS — Z01818 Encounter for other preprocedural examination: Secondary | ICD-10-CM | POA: Insufficient documentation

## 2020-01-28 DIAGNOSIS — I10 Essential (primary) hypertension: Secondary | ICD-10-CM | POA: Diagnosis not present

## 2020-01-28 DIAGNOSIS — E119 Type 2 diabetes mellitus without complications: Secondary | ICD-10-CM | POA: Insufficient documentation

## 2020-01-28 HISTORY — DX: Unspecified osteoarthritis, unspecified site: M19.90

## 2020-01-28 HISTORY — DX: Type 2 diabetes mellitus without complications: E11.9

## 2020-01-28 HISTORY — DX: Other complications of anesthesia, initial encounter: T88.59XA

## 2020-01-28 HISTORY — DX: Gastro-esophageal reflux disease without esophagitis: K21.9

## 2020-01-28 NOTE — Patient Instructions (Addendum)
Your procedure is scheduled on:02-04-20 MONDAY Report to the Registration Desk on the 1st floor of the [facility] (2ND DESK ON RIGHT) IN THE MEDICAL MALL  REMEMBER: Instructions that are not followed completely may result in serious medical risk, up to and including death; or upon the discretion of your surgeon and anesthesiologist your surgery may need to be rescheduled.  Do not eat food after midnight the night before surgery.  No gum chewing, lozengers or hard candies.  You may however, drink WATER up to 2 hours before you are scheduled to arrive for your surgery. Do not drink anything within 2 hours of your scheduled arrival time.   Type 1 and Type 2 diabetics should only drink water.  TAKE THESE MEDICATIONS THE MORNING OF SURGERY WITH A SIP OF WATER: -NORVASC (AMLODIPINE) -ZYRTEC (CETIRZINE) -GABAPENTIN (NEURONTIN) -PRILOSEC (OMEPRAZOLE)-take one the night before and one on the morning of surgery - helps to prevent nausea after surgery.)   One week prior to surgery: Stop Anti-inflammatories (NSAIDS) such as Advil, Aleve, Ibuprofen, Motrin, Naproxen, Naprosyn and Aspirin based products such as Excedrin, Goodys Powder, BC Powder-OK TO TAKE TYLENOL IF NEEDED  Stop ANY OVER THE COUNTER supplements until after surgery. (However, you may continue taking Vitamin D and Calcium up until the day before surgery.)  No Alcohol for 24 hours before or after surgery.  No Smoking including e-cigarettes for 24 hours prior to surgery.  No chewable tobacco products for at least 6 hours prior to surgery.  No nicotine patches on the day of surgery.  Do not use any "recreational" drugs for at least a week prior to your surgery.  Please be advised that the combination of cocaine and anesthesia may have negative outcomes, up to and including death. If you test positive for cocaine, your surgery will be cancelled.  On the morning of surgery brush your teeth with  toothpaste and water, you may rinse your mouth with mouthwash if you wish. Do not swallow any toothpaste or mouthwash.  Do not wear jewelry, make-up, hairpins, clips or nail polish.  Do not wear lotions, powders, or perfumes.   Do not shave body from the neck down 48 hours prior to surgery just in case you cut yourself which could leave a site for infection.  Also, freshly shaved skin may become irritated if using the CHG soap.  Contact lenses, hearing aids and dentures may not be worn into surgery.  Do not bring valuables to the hospital. [facility] is not responsible for any missing/lost belongings or valuables.   Use CHG Soap as directed on instruction sheet.   Notify your doctor if there is any change in your medical condition (cold, fever, infection).  Wear comfortable clothing (specific to your surgery type) to the hospital.  Plan for stool softeners for home use; pain medications have a tendency to cause constipation. You can also help prevent constipation by eating foods high in fiber such as fruits and vegetables and drinking plenty of fluids as your diet allows.  After surgery, you can help prevent lung complications by doing breathing exercises.  Take deep breaths and cough every 1-2 hours. Your doctor may order a device called an Incentive Spirometer to help you take deep breaths. When coughing or sneezing, hold a pillow firmly against your incision with both hands. This is called splinting. Doing this helps protect your incision. It also decreases belly discomfort.  If you are being admitted to the hospital overnight, leave your  suitcase in the car. After surgery it may be brought to your room.  If you are being discharged the day of surgery, you will not be allowed to drive home. You will need a responsible adult (18 years or older) to drive you home and stay with you that night.   If you are taking public transportation, you will need to have a responsible adult (18  years or older) with you. Please confirm with your physician that it is acceptable to use public transportation.   Please call the [facility] Dept. at [phone] if you have any questions about these instructions.  Visitation Policy:  Patients undergoing a surgery or procedure may have one family member or support person with them as long as that person is not COVID-19 positive or experiencing its symptoms.  That person may remain in the waiting area during the procedure.  Inpatient Visitation Update:   In an effort to ensure the safety of our team members and our patients, we are implementing a change to our visitation policy:  Effective Monday, Aug. 9, at 7 a.m., inpatients will be allowed one support person.  o The support person may change daily.  o The support person must pass our screening, gel in and out, and wear a mask at all times, including in the patients room.  o Patients must also wear a mask when staff or their support person are in the room.  o Masking is required regardless of vaccination status.  Systemwide, no visitors 17 or younger.

## 2020-01-29 ENCOUNTER — Encounter
Admission: RE | Admit: 2020-01-29 | Discharge: 2020-01-29 | Disposition: A | Discharge status: Home / Self Care | Payer: Medicare Other | Source: Ambulatory Visit | Attending: General Surgery | Admitting: General Surgery

## 2020-01-29 DIAGNOSIS — Z01818 Encounter for other preprocedural examination: Secondary | ICD-10-CM | POA: Diagnosis not present

## 2020-02-01 ENCOUNTER — Other Ambulatory Visit
Admission: RE | Admit: 2020-02-01 | Discharge: 2020-02-01 | Disposition: A | Discharge status: Home / Self Care | Payer: Medicare Other | Source: Ambulatory Visit | Attending: General Surgery | Admitting: General Surgery

## 2020-02-01 ENCOUNTER — Other Ambulatory Visit: Payer: Self-pay | Admitting: General Surgery

## 2020-02-01 ENCOUNTER — Other Ambulatory Visit: Payer: Self-pay

## 2020-02-01 DIAGNOSIS — Z20822 Contact with and (suspected) exposure to covid-19: Secondary | ICD-10-CM | POA: Insufficient documentation

## 2020-02-01 DIAGNOSIS — Z01812 Encounter for preprocedural laboratory examination: Secondary | ICD-10-CM | POA: Diagnosis present

## 2020-02-01 DIAGNOSIS — C50412 Malignant neoplasm of upper-outer quadrant of left female breast: Secondary | ICD-10-CM

## 2020-02-02 LAB — SARS CORONAVIRUS 2 (TAT 6-24 HRS): SARS Coronavirus 2: NEGATIVE

## 2020-02-03 MED ORDER — SODIUM CHLORIDE 0.9 % IV SOLN: INTRAVENOUS | Status: DC

## 2020-02-03 MED ORDER — CHLORHEXIDINE GLUCONATE 0.12 % MT SOLN: 15.0000 mL | Freq: Once | OROMUCOSAL | Status: DC

## 2020-02-03 MED ORDER — ORAL CARE MOUTH RINSE: 15.0000 mL | Freq: Once | OROMUCOSAL | Status: DC

## 2020-02-03 MED ORDER — CEFAZOLIN SODIUM-DEXTROSE 2-4 GM/100ML-% IV SOLN
2.0000 g | INTRAVENOUS | Status: AC
  Administered 2020-02-04: 2 g via INTRAVENOUS

## 2020-02-04 ENCOUNTER — Ambulatory Visit
Admission: RE | Admit: 2020-02-04 | Discharge: 2020-02-04 | Disposition: A | Discharge status: Home / Self Care | Payer: Medicare Other | Source: Ambulatory Visit | Attending: General Surgery | Admitting: General Surgery

## 2020-02-04 ENCOUNTER — Encounter
Admission: RE | Disposition: A | Discharge status: Home / Self Care | Payer: Self-pay | Source: Home / Self Care | Attending: General Surgery

## 2020-02-04 ENCOUNTER — Ambulatory Visit
Admission: RE | Admit: 2020-02-04 | Discharge: 2020-02-04 | Disposition: A | Discharge status: Home / Self Care | Payer: Medicare Other | Attending: General Surgery | Admitting: General Surgery

## 2020-02-04 ENCOUNTER — Ambulatory Visit: Payer: Medicare Other | Admitting: Certified Registered"

## 2020-02-04 ENCOUNTER — Encounter: Payer: Self-pay | Admitting: General Surgery

## 2020-02-04 ENCOUNTER — Other Ambulatory Visit: Payer: Self-pay

## 2020-02-04 DIAGNOSIS — C50412 Malignant neoplasm of upper-outer quadrant of left female breast: Secondary | ICD-10-CM | POA: Diagnosis not present

## 2020-02-04 DIAGNOSIS — Z79899 Other long term (current) drug therapy: Secondary | ICD-10-CM | POA: Diagnosis not present

## 2020-02-04 DIAGNOSIS — Z7984 Long term (current) use of oral hypoglycemic drugs: Secondary | ICD-10-CM | POA: Diagnosis not present

## 2020-02-04 DIAGNOSIS — Z803 Family history of malignant neoplasm of breast: Secondary | ICD-10-CM | POA: Diagnosis not present

## 2020-02-04 DIAGNOSIS — Z9882 Breast implant status: Secondary | ICD-10-CM | POA: Diagnosis not present

## 2020-02-04 DIAGNOSIS — Z170 Estrogen receptor positive status [ER+]: Secondary | ICD-10-CM

## 2020-02-04 HISTORY — PX: BREAST LUMPECTOMY WITH SENTINEL LYMPH NODE BIOPSY: SHX5597

## 2020-02-04 LAB — GLUCOSE, CAPILLARY
Glucose-Capillary: 119 mg/dL — ABNORMAL HIGH (ref 70–99)
Glucose-Capillary: 123 mg/dL — ABNORMAL HIGH (ref 70–99)

## 2020-02-04 SURGERY — BREAST LUMPECTOMY WITH SENTINEL LYMPH NODE BX
Anesthesia: General | Site: Breast | Laterality: Left

## 2020-02-04 MED ORDER — EPHEDRINE 5 MG/ML INJ
INTRAVENOUS | Status: AC
  Filled 2020-02-04: qty 10

## 2020-02-04 MED ORDER — ONDANSETRON HCL 4 MG/2ML IJ SOLN
INTRAMUSCULAR | Status: AC
  Filled 2020-02-04: qty 2

## 2020-02-04 MED ORDER — GLYCOPYRROLATE 0.2 MG/ML IJ SOLN
INTRAMUSCULAR | Status: AC
  Filled 2020-02-04: qty 1

## 2020-02-04 MED ORDER — FENTANYL CITRATE (PF) 100 MCG/2ML IJ SOLN
INTRAMUSCULAR | Status: AC
  Filled 2020-02-04: qty 2

## 2020-02-04 MED ORDER — DEXAMETHASONE SODIUM PHOSPHATE 10 MG/ML IJ SOLN
INTRAMUSCULAR | Status: DC | PRN
  Administered 2020-02-04: 10 mg via INTRAVENOUS

## 2020-02-04 MED ORDER — HYDROCODONE-ACETAMINOPHEN 5-325 MG PO TABS
ORAL_TABLET | ORAL | Status: AC
  Filled 2020-02-04: qty 1

## 2020-02-04 MED ORDER — LIDOCAINE HCL (CARDIAC) PF 100 MG/5ML IV SOSY
PREFILLED_SYRINGE | INTRAVENOUS | Status: DC | PRN
  Administered 2020-02-04: 50 mg via INTRAVENOUS

## 2020-02-04 MED ORDER — ACETAMINOPHEN 10 MG/ML IV SOLN
INTRAVENOUS | Status: DC | PRN
  Administered 2020-02-04: 1000 mg via INTRAVENOUS

## 2020-02-04 MED ORDER — METHYLENE BLUE 0.5 % INJ SOLN
INTRAVENOUS | Status: AC
  Filled 2020-02-04: qty 10

## 2020-02-04 MED ORDER — PROPOFOL 10 MG/ML IV BOLUS
INTRAVENOUS | Status: AC
  Filled 2020-02-04: qty 20

## 2020-02-04 MED ORDER — FENTANYL CITRATE (PF) 100 MCG/2ML IJ SOLN
INTRAMUSCULAR | Status: DC | PRN
  Administered 2020-02-04 (×4): 50 ug via INTRAVENOUS

## 2020-02-04 MED ORDER — FENTANYL CITRATE (PF) 100 MCG/2ML IJ SOLN: 25.0000 ug | INTRAMUSCULAR | Status: DC | PRN

## 2020-02-04 MED ORDER — LIDOCAINE HCL (PF) 2 % IJ SOLN
INTRAMUSCULAR | Status: AC
  Filled 2020-02-04: qty 5

## 2020-02-04 MED ORDER — TECHNETIUM TC 99M TILMANOCEPT KIT
1.0000 | PACK | Freq: Once | INTRAVENOUS | Status: AC | PRN
  Administered 2020-02-04: 1.02 via INTRADERMAL

## 2020-02-04 MED ORDER — GLYCOPYRROLATE 0.2 MG/ML IJ SOLN
INTRAMUSCULAR | Status: DC | PRN
  Administered 2020-02-04: .2 mg via INTRAVENOUS

## 2020-02-04 MED ORDER — PROPOFOL 10 MG/ML IV BOLUS
INTRAVENOUS | Status: DC | PRN
  Administered 2020-02-04: 150 mg via INTRAVENOUS

## 2020-02-04 MED ORDER — CEFAZOLIN SODIUM-DEXTROSE 2-4 GM/100ML-% IV SOLN
INTRAVENOUS | Status: AC
  Filled 2020-02-04: qty 100

## 2020-02-04 MED ORDER — PROMETHAZINE HCL 25 MG/ML IJ SOLN: 6.2500 mg | INTRAMUSCULAR | Status: DC | PRN

## 2020-02-04 MED ORDER — DEXAMETHASONE SODIUM PHOSPHATE 10 MG/ML IJ SOLN
INTRAMUSCULAR | Status: AC
  Filled 2020-02-04: qty 1

## 2020-02-04 MED ORDER — HYDROCODONE-ACETAMINOPHEN 5-325 MG PO TABS: 1.0000 | ORAL_TABLET | ORAL | 0 refills | Status: DC | PRN

## 2020-02-04 MED ORDER — MEPERIDINE HCL 50 MG/ML IJ SOLN: 6.2500 mg | INTRAMUSCULAR | Status: DC | PRN

## 2020-02-04 MED ORDER — OXYCODONE HCL 5 MG PO TABS
ORAL_TABLET | ORAL | Status: AC
  Administered 2020-02-04: 5 mg via ORAL
  Filled 2020-02-04: qty 1

## 2020-02-04 MED ORDER — OXYCODONE HCL 5 MG PO TABS: 5.0000 mg | ORAL_TABLET | Freq: Once | ORAL | Status: AC | PRN

## 2020-02-04 MED ORDER — OXYCODONE HCL 5 MG/5ML PO SOLN: 5.0000 mg | Freq: Once | ORAL | Status: AC | PRN

## 2020-02-04 MED ORDER — ONDANSETRON HCL 4 MG/2ML IJ SOLN
INTRAMUSCULAR | Status: DC | PRN
  Administered 2020-02-04: 4 mg via INTRAVENOUS

## 2020-02-04 MED ORDER — EPHEDRINE SULFATE 50 MG/ML IJ SOLN
INTRAMUSCULAR | Status: DC | PRN
  Administered 2020-02-04: 20 mg via INTRAVENOUS
  Administered 2020-02-04 (×3): 10 mg via INTRAVENOUS

## 2020-02-04 MED ORDER — ACETAMINOPHEN 10 MG/ML IV SOLN
INTRAVENOUS | Status: AC
  Filled 2020-02-04: qty 100

## 2020-02-04 MED ORDER — METHYLENE BLUE 0.5 % INJ SOLN
INTRAVENOUS | Status: DC | PRN
  Administered 2020-02-04: 5 mL via INTRADERMAL

## 2020-02-04 MED ORDER — BUPIVACAINE-EPINEPHRINE (PF) 0.5% -1:200000 IJ SOLN
INTRAMUSCULAR | Status: DC | PRN
  Administered 2020-02-04: 20 mL

## 2020-02-04 MED ORDER — CHLORHEXIDINE GLUCONATE 0.12 % MT SOLN
OROMUCOSAL | Status: AC
  Filled 2020-02-04: qty 15

## 2020-02-04 SURGICAL SUPPLY — 58 items
APL PRP STRL LF DISP 70% ISPRP (MISCELLANEOUS) ×1
BINDER BREAST LRG (GAUZE/BANDAGES/DRESSINGS) IMPLANT
BINDER BREAST MEDIUM (GAUZE/BANDAGES/DRESSINGS) IMPLANT
BINDER BREAST XLRG (GAUZE/BANDAGES/DRESSINGS) IMPLANT
BINDER BREAST XXLRG (GAUZE/BANDAGES/DRESSINGS) IMPLANT
BLADE BOVIE TIP EXT 4 (BLADE) IMPLANT
BLADE SURG 15 STRL SS SAFETY (BLADE) ×6 IMPLANT
BULB RESERV EVAC DRAIN JP 100C (MISCELLANEOUS) IMPLANT
CANISTER SUCT 1200ML W/VALVE (MISCELLANEOUS) ×3 IMPLANT
CHLORAPREP W/TINT 26 (MISCELLANEOUS) ×3 IMPLANT
CLOSURE WOUND 1/2 X4 (GAUZE/BANDAGES/DRESSINGS) ×1
CNTNR SPEC 2.5X3XGRAD LEK (MISCELLANEOUS)
CONT SPEC 4OZ STER OR WHT (MISCELLANEOUS)
CONT SPEC 4OZ STRL OR WHT (MISCELLANEOUS)
CONTAINER SPEC 2.5X3XGRAD LEK (MISCELLANEOUS) IMPLANT
COVER PROBE FLX POLY STRL (MISCELLANEOUS) ×3 IMPLANT
COVER WAND RF STERILE (DRAPES) ×3 IMPLANT
DEVICE DUBIN SPECIMEN MAMMOGRA (MISCELLANEOUS) ×3 IMPLANT
DRAIN CHANNEL JP 15F RND 16 (MISCELLANEOUS) IMPLANT
DRAPE LAPAROTOMY TRNSV 106X77 (MISCELLANEOUS) ×3 IMPLANT
DRSG GAUZE FLUFF 36X18 (GAUZE/BANDAGES/DRESSINGS) ×6 IMPLANT
DRSG TELFA 3X8 NADH (GAUZE/BANDAGES/DRESSINGS) ×3 IMPLANT
ELECT CAUTERY BLADE TIP 2.5 (TIP) ×3
ELECT REM PT RETURN 9FT ADLT (ELECTROSURGICAL) ×3
ELECTRODE CAUTERY BLDE TIP 2.5 (TIP) ×1 IMPLANT
ELECTRODE REM PT RTRN 9FT ADLT (ELECTROSURGICAL) ×1 IMPLANT
GLOVE BIO SURGEON STRL SZ7.5 (GLOVE) ×3 IMPLANT
GLOVE INDICATOR 8.0 STRL GRN (GLOVE) ×3 IMPLANT
GOWN STRL REUS W/ TWL LRG LVL3 (GOWN DISPOSABLE) ×2 IMPLANT
GOWN STRL REUS W/TWL LRG LVL3 (GOWN DISPOSABLE) ×6
KIT TURNOVER KIT A (KITS) ×3 IMPLANT
LABEL OR SOLS (LABEL) ×3 IMPLANT
MANIFOLD NEPTUNE II (INSTRUMENTS) ×3 IMPLANT
MARGIN MAP 10MM (MISCELLANEOUS) ×3 IMPLANT
NEEDLE HYPO 22GX1.5 SAFETY (NEEDLE) ×3 IMPLANT
NEEDLE HYPO 25X1 1.5 SAFETY (NEEDLE) ×6 IMPLANT
PACK BASIN MINOR (MISCELLANEOUS) ×3 IMPLANT
RETRACTOR RING XSMALL (MISCELLANEOUS) IMPLANT
RTRCTR WOUND ALEXIS 13CM XS SH (MISCELLANEOUS)
SHEARS FOC LG CVD HARMONIC 17C (MISCELLANEOUS) IMPLANT
SHEARS HARMONIC 9CM CVD (BLADE) IMPLANT
SLEVE PROBE SENORX GAMMA FIND (MISCELLANEOUS) IMPLANT
STRIP CLOSURE SKIN 1/2X4 (GAUZE/BANDAGES/DRESSINGS) ×2 IMPLANT
SUT ETHILON 3-0 FS-10 30 BLK (SUTURE) ×3
SUT SILK 2 0 (SUTURE) ×3
SUT SILK 2-0 18XBRD TIE 12 (SUTURE) ×1 IMPLANT
SUT VIC AB 2-0 CT1 27 (SUTURE) ×6
SUT VIC AB 2-0 CT1 TAPERPNT 27 (SUTURE) ×2 IMPLANT
SUT VIC AB 3-0 SH 27 (SUTURE) ×6
SUT VIC AB 3-0 SH 27X BRD (SUTURE) ×2 IMPLANT
SUT VIC AB 4-0 FS2 27 (SUTURE) ×6 IMPLANT
SUT VICRYL+ 3-0 144IN (SUTURE) ×3 IMPLANT
SUTURE EHLN 3-0 FS-10 30 BLK (SUTURE) ×1 IMPLANT
SWABSTK COMLB BENZOIN TINCTURE (MISCELLANEOUS) ×3 IMPLANT
SYR 10ML LL (SYRINGE) ×3 IMPLANT
SYR BULB IRRIG 60ML STRL (SYRINGE) ×3 IMPLANT
TAPE TRANSPORE STRL 2 31045 (GAUZE/BANDAGES/DRESSINGS) ×3 IMPLANT
WATER STERILE IRR 1000ML POUR (IV SOLUTION) ×3 IMPLANT

## 2020-02-04 NOTE — Transfer of Care (Signed)
Immediate Anesthesia Transfer of Care Note  Patient: Tonya Bowen  Procedure(s) Performed: BREAST LUMPECTOMY WITH SENTINEL LYMPH NODE BX (Left Breast)  Patient Location: PACU  Anesthesia Type:General  Level of Consciousness: sedated  Airway & Oxygen Therapy: Patient Spontanous Breathing and Patient connected to face mask oxygen  Post-op Assessment: Report given to RN and Post -op Vital signs reviewed and stable  Post vital signs: Reviewed  Last Vitals:  Vitals Value Taken Time  BP 94/57 02/04/20 1402  Temp    Pulse 65 02/04/20 1402  Resp    SpO2 96 % 02/04/20 1402  Vitals shown include unvalidated device data.  Last Pain:  Vitals:   02/04/20 1152  TempSrc: Oral  PainSc: 0-No pain         Complications: No complications documented.

## 2020-02-04 NOTE — Anesthesia Preprocedure Evaluation (Addendum)
Anesthesia Evaluation  Patient identified by MRN, date of birth, ID band Patient awake    Reviewed: Allergy & Precautions, NPO status , Patient's Chart, lab work & pertinent test results  History of Anesthesia Complications (+) AWARENESS UNDER ANESTHESIA and history of anesthetic complications (awake during breast augmentation surgery)  Airway Mallampati: II  TM Distance: >3 FB Neck ROM: Full    Dental no notable dental hx.    Pulmonary neg pulmonary ROS, neg sleep apnea, neg COPD,    breath sounds clear to auscultation- rhonchi (-) wheezing      Cardiovascular hypertension, Pt. on medications (-) CAD, (-) Past MI, (-) Cardiac Stents and (-) CABG  Rhythm:Regular Rate:Normal - Systolic murmurs and - Diastolic murmurs    Neuro/Psych neg Seizures negative neurological ROS  negative psych ROS   GI/Hepatic Neg liver ROS, PUD, GERD  ,  Endo/Other  diabetes  Renal/GU negative Renal ROS     Musculoskeletal  (+) Arthritis ,   Abdominal (+) + obese,   Peds  Hematology negative hematology ROS (+)   Anesthesia Other Findings Past Medical History: No date: Arthritis 12/27/2019: Cancer ([place])     Comment:  left breast No date: Chronic back pain No date: Complication of anesthesia     Comment:  pt states she felt every thing during breast               augmentation surgery No date: Diabetes mellitus without complication (HCC) No date: Family history of breast cancer No date: Family history of lung cancer No date: Family history of uterine cancer No date: GERD (gastroesophageal reflux disease) No date: Hypertension No date: Prediabetes No date: Stomach ulcer   Reproductive/Obstetrics                             Anesthesia Physical Anesthesia Plan  ASA: III  Anesthesia Plan: General   Post-op Pain Management:    Induction: Intravenous  PONV Risk Score and Plan: 2 and Ondansetron,  Dexamethasone and Midazolam  Airway Management Planned: LMA  Additional Equipment:   Intra-op Plan:   Post-operative Plan:   Informed Consent: I have reviewed the patients History and Physical, chart, labs and discussed the procedure including the risks, benefits and alternatives for the proposed anesthesia with the patient or authorized representative who has indicated his/her understanding and acceptance.     Dental advisory given  Plan Discussed with: CRNA and Anesthesiologist  Anesthesia Plan Comments:        Anesthesia Quick Evaluation

## 2020-02-04 NOTE — Anesthesia Procedure Notes (Signed)
Procedure Name: LMA Insertion Performed by: Arafat Cocuzza, CRNA Pre-anesthesia Checklist: Patient identified, Patient being monitored, Timeout performed, Emergency Drugs available and Suction available Patient Re-evaluated:Patient Re-evaluated prior to induction Oxygen Delivery Method: Circle system utilized Preoxygenation: Pre-oxygenation with 100% oxygen Induction Type: IV induction Ventilation: Mask ventilation without difficulty LMA: LMA inserted LMA Size: 4.0 Tube type: Oral Number of attempts: 1 Placement Confirmation: positive ETCO2 and breath sounds checked- equal and bilateral Tube secured with: Tape Dental Injury: Teeth and Oropharynx as per pre-operative assessment        

## 2020-02-04 NOTE — Op Note (Signed)
Preoperative diagnosis: Invasive mammary carcinoma of the left breast.  Desire for breast conservation.  Postoperative diagnosis: Same.  Operative procedure: Left breast wide excision with ultrasound guidance, tissue transfer, sentinel node biopsy.  Operating surgeon: Hervey Ard, MD.  Anesthesia: General by LMA, Marcaine 0.5% with 1: 200,000 units of epinephrine, 30 cc.  Estimated blood loss: Less than 10 cc.  Clinical note: This 74 year old woman recently had an abnormal mammogram and biopsy showed evidence of invasive mammary carcinoma.  She desired breast conservation.  The patient previously had had breast augmentation but did not want removal or replacement of the prostheses unless mandated by the surgical excision.  She received Ancef prior to the procedure.  She had been injected with technetium sulfur colloid prior to presentation to the operating theater.  Operative note: With the patient under adequate general anesthesia the area of the nipple was prepped with alcohol and 5 cc of 0.5% methylene blue was injected.  The skin was then prepped with ChloraPrep and draped.  The area of the previous biopsy at the 1 o'clock position 6 cm from nipple was identified with ultrasound.  The margins were identified and these what appeared to be the biopsy cavity extended to about 10 mm from the skin.  A curvilinear incision from the 11 to 2 o'clock position was made and flaps were elevated fairly thin about 6 mm in thickness and a block of tissue 3 x 4 x 3 cm extending down to but not including the pectoralis fascia was excised orientated and specimen radiograph confirmed the previously placed clip in the center of the specimen.  While the breast specimen was being processed attention was turned to the axilla.  The gamma finder was utilized in the area of increased uptake was identified.  Local anesthesia was infiltrated.  A small transverse incision was made in the skin incised sharply and  remaining dissection completed with electrocautery.  Nodes adjacent to each other 1 hot and blue and the other hot were identified and then deep to this a third node was appreciated.  These were all sent together in formalin for routine histology.  The wound was closed in layers with interrupted 2-0 Vicryl to the axillary envelope and then to the adipose layer as above.  The skin was closed with a running 4-0 Vicryl subcuticular suture.  Pathologist read ported that the anterior (skin) and superior borders of resection of bladder the tumor mass.  The original incision was closed with a running 3-0 nylon in an ellipse of skin resected to include the previous soft tissue above the biopsy site and a new 1 cm superior margin.  The specimen was sutured to a Telfa pad with the new superior margin on the pad and placed in formalin for routine histology.  It was necessary now to mobilize the breast parenchyma circumferentially to allow approximation with layers of 2-0 Vicryl figure-of-eight suture.  The capsule for the previously placed implant was violated in 2 areas and these were closed with interrupted 2-0 Vicryl figure-of-eight sutures without contamination.  The skin layer was approximated with a final layer of 2-0 Vicryl sutures and then this been closed with a running 4-0 Vicryl subcuticular sutures.  There was slight tethering of the upper outer aspect of the areola but this was fairly minimal.  Benzoin and Steri-Strips followed by Telfa, fluff gauze and a compressive wrap were applied.  Patient tolerated the procedure well and was taken recovery in stable condition.

## 2020-02-04 NOTE — Discharge Instructions (Signed)

## 2020-02-04 NOTE — Anesthesia Postprocedure Evaluation (Signed)
Anesthesia Post Note  Patient: Tonya Bowen  Procedure(s) Performed: BREAST LUMPECTOMY WITH SENTINEL LYMPH NODE BX (Left Breast)  Patient location during evaluation: PACU Anesthesia Type: General Level of consciousness: awake and alert and oriented Pain management: pain level controlled Vital Signs Assessment: post-procedure vital signs reviewed and stable Respiratory status: spontaneous breathing, nonlabored ventilation and respiratory function stable Cardiovascular status: blood pressure returned to baseline and stable Postop Assessment: no signs of nausea or vomiting Anesthetic complications: no   No complications documented.   Last Vitals:  Vitals:   02/04/20 1441 02/04/20 1447  BP: 124/63 (!) 113/53  Pulse: 76 71  Resp: 19 18  Temp: 36.5 C   SpO2: 96% 97%    Last Pain:  Vitals:   02/04/20 1441  TempSrc:   PainSc: 0-No pain                 Daiquan Resnik

## 2020-02-04 NOTE — H&P (Signed)
Tonya Bowen [id number] Nov 23, 1945     HPI:  Patient with recently identified left breast cancer. Desires breast conservation.  Tolerated SLN injection well.   Medications Prior to Admission  Medication Sig Dispense Refill Last Dose  . acetaminophen (TYLENOL) 325 MG tablet Take 650 mg by mouth every 6 (six) hours as needed for moderate pain.      Marland Kitchen amLODipine (NORVASC) 5 MG tablet Take 5 mg by mouth every morning.   1   . brimonidine-timolol (COMBIGAN) 0.2-0.5 % ophthalmic solution Place 1 drop into the right eye in the morning and at bedtime.     . calcium-vitamin D (OSCAL WITH D) 250-125 MG-UNIT tablet Take 1 tablet by mouth daily.     . cetirizine (ZYRTEC) 10 MG tablet Take 10 mg by mouth every morning.      . gabapentin (NEURONTIN) 100 MG capsule Take 1 capsule (100 mg total) by mouth daily for 15 days, THEN 2 capsules (200 mg total) daily for 15 days, THEN 3 capsules (300 mg total) daily. (Patient taking differently: Take 1 capsule (100 mg total) by mouth daily for 15 days, THEN 2 capsules (200 mg total) daily for 15 days, THEN 3 capsules (300 mg total) daily.AM) 135 capsule 0   . lisinopril-hydrochlorothiazide (ZESTORETIC) 20-12.5 MG tablet Take 1 tablet by mouth every morning.      Marland Kitchen omeprazole (PRILOSEC) 40 MG capsule Take 1 tablet by mouth every morning.      . Semaglutide (OZEMPIC, 0.25 OR 0.5 MG/DOSE, [place]) Inject 0.25 mg into the skin once a week.      . Vitamin D, Ergocalciferol, (DRISDOL) 1.25 MG (50000 UT) CAPS capsule Take 50,000 Units by mouth every 7 (seven) days.      . meloxicam (MOBIC) 15 MG tablet TAKE 1 TABLET BY MOUTH EVERY DAY (Patient not taking: Reported on 01/02/2020) 30 tablet 0   . prednisoLONE acetate (PRED FORTE) 1 % ophthalmic suspension Place 1 drop into the left eye See admin instructions. Tapering dose 3 times a day for 1 week, then 2 times a day for 1 week, then daily for 1 week (Patient not taking: Reported on 01/25/2020)  0 Not Taking at Unknown time   No  Known Allergies Past Medical History:  Diagnosis Date  . Arthritis   . Cancer ([place]) 12/27/2019   left breast  . Chronic back pain   . Complication of anesthesia    pt states she felt every thing during breast augmentation surgery  . Diabetes mellitus without complication ([place])   . Family history of breast cancer   . Family history of lung cancer   . Family history of uterine cancer   . GERD (gastroesophageal reflux disease)   . Hypertension   . Prediabetes   . Stomach ulcer    Past Surgical History:  Procedure Laterality Date  . ABDOMINAL HYSTERECTOMY    . AUGMENTATION MAMMAPLASTY Bilateral 1987  . BREAST ENHANCEMENT SURGERY    . BUNIONECTOMY Bilateral    Great toes  . CATARACT EXTRACTION Left   . colonoscopy    . KYPHOPLASTY N/A 02/06/2014   Procedure:  L2 KYPHOPLASTY;  Surgeon: Melina Schools, MD;  Location: [facility];  Service: Orthopedics;  Laterality: N/A;  . RADIAL KERATOTOMY    . TONSILLECTOMY    . UPPER GI ENDOSCOPY     Social History   Socioeconomic History  . Marital status: Divorced    Spouse name: Not on file  . Number of children: Not on file  .  Years of education: Not on file  . Highest education level: Not on file  Occupational History  . Not on file  Tobacco Use  . Smoking status: Never Smoker  . Smokeless tobacco: Never Used  Vaping Use  . Vaping Use: Never used  Substance and Sexual Activity  . Alcohol use: Yes    Alcohol/week: 2.0 standard drinks    Types: 2 Glasses of wine per week    Comment: socially  . Drug use: No  . Sexual activity: Not Currently  Other Topics Concern  . Not on file  Social History Narrative  . Not on file   Social Determinants of Health   Financial Resource Strain:   . Difficulty of Paying Living Expenses: Not on file  Food Insecurity:   . Worried About Charity fundraiser in the Last Year: Not on file  . Ran Out of Food in the Last Year: Not on file  Transportation Needs:   . Lack of Transportation (Medical):  Not on file  . Lack of Transportation (Non-Medical): Not on file  Physical Activity:   . Days of Exercise per Week: Not on file  . Minutes of Exercise per Session: Not on file  Stress:   . Feeling of Stress : Not on file  Social Connections:   . Frequency of Communication with Friends and Family: Not on file  . Frequency of Social Gatherings with Friends and Family: Not on file  . Attends Religious Services: Not on file  . Active Member of Clubs or Organizations: Not on file  . Attends Archivist Meetings: Not on file  . Marital Status: Not on file  Intimate Partner Violence:   . Fear of Current or Ex-Partner: Not on file  . Emotionally Abused: Not on file  . Physically Abused: Not on file  . Sexually Abused: Not on file   Social History   Social History Narrative  . Not on file     ROS: Negative.     PE: HEENT: Negative. Lungs: Clear. Cardio: RR.  Assessment/Plan:  Proceed with planned left breast wide excision with SLN exam.    Forest Gleason [facility] 02/04/2020

## 2020-02-05 ENCOUNTER — Encounter: Payer: Self-pay | Admitting: General Surgery

## 2020-02-06 ENCOUNTER — Other Ambulatory Visit: Payer: Self-pay | Admitting: Pathology

## 2020-02-06 LAB — SURGICAL PATHOLOGY

## 2020-02-11 ENCOUNTER — Encounter: Payer: Self-pay | Admitting: *Deleted

## 2020-02-11 NOTE — Progress Notes (Addendum)
Called patient post surgery.  No answer.  Left message for her to return my call.  Patient returned my call.  States she is doing well after surgery.  Stated she only had to take pain meds for 1 day post op.  Informed of need for follow up with Dr. Tasia Catchings.  Stated a team member would call her with an appointment.  Message sent to Dr. Tasia Catchings for team to schedule follow up appointment.  Dr. Tasia Catchings would like to send oncotype Dx.  Form completed and faxed to [place].  Appointment scheduled for 02/26/20 @ 9:30 for final treatment planning.  Called patient back to review appointment and plan.  No answer.  Left message for her to return my call.

## 2020-02-12 ENCOUNTER — Other Ambulatory Visit: Payer: Self-pay | Admitting: General Surgery

## 2020-02-12 DIAGNOSIS — C50412 Malignant neoplasm of upper-outer quadrant of left female breast: Secondary | ICD-10-CM

## 2020-02-12 DIAGNOSIS — Z170 Estrogen receptor positive status [ER+]: Secondary | ICD-10-CM

## 2020-02-13 ENCOUNTER — Ambulatory Visit
Payer: Medicare Other | Attending: Student in an Organized Health Care Education/Training Program | Admitting: Student in an Organized Health Care Education/Training Program

## 2020-02-13 ENCOUNTER — Other Ambulatory Visit: Payer: Self-pay

## 2020-02-13 ENCOUNTER — Encounter: Payer: Self-pay | Admitting: Student in an Organized Health Care Education/Training Program

## 2020-02-13 VITALS — BP 125/60 | HR 76 | Temp 96.8°F | Resp 16 | Ht 64.0 in | Wt 208.0 lb

## 2020-02-13 DIAGNOSIS — Z9889 Other specified postprocedural states: Secondary | ICD-10-CM | POA: Diagnosis present

## 2020-02-13 DIAGNOSIS — M5134 Other intervertebral disc degeneration, thoracic region: Secondary | ICD-10-CM | POA: Diagnosis not present

## 2020-02-13 DIAGNOSIS — M5136 Other intervertebral disc degeneration, lumbar region: Secondary | ICD-10-CM | POA: Insufficient documentation

## 2020-02-13 DIAGNOSIS — M47816 Spondylosis without myelopathy or radiculopathy, lumbar region: Secondary | ICD-10-CM | POA: Diagnosis not present

## 2020-02-13 DIAGNOSIS — G894 Chronic pain syndrome: Secondary | ICD-10-CM | POA: Diagnosis present

## 2020-02-13 MED ORDER — GABAPENTIN 300 MG PO CAPS: 300.0000 mg | ORAL_CAPSULE | Freq: Two times a day (BID) | ORAL | 3 refills | Status: DC

## 2020-02-13 NOTE — Progress Notes (Signed)
Safety precautions to be maintained throughout the outpatient stay will include: orient to surroundings, keep bed in low position, maintain call bell within reach at all times, provide assistance with transfer out of bed and ambulation.  

## 2020-02-13 NOTE — Patient Instructions (Signed)
  Gabapentin:   Take 1 capsule (300 mg total) by mouth 2 (two) times daily.

## 2020-02-13 NOTE — Progress Notes (Signed)
PROVIDER NOTE: Information contained herein reflects review and annotations entered in association with encounter. Interpretation of such information and data should be left to medically-trained personnel. Information provided to patient can be located elsewhere in the medical record under "Patient Instructions". Document created using STT-dictation technology, any transcriptional errors that may result from process are unintentional.    Patient: Tonya Bowen  Service Category: E/M  Provider: Gillis Santa, MD  DOB: [birth year]  DOS: 02/13/2020  Specialty: Interventional Pain Management  MRN: [id number]  Setting: Ambulatory outpatient  PCP: Juluis Pitch, MD  Type: Established Patient    Referring Provider: Juluis Pitch, MD  Location: Office  Delivery: Face-to-face     HPI  [name], a 74 y.o. year old female, is here today because of her S/P kyphoplasty [Z98.890]. Tonya Bowen primary complain today is Back Pain Last encounter: My last encounter with her was on 01/02/2020. Pertinent problems: Tonya Bowen has S/P kyphoplasty; Chronic midline thoracic back pain; Lumbar degenerative disc disease; Lumbar facet arthropathy; Chronic pain syndrome; and Malignant neoplasm of upper-outer quadrant of left breast in female, estrogen receptor positive ([place]) on their pertinent problem list. Pain Assessment: Severity of Chronic pain is reported as a 3 /10. Location: Back Upper, Mid, Lower/denies. Onset: More than a month ago. Quality: Aching. Timing: Intermittent. Modifying factor(s): meds. Vitals:  height is _0  (1.626 m) and weight is 208 lb (94.3 kg). Her temporal temperature is 96.8 F (36 C) (abnormal). Her blood pressure is 125/60 and her pulse is 76. Her respiration is 16 and oxygen saturation is 100%.   Reason for encounter: medication management.   Tonya Bowen presents today for medication management.  Since her last visit with me, patient has had a left lumpectomy that was done on 02/04/2020.  She  has some mild tenderness under her left axilla that radiates posteriorly.  She also has a history of chronic low back pain.  We reviewed her lumbar MRI today which shows L2 compression fracture with retropulsion of vertebral elements into the spinal canal causing severe spinal canal stenosis.  Patient has limited walking ability but states that she has to rest even after she walks a short distance which is very common in patients with lumbar spinal stenosis neurogenic claudication.  Patient is finding benefit with gabapentin at 300 mg nightly.  We discussed increasing her gabapentin to 300 mg twice daily.  Patient would like to trial this.   ROS  Constitutional: Denies any fever or chills Gastrointestinal: No reported hemesis, hematochezia, vomiting, or acute GI distress Musculoskeletal: Left axilla, low back, bilateral hip pain Neurological: No reported episodes of acute onset apraxia, aphasia, dysarthria, agnosia, amnesia, paralysis, loss of coordination, or loss of consciousness  Medication Review  Semaglutide, Vitamin D (Ergocalciferol), acetaminophen, amLODipine, brimonidine-timolol, calcium-vitamin D, cetirizine, gabapentin, lisinopril-hydrochlorothiazide, and omeprazole  History Review  Allergy: Tonya Bowen has No Known Allergies. Drug: Tonya Bowen  reports no history of drug use. Alcohol:  reports current alcohol use of about 2.0 standard drinks of alcohol per week. Tobacco:  reports that she has never smoked. She has never used smokeless tobacco. Social: Ms. Brougher  reports that she has never smoked. She has never used smokeless tobacco. She reports current alcohol use of about 2.0 standard drinks of alcohol per week. She reports that she does not use drugs. Medical:  has a past medical history of Arthritis, Cancer ([place]) (12/27/2019), Chronic back pain, Complication of anesthesia, Diabetes mellitus without complication ([place]), Family history of breast cancer, Family history  of lung cancer,  Family history of uterine cancer, GERD (gastroesophageal reflux disease), Hypertension, Prediabetes, and Stomach ulcer. Surgical: Ms. Devin  has a past surgical history that includes Abdominal hysterectomy; Tonsillectomy; Bunionectomy (Bilateral); Breast enhancement surgery; Cataract extraction (Left); Radial keratotomy; colonoscopy; Upper gi endoscopy; Kyphoplasty (N/A, 02/06/2014); Augmentation mammaplasty (Bilateral, 1987); and Breast lumpectomy with sentinel lymph node bx (Left, 02/04/2020). Family: family history includes ALS in her daughter; Breast cancer in her cousin, cousin, cousin, paternal aunt, paternal aunt, and paternal aunt; Lung cancer in her brother and cousin; Uterine cancer in her mother.  Laboratory Chemistry Profile   Renal Lab Results  Component Value Date   BUN 32 (H) 01/11/2020   CREATININE 1.39 (H) 01/11/2020   GFRAA >90 01/30/2014   GFRNONAA 40 (L) 01/11/2020     Hepatic Lab Results  Component Value Date   AST 26 01/03/2020   ALT 22 01/03/2020   ALBUMIN 4.3 01/03/2020   ALKPHOS 71 01/03/2020     Electrolytes Lab Results  Component Value Date   NA 137 01/11/2020   K 4.2 01/11/2020   CL 101 01/11/2020   CALCIUM 9.5 01/11/2020     Bone No results found for: VD25OH, VD125OH2TOT, ZT2458KD9, IP3825KN3, 25OHVITD1, 25OHVITD2, 25OHVITD3, TESTOFREE, TESTOSTERONE   Inflammation (CRP: Acute Phase) (ESR: Chronic Phase) No results found for: CRP, ESRSEDRATE, LATICACIDVEN     Note: Above Lab results reviewed.  Recent Imaging Review  MM Breast Surgical Specimen CLINICAL DATA:  Post lumpectomy specimen radiograph. 74 year old female with recently diagnosed invasive mammary carcinoma.  EXAM: SPECIMEN RADIOGRAPH OF THE LEFT BREAST  COMPARISON:  Previous exam(s).  FINDINGS: Status post excision of the left breast. The Q biopsy marker clip is present within the specimen.  IMPRESSION: Specimen radiograph of the left breast.  This was confirmed with Dr.  Bary Castilla at 1:04 p.m. on 02/04/2020.  Electronically Signed   By: Audie Pinto M.D.   On: 02/04/2020 13:05 NM Sentinel Node Inj-No Rpt (Breast) Sulfur colloid was injected by the nuclear medicine technologist for  melanoma sentinel node.  Note: Reviewed          EXAM: MRI THORACIC AND LUMBAR SPINE WITHOUT CONTRAST  TECHNIQUE: Multiplanar and multiecho pulse sequences of the thoracic and lumbar spine were obtained without intravenous contrast.  COMPARISON:  None.  FINDINGS: MRI THORACIC SPINE FINDINGS  Alignment:  Physiologic.  Vertebrae: Hemangiomas at T6 and T9.  No thoracic spine fracture.  Cord:  Normal signal and morphology.  Paraspinal and other soft tissues: Negative.  Disc levels:  T1-2: Normal.  T2-3: Normal.  T3-4: Normal.  T4-5: Mild disc space height loss. No herniation. No spinal canal or neural foraminal stenosis.  T5-6: Mild disc height loss without herniation. No foraminal or spinal canal stenosis.  T6-7: Mild disc height loss.  No stenosis.  T7-8: Mild disc desiccation height loss without stenosis or herniation.  T8-9: Disc space narrowing without herniation. No spinal canal or neural foraminal stenosis.  Mild disc height loss.  No herniation or stenosis.  T10-11: Normal disc. Mild facet hypertrophy. No spinal canal or neural foraminal stenosis. Small Schmorl's node at the superior T11 endplate.  T11-12: Normal disc space. No spinal canal or neural foraminal stenosis.  MRI LUMBAR SPINE FINDINGS  Segmentation:  Standard  Alignment:  Normal  Vertebrae: Chronic compression fracture of L2 with 25% central height loss. There is 6 mm of retropulsion of the posterosuperior corner. No bone marrow edema. Status post vertebral augmentation at this level. No other focal osseous abnormality.  Conus medullaris and cauda equina: Conus extends to the L1 level. Conus and cauda equina appear normal.  Paraspinal  and other soft tissues: Negative  Disc levels:  T12-L1: Normal disc and facets.  No stenosis.  L1-L2: Retropulsion of the posterosuperior corner of L2 results in mild spinal canal stenosis. No neural foraminal stenosis.  L2-L3: Normal disc space and facet joints. No spinal canal stenosis. No neural foraminal stenosis.  L3-L4: Normal disc space and facet joints. No spinal canal stenosis. No neural foraminal stenosis.  L4-L5: Small disc bulge with mild bilateral facet hypertrophy. No spinal canal stenosis. No neural foraminal stenosis.  L5-S1: Small left asymmetric disc bulge with minimal facet hypertrophy. No spinal canal stenosis. No neural foraminal stenosis.  Visualized sacrum: Normal.  IMPRESSION: 1. Chronic compression fracture of L2 with 25% central height loss and 6 mm of retropulsion causing mild spinal canal stenosis. 2. Mild thoracic and lumbar degenerative disc disease without other spinal canal or neural foraminal stenosis.   Electronically Signed   By: Ulyses Jarred M.D.   On: 01/23/2020 02:54   Physical Exam  General appearance: Well nourished, well developed, and well hydrated. In no apparent acute distress Mental status: Alert, oriented x 3 (person, place, & time)       Respiratory: No evidence of acute respiratory distress Eyes: PERLA Vitals: BP 125/60   Pulse 76   Temp (!) 96.8 F (36 C) (Temporal)   Resp 16   Ht _0  (1.626 m)   Wt 208 lb (94.3 kg)   SpO2 100%   BMI 35.70 kg/m  BMI: Estimated body mass index is 35.7 kg/m as calculated from the following:   Height as of this encounter: _1  (1.626 m).   Weight as of this encounter: 208 lb (94.3 kg). Ideal: Ideal body weight: 54.7 kg (120 lb 9.5 oz) Adjusted ideal body weight: 70.6 kg (155 lb 8.9 oz)  Cervical Spine Exam  Skin & Axial Inspection: No masses, redness, edema, swelling, or associated skin lesions Alignment: Symmetrical Functional ROM: Unrestricted ROM       Stability: No instability detected Muscle Tone/Strength: Functionally intact. No obvious neuro-muscular anomalies detected. Sensory (Neurological): Unimpaired Palpation: No palpable anomalies                    Upper Extremity (UE) Exam    Side: Right upper extremity  Side: Left upper extremity   Skin & Extremity Inspection: Skin color, temperature, and hair growth are WNL. No peripheral edema or cyanosis. No masses, redness, swelling, asymmetry, or associated skin lesions. No contractures.  Skin & Extremity Inspection: Skin color, temperature, and hair growth are WNL. No peripheral edema or cyanosis. No masses, redness, swelling, asymmetry, or associated skin lesions. No contractures.   Functional ROM: Unrestricted ROM          Functional ROM: Unrestricted ROM           Muscle Tone/Strength: Functionally intact. No obvious neuro-muscular anomalies detected.   Muscle Tone/Strength: Functionally intact. No obvious neuro-muscular anomalies detected.   Sensory (Neurological): Unimpaired          Sensory (Neurological): Unimpaired           Palpation: No palpable anomalies              Palpation: No palpable anomalies               Provocative Test(s):  Phalen's test: deferred Tinel's test: deferred Apley's scratch test (touch opposite shoulder):  Action 1 (  Across chest): deferred Action 2 (Overhead): deferred Action 3 (LB reach): deferred   Provocative Test(s):  Phalen's test: deferred Tinel's test: deferred Apley's scratch test (touch opposite shoulder):  Action 1 (Across chest): deferred Action 2 (Overhead): deferred Action 3 (LB reach): deferred     Thoracic Spine Area Exam  Skin & Axial Inspection: No masses, redness, or swelling Alignment: Symmetrical Functional ROM: Pain restricted ROM Stability: No instability detected Muscle Tone/Strength: Functionally intact. No obvious neuro-muscular anomalies detected. Sensory (Neurological): Musculoskeletal pain  pattern Muscle strength & Tone: Complains of area being tender to palpation  Lumbar Exam  Skin & Axial Inspection: No masses, redness, or swelling Alignment: Symmetrical Functional ROM: Pain restricted ROM       Stability: No instability detected Muscle Tone/Strength: Functionally intact. No obvious neuro-muscular anomalies detected. Sensory (Neurological): Musculoskeletal pain pattern Palpation: Complains of area being tender to palpation         Gait & Posture Assessment  Ambulation: Patient came in today in a wheel chair Gait: Limited. Using assistive device to ambulate Posture: Difficulty standing up straight, due to pain   Lower Extremity Exam    Side: Right lower extremity  Side: Left lower extremity  Stability: No instability observed          Stability: No instability observed          Skin & Extremity Inspection: Skin color, temperature, and hair growth are WNL. No peripheral edema or cyanosis. No masses, redness, swelling, asymmetry, or associated skin lesions. No contractures.  Skin & Extremity Inspection: Skin color, temperature, and hair growth are WNL. No peripheral edema or cyanosis. No masses, redness, swelling, asymmetry, or associated skin lesions. No contractures.  Functional ROM: Unrestricted ROM                  Functional ROM: Unrestricted ROM                  Muscle Tone/Strength: Functionally intact. No obvious neuro-muscular anomalies detected.  Muscle Tone/Strength: Functionally intact. No obvious neuro-muscular anomalies detected.  Sensory (Neurological): Unimpaired        Sensory (Neurological): Unimpaired        DTR: Patellar: deferred today Achilles: deferred today Plantar: deferred today  DTR: Patellar: deferred today Achilles: deferred today Plantar: deferred today  Palpation: No palpable anomalies  Palpation: No palpable anomalies     Assessment   Status Diagnosis  Controlled Controlled Controlled 1. S/P kyphoplasty (L2 done  in 2015 with Dr Rolena Infante)   2. Lumbar degenerative disc disease   3. Lumbar facet arthropathy   4. Other intervertebral disc degeneration, lumbar region   5. Other intervertebral disc degeneration, thoracic region   6. Chronic pain syndrome      Plan of Care  Ms. JASHLEY YELLIN has a current medication list which includes the following long-term medication(s): amlodipine, calcium-vitamin d, cetirizine, gabapentin, lisinopril-hydrochlorothiazide, and omeprazole.  Reviewed MRI with patient in great detail.  Stable compression fracture.  We will continue to monitor.  Increase gabapentin as below.  Follow-up in 3 months.  Pharmacotherapy (Medications Ordered): Meds ordered this encounter  Medications  . gabapentin (NEURONTIN) 300 MG capsule    Sig: Take 1 capsule (300 mg total) by mouth 2 (two) times daily.    Dispense:  60 capsule    Refill:  3   Follow-up plan:   Return in about 3 months (around 05/13/2020) for Medication Management, in person.   Recent Visits Date Type Provider Dept  02/13/20 Office Visit  Gillis Santa, MD [facility]  01/02/20 Office Visit Gillis Santa, MD [facility]  Showing recent visits within past 90 days and meeting all other requirements Future Appointments Date Type Provider Dept  05/13/20 Appointment Gillis Santa, MD [facility]  Showing future appointments within next 90 days and meeting all other requirements  I discussed the assessment and treatment plan with the patient. The patient was provided an opportunity to ask questions and all were answered. The patient agreed with the plan and demonstrated an understanding of the instructions.  Patient advised to call back or seek an in-person evaluation if the symptoms or condition worsens.  Duration of encounter: 20 minutes.  Note by: Gillis Santa, MD Date: 02/13/2020; Time: 10:08 AM

## 2020-02-21 ENCOUNTER — Encounter: Payer: Self-pay | Admitting: Oncology

## 2020-02-26 ENCOUNTER — Ambulatory Visit: Payer: Medicare Other | Admitting: Oncology

## 2020-02-26 ENCOUNTER — Encounter: Payer: Self-pay | Admitting: Oncology

## 2020-02-26 ENCOUNTER — Ambulatory Visit
Admission: RE | Admit: 2020-02-26 | Discharge: 2020-02-26 | Disposition: A | Discharge status: Home / Self Care | Payer: Medicare Other | Source: Ambulatory Visit | Attending: Radiation Oncology | Admitting: Radiation Oncology

## 2020-02-26 ENCOUNTER — Inpatient Hospital Stay: Payer: Medicare Other | Attending: Oncology | Admitting: Oncology

## 2020-02-26 VITALS — BP 116/73 | HR 76 | Temp 98.2°F | Resp 16 | Wt 200.4 lb

## 2020-02-26 DIAGNOSIS — M8580 Other specified disorders of bone density and structure, unspecified site: Secondary | ICD-10-CM | POA: Insufficient documentation

## 2020-02-26 DIAGNOSIS — Z170 Estrogen receptor positive status [ER+]: Secondary | ICD-10-CM | POA: Diagnosis not present

## 2020-02-26 DIAGNOSIS — Z801 Family history of malignant neoplasm of trachea, bronchus and lung: Secondary | ICD-10-CM | POA: Diagnosis not present

## 2020-02-26 DIAGNOSIS — C50412 Malignant neoplasm of upper-outer quadrant of left female breast: Secondary | ICD-10-CM | POA: Insufficient documentation

## 2020-02-26 DIAGNOSIS — M129 Arthropathy, unspecified: Secondary | ICD-10-CM | POA: Diagnosis not present

## 2020-02-26 DIAGNOSIS — Z8041 Family history of malignant neoplasm of ovary: Secondary | ICD-10-CM | POA: Diagnosis not present

## 2020-02-26 DIAGNOSIS — Z79899 Other long term (current) drug therapy: Secondary | ICD-10-CM | POA: Insufficient documentation

## 2020-02-26 DIAGNOSIS — Z803 Family history of malignant neoplasm of breast: Secondary | ICD-10-CM | POA: Diagnosis not present

## 2020-02-26 DIAGNOSIS — K219 Gastro-esophageal reflux disease without esophagitis: Secondary | ICD-10-CM | POA: Diagnosis not present

## 2020-02-26 NOTE — Progress Notes (Signed)
Patient is currently taking antibiotic prescribed by PCP for UTI.

## 2020-02-26 NOTE — Progress Notes (Signed)
Hematology/Oncology Consult note [facility] Telephone:([phone] [phone]   Patient Care Team: Juluis Pitch, MD as PCP - General (Family Medicine) Rico Junker, RN as Registered Nurse  REFERRING PROVIDER: Juluis Pitch, MD  CHIEF COMPLAINTS/REASON FOR VISIT:  Evaluation of breast cancer  HISTORY OF PRESENTING ILLNESS:   Tonya Bowen is a  74 y.o.  female with PMH listed below was seen in consultation at the request of  Juluis Pitch, MD  for evaluation of breast cancer  12/04/2019 Screening Mammogram showed left breast mass.  12/19/2019 Unilateral left breast diagnostic mammogram showed  33m irregular left upper outer breast mass, no left axillary lymphadenopathy.  12/27/2019 She underwent biopsy of left breast mass, pathology showed invasive mammary carcinoma, with mucinous features. Grade 2, no DCIS, no LVI, ER 90%+, PR 90%+, HER2 -  Patient was referred to establish care and discuss management plan.  Family history of breast cancer: paternal aunt x 2 breast cancer Family history of other cancers: brother lung cancer, mother possible uterine cancer  Menarche: 89Menopause: hystectomy in 89sAge at first live childbirth:23 Used OCP: about 10 years Used estrogen and progesterone therapy: 20+ years HRT History of Radiation to the chest: none Previous of breast biopsy: none  INTERVAL HISTORY Tonya MANZOis a 74y.o. female who has above history reviewed by me today presents for follow up visit for management of left breast cancer, ER/PR positive, HER-2 negative. Problems and complaints are listed below: Patient has done genetic testing which was negative. 02/04/2020, patient has underwent left lumpectomy with sentinel lymph node biopsy. 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  DCIS, negative margin. She also had atypical lobular hyperplasia.  3 sentinel lymph nodes excision all  negative. pT2 N0. Oncotype DX recurrence score 5. Today patient presents to follow-up.  She has no concerns with her lumpectomy surgical site.  Denies any pain.   Review of Systems  Constitutional: Negative for appetite change, chills, fatigue and fever.  HENT:   Negative for hearing loss and voice change.   Eyes: Negative for eye problems.  Respiratory: Negative for chest tightness and cough.   Cardiovascular: Negative for chest pain.  Gastrointestinal: Negative for abdominal distention, abdominal pain and blood in stool.  Endocrine: Negative for hot flashes.  Genitourinary: Negative for difficulty urinating and frequency.   Musculoskeletal: Negative for arthralgias.  Skin: Negative for itching and rash.  Neurological: Negative for extremity weakness.  Hematological: Negative for adenopathy.  Psychiatric/Behavioral: Negative for confusion.    MEDICAL HISTORY:  Past Medical History:  Diagnosis Date  . Arthritis   . Cancer ([place] 12/[place]   left breast  . Chronic back pain   . Complication of anesthesia    pt states she felt every thing during breast augmentation surgery  . Diabetes mellitus without complication ([place]   . Family history of breast cancer   . Family history of lung cancer   . Family history of uterine cancer   . GERD (gastroesophageal reflux disease)   . Hypertension   . Prediabetes   . Stomach ulcer     SURGICAL HISTORY: Past Surgical History:  Procedure Laterality Date  . ABDOMINAL HYSTERECTOMY    . AUGMENTATION MAMMAPLASTY Bilateral 1987  . BREAST ENHANCEMENT SURGERY    . BREAST LUMPECTOMY WITH SENTINEL LYMPH NODE BIOPSY Left 02/04/2020   Procedure: BREAST LUMPECTOMY WITH SENTINEL LYMPH NODE BX;  Surgeon: BRobert Bellow MD;  Location: [facility] ORS;  Service: General;  Laterality:  Left;  . BUNIONECTOMY Bilateral    Great toes  . CATARACT EXTRACTION Left   . colonoscopy    . KYPHOPLASTY N/A 02/06/2014   Procedure:  L2 KYPHOPLASTY;  Surgeon:  Melina Schools, MD;  Location: [place];  Service: Orthopedics;  Laterality: N/A;  . RADIAL KERATOTOMY    . TONSILLECTOMY    . UPPER GI ENDOSCOPY      SOCIAL HISTORY: Social History   Socioeconomic History  . Marital status: Divorced    Spouse name: Not on file  . Number of children: Not on file  . Years of education: Not on file  . Highest education level: Not on file  Occupational History  . Not on file  Tobacco Use  . Smoking status: Never Smoker  . Smokeless tobacco: Never Used  Vaping Use  . Vaping Use: Never used  Substance and Sexual Activity  . Alcohol use: Yes    Alcohol/week: 2.0 standard drinks    Types: 2 Glasses of wine per week    Comment: socially  . Drug use: No  . Sexual activity: Not Currently  Other Topics Concern  . Not on file  Social History Narrative  . Not on file   Social Determinants of Health   Financial Resource Strain: Not on file  Food Insecurity: Not on file  Transportation Needs: Not on file  Physical Activity: Not on file  Stress: Not on file  Social Connections: Not on file  Intimate Partner Violence: Not on file    FAMILY HISTORY: Family History  Problem Relation Age of Onset  . Breast cancer Paternal Aunt   . Breast cancer Paternal Aunt   . Uterine cancer Mother        probable  . Lung cancer Brother        metastatic disease; unsure if lung was pimary  . Breast cancer Paternal Aunt   . ALS Daughter   . Breast cancer Cousin        dx 85s  . Breast cancer Cousin        dx 73s  . Breast cancer Cousin        dx 35s  . Lung cancer Cousin     ALLERGIES:  has No Known Allergies.  MEDICATIONS:  Current Outpatient Medications  Medication Sig Dispense Refill  . acetaminophen (TYLENOL) 325 MG tablet Take 650 mg by mouth every 6 (six) hours as needed for moderate pain.     Marland Kitchen amLODipine (NORVASC) 5 MG tablet Take 5 mg by mouth every morning.   1  . brimonidine-timolol (COMBIGAN) 0.2-0.5 % ophthalmic solution Place 1 drop into  the right eye in the morning and at bedtime.    . calcium-vitamin D (OSCAL WITH D) 250-125 MG-UNIT tablet Take 1 tablet by mouth daily.    . cetirizine (ZYRTEC) 10 MG tablet Take 10 mg by mouth every morning.     . gabapentin (NEURONTIN) 300 MG capsule Take 1 capsule (300 mg total) by mouth 2 (two) times daily. 60 capsule 3  . lisinopril-hydrochlorothiazide (ZESTORETIC) 20-12.5 MG tablet Take 1 tablet by mouth every morning.     . nitrofurantoin, macrocrystal-monohydrate, (MACROBID) 100 MG capsule Take by mouth.    Marland Kitchen omeprazole (PRILOSEC) 40 MG capsule Take 1 tablet by mouth every morning.     . Semaglutide (OZEMPIC, 0.25 OR 0.5 MG/DOSE, [place]) Inject 0.25 mg into the skin once a week.     . Vitamin D, Ergocalciferol, (DRISDOL) 1.25 MG (50000 UT) CAPS capsule Take 50,000  Units by mouth every 7 (seven) days.      No current facility-administered medications for this visit.     PHYSICAL EXAMINATION: ECOG PERFORMANCE STATUS: 0 - Asymptomatic Vitals:   02/26/20 1326  BP: 116/73  Pulse: 76  Resp: 16  Temp: 98.2 F (36.8 C)   Filed Weights   02/26/20 1326  Weight: 200 lb 6.4 oz (90.9 kg)    Physical Exam Constitutional:      General: She is not in acute distress. HENT:     Head: Normocephalic and atraumatic.  Eyes:     General: No scleral icterus. Cardiovascular:     Rate and Rhythm: Normal rate and regular rhythm.     Heart sounds: Normal heart sounds.  Pulmonary:     Effort: Pulmonary effort is normal. No respiratory distress.     Breath sounds: No wheezing.  Abdominal:     General: Bowel sounds are normal. There is no distension.     Palpations: Abdomen is soft.  Musculoskeletal:        General: No deformity. Normal range of motion.     Cervical back: Normal range of motion and neck supple.  Skin:    General: Skin is warm and dry.     Findings: No erythema or rash.  Neurological:     Mental Status: She is alert and oriented to person, place, and time. Mental status is at  baseline.     Cranial Nerves: No cranial nerve deficit.     Coordination: Coordination normal.  Psychiatric:        Mood and Affect: Mood normal.      LABORATORY DATA:  I have reviewed the data as listed Lab Results  Component Value Date   WBC 11.0 (H) 01/03/2020   HGB 14.0 01/03/2020   HCT 40.9 01/03/2020   MCV 89.9 01/03/2020   PLT 396 01/03/2020   Recent Labs    01/03/20 1505 01/11/20 1145  NA 138 137  K 4.0 4.2  CL 101 101  CO2 26 29  GLUCOSE 116* 118*  BUN 32* 32*  CREATININE 1.58* 1.39*  CALCIUM 9.2 9.5  GFRNONAA 34* 40*  PROT 7.4  --   ALBUMIN 4.3  --   AST 26  --   ALT 22  --   ALKPHOS 71  --   BILITOT 0.7  --    Iron/TIBC/Ferritin/ %Sat No results found for: IRON, TIBC, FERRITIN, IRONPCTSAT    RADIOGRAPHIC STUDIES: I have personally reviewed the radiological images as listed and agreed with the findings in the report. NM Sentinel Node Inj-No Rpt (Breast)  Result Date: 02/04/2020 Sulfur colloid was injected by the nuclear medicine technologist for melanoma sentinel node.   MM Breast Surgical Specimen  Result Date: 02/04/2020 CLINICAL DATA:  Post lumpectomy specimen radiograph. 74 year old female with recently diagnosed invasive mammary carcinoma. EXAM: SPECIMEN RADIOGRAPH OF THE LEFT BREAST COMPARISON:  Previous exam(s). FINDINGS: Status post excision of the left breast. The Q biopsy marker clip is present within the specimen. IMPRESSION: Specimen radiograph of the left breast. This was confirmed with Dr. Bary Castilla at 1:04 p.m. on 02/04/2020. Electronically Signed   By: Audie Pinto M.D.   On: 02/04/2020 13:05      ASSESSMENT & PLAN:  1. Malignant neoplasm of upper-outer quadrant of left breast in female, estrogen receptor positive ([place])   2. Family history of breast cancer   3. Osteopenia, unspecified location   Cancer Staging Malignant neoplasm of upper-outer quadrant of left breast in female,  estrogen receptor positive ([place]) Staging form:  Breast, AJCC 8th Edition - Clinical: Stage IB (cT2, cN0, cM0, G2, ER+, PR+, HER2-) - Signed by Earlie Server, MD on 01/03/2020 - Pathologic stage from 02/26/2020: Stage IA (pT2, pN0, cM0, G2, ER+, PR+, HER2-, Oncotype DX score: 5) - Signed by Earlie Server, MD on 02/26/2020  Mammogram images and pathology were reviewed and discussed with patient.  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  No chemotherapy will be offered. Case was discussed on the tumor board.   She has appointment with radiation oncology.  Deferred radiation recommendation to Dr. Baruch Gouty Discussed with the patient about adjuvant antiestrogen treatment options, aromatase inhibitor versus tamoxifen. We discussed about rationale and potential side effects of both options.  Osteopenia, 10/30/2019, DEXA showed osteopenia, 10-year risk of any major fracture 50%, 10-year risk of hip fracture 2.3%.  I recommend patient to take calcium and vitamin D supplementation.  If she opts to pursue aromatase inhibitor treatments.  Will need bisphosphonate.  Family history of breast cancer, patient has had genetic testing done which was negative. Patient will follow up with me 2 weeks after finish radiation All questions were answered. The patient knows to call the clinic with any problems questions or concerns.  cc Juluis Pitch, MD    Return of visit: 2 weeks after radiation Earlie Server, MD, PhD Hematology Oncology [facility] Pager- [phone] 02/26/2020

## 2020-02-26 NOTE — Consult Note (Signed)
NEW PATIENT EVALUATION  Name: Tonya Bowen  MRN: [id number]  Date:   02/26/2020     DOB: Mar 10, 1946   This 74 y.o. female patient presents to the clinic for initial evaluation of stage Ib (T2 N0 M0) ER/PR positive HER-2 negative invasive mammary carcinoma the left breast status post wide local excision and sentinel node biopsy.  REFERRING PHYSICIAN: Juluis Pitch, MD  CHIEF COMPLAINT:  Chief Complaint  Patient presents with  . Breast Cancer    DIAGNOSIS: The encounter diagnosis was Malignant neoplasm of upper-outer quadrant of left breast in female, estrogen receptor positive ([place]).   PREVIOUS INVESTIGATIONS:  Mammogram ultrasound and MRI scans reviewed Clinical notes reviewed Pathology report reviewed  HPI: Patient is a 74 year old female who presented with an abnormal mammogram of her left breast showing a 2.2 cm irregular mass in the left upper outer quadrant of the left breast concerning for malignancy.  No suspicious lymph nodes were noted.  She underwent ultrasound-guided biopsy showing invasive mammary carcinoma with mucinous features.  This was followed by wide local excision with sentinel node biopsy by Dr. 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  Tumor was ER/PR positive HER-2/neu not overexpressed.  Margins were clear at 8 mm and for the invasive component and 4 mm for DCIS.  3 sentinel lymph nodes were negative for metastatic disease.  Patient underwent Oncotype DX showing low risk for recurrence.  Patient will benefit from antiestrogen therapy.  She is seen today for opinion.  She is doing well she specifically denies breast tenderness cough or bone pain.  Patient has had MRI scans of her thoracic lumbar spine for back pain she does have a slight compression fracture of L2.  PLANNED TREATMENT REGIMEN: Left whole breast radiation  PAST MEDICAL HISTORY:  has a past medical history of Arthritis, Cancer ([place])  (12/27/2019), Chronic back pain, Complication of anesthesia, Diabetes mellitus without complication ([place]), Family history of breast cancer, Family history of lung cancer, Family history of uterine cancer, GERD (gastroesophageal reflux disease), Hypertension, Prediabetes, and Stomach ulcer.    PAST SURGICAL HISTORY:  Past Surgical History:  Procedure Laterality Date  . ABDOMINAL HYSTERECTOMY    . AUGMENTATION MAMMAPLASTY Bilateral 1987  . BREAST ENHANCEMENT SURGERY    . BREAST LUMPECTOMY WITH SENTINEL LYMPH NODE BIOPSY Left 02/04/2020   Procedure: BREAST LUMPECTOMY WITH SENTINEL LYMPH NODE BX;  Surgeon: Robert Bellow, MD;  Location: [facility] ORS;  Service: General;  Laterality: Left;  . BUNIONECTOMY Bilateral    Great toes  . CATARACT EXTRACTION Left   . colonoscopy    . KYPHOPLASTY N/A 02/06/2014   Procedure:  L2 KYPHOPLASTY;  Surgeon: Melina Schools, MD;  Location: [place];  Service: Orthopedics;  Laterality: N/A;  . RADIAL KERATOTOMY    . TONSILLECTOMY    . UPPER GI ENDOSCOPY      FAMILY HISTORY: family history includes ALS in her daughter; Breast cancer in her cousin, cousin, cousin, paternal aunt, paternal aunt, and paternal aunt; Lung cancer in her brother and cousin; Uterine cancer in her mother.  SOCIAL HISTORY:  reports that she has never smoked. She has never used smokeless tobacco. She reports current alcohol use of about 2.0 standard drinks of alcohol per week. She reports that she does not use drugs.  ALLERGIES: Patient has no known allergies.  MEDICATIONS:  Current Outpatient Medications  Medication Sig Dispense Refill  . acetaminophen (TYLENOL) 325 MG tablet Take 650 mg by mouth every  6 (six) hours as needed for moderate pain.     Marland Kitchen amLODipine (NORVASC) 5 MG tablet Take 5 mg by mouth every morning.   1  . brimonidine-timolol (COMBIGAN) 0.2-0.5 % ophthalmic solution Place 1 drop into the right eye in the morning and at bedtime.    . calcium-vitamin D (OSCAL WITH D) 250-125  MG-UNIT tablet Take 1 tablet by mouth daily.    . cetirizine (ZYRTEC) 10 MG tablet Take 10 mg by mouth every morning.     . gabapentin (NEURONTIN) 300 MG capsule Take 1 capsule (300 mg total) by mouth 2 (two) times daily. 60 capsule 3  . lisinopril-hydrochlorothiazide (ZESTORETIC) 20-12.5 MG tablet Take 1 tablet by mouth every morning.     . nitrofurantoin, macrocrystal-monohydrate, (MACROBID) 100 MG capsule Take by mouth.    Marland Kitchen omeprazole (PRILOSEC) 40 MG capsule Take 1 tablet by mouth every morning.     . Semaglutide (OZEMPIC, 0.25 OR 0.5 MG/DOSE, [place]) Inject 0.25 mg into the skin once a week.     . Vitamin D, Ergocalciferol, (DRISDOL) 1.25 MG (50000 UT) CAPS capsule Take 50,000 Units by mouth every 7 (seven) days.      No current facility-administered medications for this encounter.    ECOG PERFORMANCE STATUS:  0 - Asymptomatic  REVIEW OF SYSTEMS: Patient denies any weight loss, fatigue, weakness, fever, chills or night sweats. Patient denies any loss of vision, blurred vision. Patient denies any ringing  of the ears or hearing loss. No irregular heartbeat. Patient denies heart murmur or history of fainting. Patient denies any chest pain or pain radiating to her upper extremities. Patient denies any shortness of breath, difficulty breathing at night, cough or hemoptysis. Patient denies any swelling in the lower legs. Patient denies any nausea vomiting, vomiting of blood, or coffee ground material in the vomitus. Patient denies any stomach pain. Patient states has had normal bowel movements no significant constipation or diarrhea. Patient denies any dysuria, hematuria or significant nocturia. Patient denies any problems walking, swelling in the joints or loss of balance. Patient denies any skin changes, loss of hair or loss of weight. Patient denies any excessive worrying or anxiety or significant depression. Patient denies any problems with insomnia. Patient denies excessive thirst, polyuria,  polydipsia. Patient denies any swollen glands, patient denies easy bruising or easy bleeding. Patient denies any recent infections, allergies or URI. Patient "s visual fields have not changed significantly in recent time.   PHYSICAL EXAM: There were no vitals taken for this visit. Patient is extremely large breasted.  She is status post wide local excision and sentinel biopsy with scars healing well no dominant mass or nodularity is noted in either breast in 2 positions examined.  No axillary or supraclavicular adenopathy appreciated.  Well-developed well-nourished patient in NAD. HEENT reveals PERLA, EOMI, discs not visualized.  Oral cavity is clear. No oral mucosal lesions are identified. Neck is clear without evidence of cervical or supraclavicular adenopathy. Lungs are clear to A&P. Cardiac examination is essentially unremarkable with regular rate and rhythm without murmur rub or thrill. Abdomen is benign with no organomegaly or masses noted. Motor sensory and DTR levels are equal and symmetric in the upper and lower extremities. Cranial nerves II through XII are grossly intact. Proprioception is intact. No peripheral adenopathy or edema is identified. No motor or sensory levels are noted. Crude visual fields are within normal range.  LABORATORY DATA: Pathology report reviewed    RADIOLOGY RESULTS: Mammogram ultrasound and MRI scans reviewed compatible with  above-stated findings.   IMPRESSION: Stage Ib (T2 N0 M0) ER/PR positive invasive mammary carcinoma with mucinous features status post wide local excision and sentinel node biopsy in 74 year old female  PLAN: At this time I have recommended whole breast radiation.  Based on her large breast volume hypofractionated course of treatment will be difficult.  I would plan on delivering 5040 cGy in 28 fractions boosting her scar another 1400 cGy using electron beam.  Risks and benefits of treatment including skin reaction fatigue alteration of blood  counts possible inclusion of superficial lung all were described in detail to the patient.  I have personally set up and ordered CT simulation for later this week.  Patient also will benefit from antiestrogen therapy after completion of radiation.  Patient comprehends my recommendations well.  I would like to take this opportunity to thank you for allowing me to participate in the care of your patient.Noreene Filbert, MD

## 2020-02-27 ENCOUNTER — Ambulatory Visit: Admission: RE | Admit: 2020-02-27 | Payer: Medicare Other | Source: Ambulatory Visit

## 2020-02-27 ENCOUNTER — Ambulatory Visit: Payer: Medicare Other | Admitting: Oncology

## 2020-02-27 DIAGNOSIS — Z170 Estrogen receptor positive status [ER+]: Secondary | ICD-10-CM | POA: Diagnosis not present

## 2020-02-27 DIAGNOSIS — Z510 Encounter for antineoplastic radiation therapy: Secondary | ICD-10-CM | POA: Insufficient documentation

## 2020-02-27 DIAGNOSIS — C50412 Malignant neoplasm of upper-outer quadrant of left female breast: Secondary | ICD-10-CM | POA: Insufficient documentation

## 2020-02-28 DIAGNOSIS — C50412 Malignant neoplasm of upper-outer quadrant of left female breast: Secondary | ICD-10-CM | POA: Diagnosis not present

## 2020-02-29 ENCOUNTER — Other Ambulatory Visit: Payer: Self-pay | Admitting: *Deleted

## 2020-02-29 DIAGNOSIS — C50412 Malignant neoplasm of upper-outer quadrant of left female breast: Secondary | ICD-10-CM

## 2020-03-05 ENCOUNTER — Ambulatory Visit: Admission: RE | Admit: 2020-03-05 | Payer: Medicare Other | Source: Ambulatory Visit

## 2020-03-05 DIAGNOSIS — C50412 Malignant neoplasm of upper-outer quadrant of left female breast: Secondary | ICD-10-CM | POA: Diagnosis not present

## 2020-03-10 ENCOUNTER — Ambulatory Visit
Admission: RE | Admit: 2020-03-10 | Discharge: 2020-03-10 | Disposition: A | Discharge status: Home / Self Care | Payer: Medicare Other | Source: Ambulatory Visit | Attending: Radiation Oncology | Admitting: Radiation Oncology

## 2020-03-10 DIAGNOSIS — C50412 Malignant neoplasm of upper-outer quadrant of left female breast: Secondary | ICD-10-CM | POA: Diagnosis not present

## 2020-03-11 ENCOUNTER — Ambulatory Visit
Admission: RE | Admit: 2020-03-11 | Discharge: 2020-03-11 | Disposition: A | Discharge status: Home / Self Care | Payer: Medicare Other | Source: Ambulatory Visit | Attending: Radiation Oncology | Admitting: Radiation Oncology

## 2020-03-11 DIAGNOSIS — C50412 Malignant neoplasm of upper-outer quadrant of left female breast: Secondary | ICD-10-CM | POA: Diagnosis not present

## 2020-03-12 ENCOUNTER — Ambulatory Visit
Admission: RE | Admit: 2020-03-12 | Discharge: 2020-03-12 | Disposition: A | Discharge status: Home / Self Care | Payer: Medicare Other | Source: Ambulatory Visit | Attending: Radiation Oncology | Admitting: Radiation Oncology

## 2020-03-12 DIAGNOSIS — C50412 Malignant neoplasm of upper-outer quadrant of left female breast: Secondary | ICD-10-CM | POA: Diagnosis not present

## 2020-03-13 ENCOUNTER — Ambulatory Visit
Admission: RE | Admit: 2020-03-13 | Discharge: 2020-03-13 | Disposition: A | Discharge status: Home / Self Care | Payer: Medicare Other | Source: Ambulatory Visit | Attending: Radiation Oncology | Admitting: Radiation Oncology

## 2020-03-13 DIAGNOSIS — C50412 Malignant neoplasm of upper-outer quadrant of left female breast: Secondary | ICD-10-CM | POA: Diagnosis not present

## 2020-03-17 ENCOUNTER — Ambulatory Visit
Admission: RE | Admit: 2020-03-17 | Discharge: 2020-03-17 | Disposition: A | Discharge status: Home / Self Care | Payer: Medicare HMO | Source: Ambulatory Visit | Attending: Radiation Oncology | Admitting: Radiation Oncology

## 2020-03-17 DIAGNOSIS — Z510 Encounter for antineoplastic radiation therapy: Secondary | ICD-10-CM | POA: Insufficient documentation

## 2020-03-17 DIAGNOSIS — C50412 Malignant neoplasm of upper-outer quadrant of left female breast: Secondary | ICD-10-CM | POA: Diagnosis not present

## 2020-03-17 DIAGNOSIS — Z170 Estrogen receptor positive status [ER+]: Secondary | ICD-10-CM | POA: Insufficient documentation

## 2020-03-18 ENCOUNTER — Ambulatory Visit
Admission: RE | Admit: 2020-03-18 | Discharge: 2020-03-18 | Disposition: A | Discharge status: Home / Self Care | Payer: Medicare HMO | Source: Ambulatory Visit | Attending: Radiation Oncology | Admitting: Radiation Oncology

## 2020-03-18 DIAGNOSIS — Z170 Estrogen receptor positive status [ER+]: Secondary | ICD-10-CM | POA: Diagnosis not present

## 2020-03-18 DIAGNOSIS — Z510 Encounter for antineoplastic radiation therapy: Secondary | ICD-10-CM | POA: Diagnosis not present

## 2020-03-18 DIAGNOSIS — C50412 Malignant neoplasm of upper-outer quadrant of left female breast: Secondary | ICD-10-CM | POA: Diagnosis not present

## 2020-03-19 ENCOUNTER — Ambulatory Visit
Admission: RE | Admit: 2020-03-19 | Discharge: 2020-03-19 | Disposition: A | Discharge status: Home / Self Care | Payer: Medicare HMO | Source: Ambulatory Visit | Attending: Radiation Oncology | Admitting: Radiation Oncology

## 2020-03-19 DIAGNOSIS — C50412 Malignant neoplasm of upper-outer quadrant of left female breast: Secondary | ICD-10-CM | POA: Diagnosis not present

## 2020-03-19 DIAGNOSIS — Z170 Estrogen receptor positive status [ER+]: Secondary | ICD-10-CM | POA: Diagnosis not present

## 2020-03-19 DIAGNOSIS — Z510 Encounter for antineoplastic radiation therapy: Secondary | ICD-10-CM | POA: Diagnosis not present

## 2020-03-20 ENCOUNTER — Ambulatory Visit
Admission: RE | Admit: 2020-03-20 | Discharge: 2020-03-20 | Disposition: A | Discharge status: Home / Self Care | Payer: Medicare HMO | Source: Ambulatory Visit | Attending: Radiation Oncology | Admitting: Radiation Oncology

## 2020-03-20 DIAGNOSIS — Z170 Estrogen receptor positive status [ER+]: Secondary | ICD-10-CM | POA: Diagnosis not present

## 2020-03-20 DIAGNOSIS — C50412 Malignant neoplasm of upper-outer quadrant of left female breast: Secondary | ICD-10-CM | POA: Diagnosis not present

## 2020-03-20 DIAGNOSIS — Z510 Encounter for antineoplastic radiation therapy: Secondary | ICD-10-CM | POA: Diagnosis not present

## 2020-03-21 ENCOUNTER — Ambulatory Visit
Admission: RE | Admit: 2020-03-21 | Discharge: 2020-03-21 | Disposition: A | Discharge status: Home / Self Care | Payer: Medicare HMO | Source: Ambulatory Visit | Attending: Radiation Oncology | Admitting: Radiation Oncology

## 2020-03-21 DIAGNOSIS — Z170 Estrogen receptor positive status [ER+]: Secondary | ICD-10-CM | POA: Diagnosis not present

## 2020-03-21 DIAGNOSIS — Z510 Encounter for antineoplastic radiation therapy: Secondary | ICD-10-CM | POA: Diagnosis not present

## 2020-03-21 DIAGNOSIS — C50412 Malignant neoplasm of upper-outer quadrant of left female breast: Secondary | ICD-10-CM | POA: Diagnosis not present

## 2020-03-24 ENCOUNTER — Ambulatory Visit
Admission: RE | Admit: 2020-03-24 | Discharge: 2020-03-24 | Disposition: A | Discharge status: Home / Self Care | Payer: Medicare HMO | Source: Ambulatory Visit | Attending: Radiation Oncology | Admitting: Radiation Oncology

## 2020-03-24 DIAGNOSIS — Z510 Encounter for antineoplastic radiation therapy: Secondary | ICD-10-CM | POA: Diagnosis not present

## 2020-03-24 DIAGNOSIS — C50412 Malignant neoplasm of upper-outer quadrant of left female breast: Secondary | ICD-10-CM | POA: Diagnosis not present

## 2020-03-24 DIAGNOSIS — Z170 Estrogen receptor positive status [ER+]: Secondary | ICD-10-CM | POA: Diagnosis not present

## 2020-03-25 ENCOUNTER — Ambulatory Visit
Admission: RE | Admit: 2020-03-25 | Discharge: 2020-03-25 | Disposition: A | Discharge status: Home / Self Care | Payer: Medicare HMO | Source: Ambulatory Visit | Attending: Radiation Oncology | Admitting: Radiation Oncology

## 2020-03-25 DIAGNOSIS — Z170 Estrogen receptor positive status [ER+]: Secondary | ICD-10-CM | POA: Diagnosis not present

## 2020-03-25 DIAGNOSIS — Z510 Encounter for antineoplastic radiation therapy: Secondary | ICD-10-CM | POA: Diagnosis not present

## 2020-03-25 DIAGNOSIS — C50412 Malignant neoplasm of upper-outer quadrant of left female breast: Secondary | ICD-10-CM | POA: Diagnosis not present

## 2020-03-26 ENCOUNTER — Ambulatory Visit
Admission: RE | Admit: 2020-03-26 | Discharge: 2020-03-26 | Disposition: A | Discharge status: Home / Self Care | Payer: Medicare HMO | Source: Ambulatory Visit | Attending: Radiation Oncology | Admitting: Radiation Oncology

## 2020-03-26 ENCOUNTER — Inpatient Hospital Stay: Payer: Medicare HMO | Attending: Radiation Oncology

## 2020-03-26 DIAGNOSIS — Z170 Estrogen receptor positive status [ER+]: Secondary | ICD-10-CM | POA: Diagnosis not present

## 2020-03-26 DIAGNOSIS — C50412 Malignant neoplasm of upper-outer quadrant of left female breast: Secondary | ICD-10-CM | POA: Insufficient documentation

## 2020-03-26 DIAGNOSIS — Z510 Encounter for antineoplastic radiation therapy: Secondary | ICD-10-CM | POA: Diagnosis not present

## 2020-03-26 LAB — CBC
HCT: 39.6 % (ref 36.0–46.0)
Hemoglobin: 13.4 g/dL (ref 12.0–15.0)
MCH: 30.9 pg (ref 26.0–34.0)
MCHC: 33.8 g/dL (ref 30.0–36.0)
MCV: 91.5 fL (ref 80.0–100.0)
Platelets: 296 10*3/uL (ref 150–400)
RBC: 4.33 MIL/uL (ref 3.87–5.11)
RDW: 12.3 % (ref 11.5–15.5)
WBC: 8 10*3/uL (ref 4.0–10.5)
nRBC: 0 % (ref 0.0–0.2)

## 2020-03-27 ENCOUNTER — Ambulatory Visit
Admission: RE | Admit: 2020-03-27 | Discharge: 2020-03-27 | Disposition: A | Discharge status: Home / Self Care | Payer: Medicare HMO | Source: Ambulatory Visit | Attending: Radiation Oncology | Admitting: Radiation Oncology

## 2020-03-27 DIAGNOSIS — Z510 Encounter for antineoplastic radiation therapy: Secondary | ICD-10-CM | POA: Diagnosis not present

## 2020-03-27 DIAGNOSIS — Z170 Estrogen receptor positive status [ER+]: Secondary | ICD-10-CM | POA: Diagnosis not present

## 2020-03-27 DIAGNOSIS — C50412 Malignant neoplasm of upper-outer quadrant of left female breast: Secondary | ICD-10-CM | POA: Diagnosis not present

## 2020-03-28 ENCOUNTER — Ambulatory Visit
Admission: RE | Admit: 2020-03-28 | Discharge: 2020-03-28 | Disposition: A | Discharge status: Home / Self Care | Payer: Medicare HMO | Source: Ambulatory Visit | Attending: Radiation Oncology | Admitting: Radiation Oncology

## 2020-03-28 DIAGNOSIS — Z510 Encounter for antineoplastic radiation therapy: Secondary | ICD-10-CM | POA: Diagnosis not present

## 2020-03-28 DIAGNOSIS — C50412 Malignant neoplasm of upper-outer quadrant of left female breast: Secondary | ICD-10-CM | POA: Diagnosis not present

## 2020-03-28 DIAGNOSIS — Z170 Estrogen receptor positive status [ER+]: Secondary | ICD-10-CM | POA: Diagnosis not present

## 2020-03-31 ENCOUNTER — Ambulatory Visit
Admission: RE | Admit: 2020-03-31 | Discharge: 2020-03-31 | Disposition: A | Discharge status: Home / Self Care | Payer: Medicare HMO | Source: Ambulatory Visit | Attending: Radiation Oncology | Admitting: Radiation Oncology

## 2020-03-31 DIAGNOSIS — C50412 Malignant neoplasm of upper-outer quadrant of left female breast: Secondary | ICD-10-CM | POA: Diagnosis not present

## 2020-03-31 DIAGNOSIS — Z510 Encounter for antineoplastic radiation therapy: Secondary | ICD-10-CM | POA: Diagnosis not present

## 2020-03-31 DIAGNOSIS — Z170 Estrogen receptor positive status [ER+]: Secondary | ICD-10-CM | POA: Diagnosis not present

## 2020-04-01 ENCOUNTER — Ambulatory Visit
Admission: RE | Admit: 2020-04-01 | Discharge: 2020-04-01 | Disposition: A | Discharge status: Home / Self Care | Payer: Medicare HMO | Source: Ambulatory Visit | Attending: Radiation Oncology | Admitting: Radiation Oncology

## 2020-04-01 DIAGNOSIS — Z510 Encounter for antineoplastic radiation therapy: Secondary | ICD-10-CM | POA: Diagnosis not present

## 2020-04-01 DIAGNOSIS — C50412 Malignant neoplasm of upper-outer quadrant of left female breast: Secondary | ICD-10-CM | POA: Diagnosis not present

## 2020-04-01 DIAGNOSIS — Z170 Estrogen receptor positive status [ER+]: Secondary | ICD-10-CM | POA: Diagnosis not present

## 2020-04-02 ENCOUNTER — Ambulatory Visit
Admission: RE | Admit: 2020-04-02 | Discharge: 2020-04-02 | Disposition: A | Discharge status: Home / Self Care | Payer: Medicare HMO | Source: Ambulatory Visit | Attending: Radiation Oncology | Admitting: Radiation Oncology

## 2020-04-02 DIAGNOSIS — Z510 Encounter for antineoplastic radiation therapy: Secondary | ICD-10-CM | POA: Diagnosis not present

## 2020-04-02 DIAGNOSIS — Z170 Estrogen receptor positive status [ER+]: Secondary | ICD-10-CM | POA: Diagnosis not present

## 2020-04-02 DIAGNOSIS — C50412 Malignant neoplasm of upper-outer quadrant of left female breast: Secondary | ICD-10-CM | POA: Diagnosis not present

## 2020-04-03 ENCOUNTER — Ambulatory Visit
Admission: RE | Admit: 2020-04-03 | Discharge: 2020-04-03 | Disposition: A | Discharge status: Home / Self Care | Payer: Medicare HMO | Source: Ambulatory Visit | Attending: Radiation Oncology | Admitting: Radiation Oncology

## 2020-04-03 DIAGNOSIS — Z510 Encounter for antineoplastic radiation therapy: Secondary | ICD-10-CM | POA: Diagnosis not present

## 2020-04-03 DIAGNOSIS — C50412 Malignant neoplasm of upper-outer quadrant of left female breast: Secondary | ICD-10-CM | POA: Diagnosis not present

## 2020-04-03 DIAGNOSIS — Z170 Estrogen receptor positive status [ER+]: Secondary | ICD-10-CM | POA: Diagnosis not present

## 2020-04-04 ENCOUNTER — Ambulatory Visit
Admission: RE | Admit: 2020-04-04 | Discharge: 2020-04-04 | Disposition: A | Discharge status: Home / Self Care | Payer: Medicare HMO | Source: Ambulatory Visit | Attending: Radiation Oncology | Admitting: Radiation Oncology

## 2020-04-04 DIAGNOSIS — Z510 Encounter for antineoplastic radiation therapy: Secondary | ICD-10-CM | POA: Diagnosis not present

## 2020-04-04 DIAGNOSIS — C50412 Malignant neoplasm of upper-outer quadrant of left female breast: Secondary | ICD-10-CM | POA: Diagnosis not present

## 2020-04-04 DIAGNOSIS — Z170 Estrogen receptor positive status [ER+]: Secondary | ICD-10-CM | POA: Diagnosis not present

## 2020-04-07 ENCOUNTER — Ambulatory Visit
Admission: RE | Admit: 2020-04-07 | Discharge: 2020-04-07 | Disposition: A | Discharge status: Home / Self Care | Payer: Medicare HMO | Source: Ambulatory Visit | Attending: Radiation Oncology | Admitting: Radiation Oncology

## 2020-04-07 DIAGNOSIS — C50412 Malignant neoplasm of upper-outer quadrant of left female breast: Secondary | ICD-10-CM | POA: Diagnosis not present

## 2020-04-07 DIAGNOSIS — Z170 Estrogen receptor positive status [ER+]: Secondary | ICD-10-CM | POA: Diagnosis not present

## 2020-04-07 DIAGNOSIS — Z510 Encounter for antineoplastic radiation therapy: Secondary | ICD-10-CM | POA: Diagnosis not present

## 2020-04-08 ENCOUNTER — Ambulatory Visit
Admission: RE | Admit: 2020-04-08 | Discharge: 2020-04-08 | Disposition: A | Discharge status: Home / Self Care | Payer: Medicare HMO | Source: Ambulatory Visit | Attending: Radiation Oncology | Admitting: Radiation Oncology

## 2020-04-08 DIAGNOSIS — Z510 Encounter for antineoplastic radiation therapy: Secondary | ICD-10-CM | POA: Diagnosis not present

## 2020-04-08 DIAGNOSIS — Z170 Estrogen receptor positive status [ER+]: Secondary | ICD-10-CM | POA: Diagnosis not present

## 2020-04-08 DIAGNOSIS — C50412 Malignant neoplasm of upper-outer quadrant of left female breast: Secondary | ICD-10-CM | POA: Diagnosis not present

## 2020-04-09 ENCOUNTER — Inpatient Hospital Stay: Payer: Medicare HMO

## 2020-04-09 ENCOUNTER — Ambulatory Visit
Admission: RE | Admit: 2020-04-09 | Discharge: 2020-04-09 | Disposition: A | Discharge status: Home / Self Care | Payer: Medicare HMO | Source: Ambulatory Visit | Attending: Radiation Oncology | Admitting: Radiation Oncology

## 2020-04-09 ENCOUNTER — Other Ambulatory Visit: Payer: Self-pay

## 2020-04-09 DIAGNOSIS — C50412 Malignant neoplasm of upper-outer quadrant of left female breast: Secondary | ICD-10-CM

## 2020-04-09 DIAGNOSIS — Z170 Estrogen receptor positive status [ER+]: Secondary | ICD-10-CM | POA: Diagnosis not present

## 2020-04-09 DIAGNOSIS — Z510 Encounter for antineoplastic radiation therapy: Secondary | ICD-10-CM | POA: Diagnosis not present

## 2020-04-09 LAB — CBC
HCT: 39.2 % (ref 36.0–46.0)
Hemoglobin: 13.4 g/dL (ref 12.0–15.0)
MCH: 31.1 pg (ref 26.0–34.0)
MCHC: 34.2 g/dL (ref 30.0–36.0)
MCV: 91 fL (ref 80.0–100.0)
Platelets: 261 10*3/uL (ref 150–400)
RBC: 4.31 MIL/uL (ref 3.87–5.11)
RDW: 12 % (ref 11.5–15.5)
WBC: 7.8 10*3/uL (ref 4.0–10.5)
nRBC: 0 % (ref 0.0–0.2)

## 2020-04-10 ENCOUNTER — Ambulatory Visit
Admission: RE | Admit: 2020-04-10 | Discharge: 2020-04-10 | Disposition: A | Discharge status: Home / Self Care | Payer: Medicare HMO | Source: Ambulatory Visit | Attending: Radiation Oncology | Admitting: Radiation Oncology

## 2020-04-10 DIAGNOSIS — Z510 Encounter for antineoplastic radiation therapy: Secondary | ICD-10-CM | POA: Diagnosis not present

## 2020-04-10 DIAGNOSIS — Z170 Estrogen receptor positive status [ER+]: Secondary | ICD-10-CM | POA: Diagnosis not present

## 2020-04-10 DIAGNOSIS — C50412 Malignant neoplasm of upper-outer quadrant of left female breast: Secondary | ICD-10-CM | POA: Diagnosis not present

## 2020-04-11 ENCOUNTER — Ambulatory Visit
Admission: RE | Admit: 2020-04-11 | Discharge: 2020-04-11 | Disposition: A | Discharge status: Home / Self Care | Payer: Medicare HMO | Source: Ambulatory Visit | Attending: Radiation Oncology | Admitting: Radiation Oncology

## 2020-04-11 DIAGNOSIS — Z510 Encounter for antineoplastic radiation therapy: Secondary | ICD-10-CM | POA: Diagnosis not present

## 2020-04-11 DIAGNOSIS — Z170 Estrogen receptor positive status [ER+]: Secondary | ICD-10-CM | POA: Diagnosis not present

## 2020-04-11 DIAGNOSIS — C50412 Malignant neoplasm of upper-outer quadrant of left female breast: Secondary | ICD-10-CM | POA: Diagnosis not present

## 2020-04-14 ENCOUNTER — Ambulatory Visit
Admission: RE | Admit: 2020-04-14 | Discharge: 2020-04-14 | Disposition: A | Discharge status: Home / Self Care | Payer: Medicare HMO | Source: Ambulatory Visit | Attending: Radiation Oncology | Admitting: Radiation Oncology

## 2020-04-14 DIAGNOSIS — C50412 Malignant neoplasm of upper-outer quadrant of left female breast: Secondary | ICD-10-CM | POA: Diagnosis not present

## 2020-04-14 DIAGNOSIS — Z170 Estrogen receptor positive status [ER+]: Secondary | ICD-10-CM | POA: Diagnosis not present

## 2020-04-14 DIAGNOSIS — Z510 Encounter for antineoplastic radiation therapy: Secondary | ICD-10-CM | POA: Diagnosis not present

## 2020-04-15 ENCOUNTER — Ambulatory Visit
Admission: RE | Admit: 2020-04-15 | Discharge: 2020-04-15 | Disposition: A | Discharge status: Home / Self Care | Payer: Medicare HMO | Source: Ambulatory Visit | Attending: Radiation Oncology | Admitting: Radiation Oncology

## 2020-04-15 DIAGNOSIS — Z170 Estrogen receptor positive status [ER+]: Secondary | ICD-10-CM | POA: Diagnosis not present

## 2020-04-15 DIAGNOSIS — Z510 Encounter for antineoplastic radiation therapy: Secondary | ICD-10-CM | POA: Diagnosis not present

## 2020-04-15 DIAGNOSIS — C50412 Malignant neoplasm of upper-outer quadrant of left female breast: Secondary | ICD-10-CM | POA: Diagnosis not present

## 2020-04-16 ENCOUNTER — Ambulatory Visit
Admission: RE | Admit: 2020-04-16 | Discharge: 2020-04-16 | Disposition: A | Discharge status: Home / Self Care | Payer: Medicare HMO | Source: Ambulatory Visit | Attending: Radiation Oncology | Admitting: Radiation Oncology

## 2020-04-16 DIAGNOSIS — Z510 Encounter for antineoplastic radiation therapy: Secondary | ICD-10-CM | POA: Diagnosis not present

## 2020-04-16 DIAGNOSIS — C50412 Malignant neoplasm of upper-outer quadrant of left female breast: Secondary | ICD-10-CM | POA: Diagnosis not present

## 2020-04-16 DIAGNOSIS — Z170 Estrogen receptor positive status [ER+]: Secondary | ICD-10-CM | POA: Diagnosis not present

## 2020-04-17 ENCOUNTER — Ambulatory Visit
Admission: RE | Admit: 2020-04-17 | Discharge: 2020-04-17 | Disposition: A | Discharge status: Home / Self Care | Payer: Medicare HMO | Source: Ambulatory Visit | Attending: Radiation Oncology | Admitting: Radiation Oncology

## 2020-04-17 DIAGNOSIS — Z510 Encounter for antineoplastic radiation therapy: Secondary | ICD-10-CM | POA: Diagnosis not present

## 2020-04-17 DIAGNOSIS — Z170 Estrogen receptor positive status [ER+]: Secondary | ICD-10-CM | POA: Diagnosis not present

## 2020-04-17 DIAGNOSIS — C50412 Malignant neoplasm of upper-outer quadrant of left female breast: Secondary | ICD-10-CM | POA: Diagnosis not present

## 2020-04-18 ENCOUNTER — Ambulatory Visit
Admission: RE | Admit: 2020-04-18 | Discharge: 2020-04-18 | Disposition: A | Discharge status: Home / Self Care | Payer: Medicare HMO | Source: Ambulatory Visit | Attending: Radiation Oncology | Admitting: Radiation Oncology

## 2020-04-18 DIAGNOSIS — Z510 Encounter for antineoplastic radiation therapy: Secondary | ICD-10-CM | POA: Diagnosis not present

## 2020-04-18 DIAGNOSIS — C50412 Malignant neoplasm of upper-outer quadrant of left female breast: Secondary | ICD-10-CM | POA: Diagnosis not present

## 2020-04-18 DIAGNOSIS — Z170 Estrogen receptor positive status [ER+]: Secondary | ICD-10-CM | POA: Diagnosis not present

## 2020-04-21 ENCOUNTER — Ambulatory Visit
Admission: RE | Admit: 2020-04-21 | Discharge: 2020-04-21 | Disposition: A | Discharge status: Home / Self Care | Payer: Medicare HMO | Source: Ambulatory Visit | Attending: Radiation Oncology | Admitting: Radiation Oncology

## 2020-04-21 DIAGNOSIS — C50412 Malignant neoplasm of upper-outer quadrant of left female breast: Secondary | ICD-10-CM | POA: Diagnosis not present

## 2020-04-21 DIAGNOSIS — H35351 Cystoid macular degeneration, right eye: Secondary | ICD-10-CM | POA: Diagnosis not present

## 2020-04-21 DIAGNOSIS — Z170 Estrogen receptor positive status [ER+]: Secondary | ICD-10-CM | POA: Diagnosis not present

## 2020-04-21 DIAGNOSIS — Z510 Encounter for antineoplastic radiation therapy: Secondary | ICD-10-CM | POA: Diagnosis not present

## 2020-04-21 DIAGNOSIS — H40051 Ocular hypertension, right eye: Secondary | ICD-10-CM | POA: Diagnosis not present

## 2020-04-22 ENCOUNTER — Telehealth: Payer: Self-pay

## 2020-04-22 ENCOUNTER — Ambulatory Visit
Admission: RE | Admit: 2020-04-22 | Discharge: 2020-04-22 | Disposition: A | Discharge status: Home / Self Care | Payer: Medicare HMO | Source: Ambulatory Visit | Attending: Radiation Oncology | Admitting: Radiation Oncology

## 2020-04-22 DIAGNOSIS — C50412 Malignant neoplasm of upper-outer quadrant of left female breast: Secondary | ICD-10-CM | POA: Diagnosis not present

## 2020-04-22 DIAGNOSIS — Z510 Encounter for antineoplastic radiation therapy: Secondary | ICD-10-CM | POA: Diagnosis not present

## 2020-04-22 DIAGNOSIS — Z170 Estrogen receptor positive status [ER+]: Secondary | ICD-10-CM | POA: Diagnosis not present

## 2020-04-22 NOTE — Telephone Encounter (Signed)
[name] scheduled for final radiation treatment on 2/15. Please schedule her for MD only- 2 weeks after final tx and notify pt of appt.

## 2020-04-23 ENCOUNTER — Other Ambulatory Visit: Payer: Self-pay

## 2020-04-23 ENCOUNTER — Inpatient Hospital Stay: Payer: Medicare HMO | Attending: Radiation Oncology

## 2020-04-23 ENCOUNTER — Ambulatory Visit
Admission: RE | Admit: 2020-04-23 | Discharge: 2020-04-23 | Disposition: A | Discharge status: Home / Self Care | Payer: Medicare HMO | Source: Ambulatory Visit | Attending: Radiation Oncology | Admitting: Radiation Oncology

## 2020-04-23 DIAGNOSIS — Z510 Encounter for antineoplastic radiation therapy: Secondary | ICD-10-CM | POA: Diagnosis not present

## 2020-04-23 DIAGNOSIS — C50412 Malignant neoplasm of upper-outer quadrant of left female breast: Secondary | ICD-10-CM | POA: Diagnosis not present

## 2020-04-23 DIAGNOSIS — Z170 Estrogen receptor positive status [ER+]: Secondary | ICD-10-CM | POA: Diagnosis not present

## 2020-04-23 LAB — CBC
HCT: 38.8 % (ref 36.0–46.0)
Hemoglobin: 13.1 g/dL (ref 12.0–15.0)
MCH: 30.7 pg (ref 26.0–34.0)
MCHC: 33.8 g/dL (ref 30.0–36.0)
MCV: 90.9 fL (ref 80.0–100.0)
Platelets: 271 10*3/uL (ref 150–400)
RBC: 4.27 MIL/uL (ref 3.87–5.11)
RDW: 12.1 % (ref 11.5–15.5)
WBC: 8.8 10*3/uL (ref 4.0–10.5)
nRBC: 0 % (ref 0.0–0.2)

## 2020-04-23 NOTE — Telephone Encounter (Signed)
Done.. Pt will RTC on 05/14/20 MD only @ 2:00

## 2020-04-24 ENCOUNTER — Ambulatory Visit
Admission: RE | Admit: 2020-04-24 | Discharge: 2020-04-24 | Disposition: A | Discharge status: Home / Self Care | Payer: Medicare HMO | Source: Ambulatory Visit | Attending: Radiation Oncology | Admitting: Radiation Oncology

## 2020-04-24 DIAGNOSIS — Z170 Estrogen receptor positive status [ER+]: Secondary | ICD-10-CM | POA: Diagnosis not present

## 2020-04-24 DIAGNOSIS — C50412 Malignant neoplasm of upper-outer quadrant of left female breast: Secondary | ICD-10-CM | POA: Diagnosis not present

## 2020-04-24 DIAGNOSIS — Z510 Encounter for antineoplastic radiation therapy: Secondary | ICD-10-CM | POA: Diagnosis not present

## 2020-04-25 ENCOUNTER — Ambulatory Visit
Admission: RE | Admit: 2020-04-25 | Discharge: 2020-04-25 | Disposition: A | Discharge status: Home / Self Care | Payer: Medicare HMO | Source: Ambulatory Visit | Attending: Radiation Oncology | Admitting: Radiation Oncology

## 2020-04-25 DIAGNOSIS — Z510 Encounter for antineoplastic radiation therapy: Secondary | ICD-10-CM | POA: Diagnosis not present

## 2020-04-25 DIAGNOSIS — C50412 Malignant neoplasm of upper-outer quadrant of left female breast: Secondary | ICD-10-CM | POA: Diagnosis not present

## 2020-04-25 DIAGNOSIS — Z170 Estrogen receptor positive status [ER+]: Secondary | ICD-10-CM | POA: Diagnosis not present

## 2020-04-28 ENCOUNTER — Ambulatory Visit
Admission: RE | Admit: 2020-04-28 | Discharge: 2020-04-28 | Disposition: A | Discharge status: Home / Self Care | Payer: Medicare HMO | Source: Ambulatory Visit | Attending: Radiation Oncology | Admitting: Radiation Oncology

## 2020-04-28 DIAGNOSIS — Z170 Estrogen receptor positive status [ER+]: Secondary | ICD-10-CM | POA: Diagnosis not present

## 2020-04-28 DIAGNOSIS — C50412 Malignant neoplasm of upper-outer quadrant of left female breast: Secondary | ICD-10-CM | POA: Diagnosis not present

## 2020-04-28 DIAGNOSIS — Z510 Encounter for antineoplastic radiation therapy: Secondary | ICD-10-CM | POA: Diagnosis not present

## 2020-04-29 ENCOUNTER — Ambulatory Visit
Admission: RE | Admit: 2020-04-29 | Discharge: 2020-04-29 | Disposition: A | Discharge status: Home / Self Care | Payer: Medicare HMO | Source: Ambulatory Visit | Attending: Radiation Oncology | Admitting: Radiation Oncology

## 2020-04-29 DIAGNOSIS — Z170 Estrogen receptor positive status [ER+]: Secondary | ICD-10-CM | POA: Diagnosis not present

## 2020-04-29 DIAGNOSIS — C50412 Malignant neoplasm of upper-outer quadrant of left female breast: Secondary | ICD-10-CM | POA: Diagnosis not present

## 2020-04-29 DIAGNOSIS — Z510 Encounter for antineoplastic radiation therapy: Secondary | ICD-10-CM | POA: Diagnosis not present

## 2020-05-05 DIAGNOSIS — H903 Sensorineural hearing loss, bilateral: Secondary | ICD-10-CM | POA: Diagnosis not present

## 2020-05-05 DIAGNOSIS — R42 Dizziness and giddiness: Secondary | ICD-10-CM | POA: Diagnosis not present

## 2020-05-12 ENCOUNTER — Encounter: Payer: Self-pay | Admitting: Student in an Organized Health Care Education/Training Program

## 2020-05-12 ENCOUNTER — Other Ambulatory Visit: Payer: Self-pay

## 2020-05-12 ENCOUNTER — Ambulatory Visit
Payer: Medicare HMO | Attending: Student in an Organized Health Care Education/Training Program | Admitting: Student in an Organized Health Care Education/Training Program

## 2020-05-12 VITALS — BP 143/59 | HR 72 | Temp 97.0°F | Resp 16 | Ht 64.0 in | Wt 208.0 lb

## 2020-05-12 DIAGNOSIS — Z9889 Other specified postprocedural states: Secondary | ICD-10-CM | POA: Diagnosis not present

## 2020-05-12 DIAGNOSIS — G894 Chronic pain syndrome: Secondary | ICD-10-CM | POA: Insufficient documentation

## 2020-05-12 DIAGNOSIS — M47816 Spondylosis without myelopathy or radiculopathy, lumbar region: Secondary | ICD-10-CM | POA: Diagnosis not present

## 2020-05-12 DIAGNOSIS — M5136 Other intervertebral disc degeneration, lumbar region: Secondary | ICD-10-CM | POA: Insufficient documentation

## 2020-05-12 MED ORDER — GABAPENTIN 300 MG PO CAPS: 300.0000 mg | ORAL_CAPSULE | Freq: Two times a day (BID) | ORAL | 3 refills | Status: DC

## 2020-05-12 NOTE — Progress Notes (Signed)
PROVIDER NOTE: Information contained herein reflects review and annotations entered in association with encounter. Interpretation of such information and data should be left to medically-trained personnel. Information provided to patient can be located elsewhere in the medical record under "Patient Instructions". Document created using STT-dictation technology, any transcriptional errors that may result from process are unintentional.    Patient: Tonya Bowen  Service Category: E/M  Provider: Gillis Santa, MD  DOB: 07-01-1945  DOS: 05/12/2020  Specialty: Interventional Pain Management  MRN: [id number]  Setting: Ambulatory outpatient  PCP: Juluis Pitch, MD  Type: Established Patient    Referring Provider: Juluis Pitch, MD  Location: Office  Delivery: Face-to-face     HPI  Tonya Bowen, a 75 y.o. year old female, is here today because of her S/P kyphoplasty [Z98.890]. Tonya Bowen primary complain today is Back Pain (lower) Last encounter: My last encounter with her was on 02/13/2020. Pertinent problems: Tonya Bowen has S/P kyphoplasty; Chronic midline thoracic back pain; Lumbar degenerative disc disease; Lumbar facet arthropathy; Chronic pain syndrome; and Malignant neoplasm of upper-outer quadrant of left breast in female, estrogen receptor positive ([place]) on their pertinent problem list. Pain Assessment: Severity of Chronic pain is reported as a 3 /10. Location: Back Lower/denies. Onset: More than a month ago. Quality: Aching. Timing: Intermittent. Modifying factor(s): sitting down. Vitals:  height is '5\' 4"'  (1.626 m) and weight is 208 lb (94.3 kg). Her temporal temperature is 97 F (36.1 C) (abnormal). Her blood pressure is 143/59 (abnormal) and her pulse is 72. Her respiration is 16 and oxygen saturation is 100%.   Reason for encounter: medication management.    Patient follows up today for management of gabapentin.  She has completed radiation treatments for left breast cancer.  She is going  to be placed on hormone therapy.  She is hoping that her energy level improves since completing her radiation treatments.  Otherwise she continues gabapentin as prescribed, 300 mg twice a day which seems to help manage her pain and allows her to function more comfortably.  Refill as below.  Follow-up in 4 months.   ROS  Constitutional: Denies any fever or chills Gastrointestinal: No reported hemesis, hematochezia, vomiting, or acute GI distress Musculoskeletal: Positive low back pain Neurological: No reported episodes of acute onset apraxia, aphasia, dysarthria, agnosia, amnesia, paralysis, loss of coordination, or loss of consciousness  Medication Review  Semaglutide, Vitamin D (Ergocalciferol), acetaminophen, amLODipine, brimonidine-timolol, calcium-vitamin D, cetirizine, gabapentin, lisinopril-hydrochlorothiazide, and omeprazole  History Review  Allergy: Tonya Bowen has No Known Allergies. Drug: Tonya Bowen  reports no history of drug use. Alcohol:  reports current alcohol use of about 2.0 standard drinks of alcohol per week. Tobacco:  reports that she has never smoked. She has never used smokeless tobacco. Social: Tonya Bowen  reports that she has never smoked. She has never used smokeless tobacco. She reports current alcohol use of about 2.0 standard drinks of alcohol per week. She reports that she does not use drugs. Medical:  has a past medical history of Arthritis, Cancer ([place]) (12/27/2019), Chronic back pain, Complication of anesthesia, Diabetes mellitus without complication ([place]), Family history of breast cancer, Family history of lung cancer, Family history of uterine cancer, GERD (gastroesophageal reflux disease), Hypertension, Prediabetes, and Stomach ulcer. Surgical: Tonya Bowen  has a past surgical history that includes Abdominal hysterectomy; Tonsillectomy; Bunionectomy (Bilateral); Breast enhancement surgery; Cataract extraction (Left); Radial keratotomy; colonoscopy; Upper gi endoscopy;  Kyphoplasty (N/A, 02/06/2014); Augmentation mammaplasty (Bilateral, 1987); and Breast lumpectomy with sentinel lymph  node bx (Left, 02/04/2020). Family: family history includes ALS in her daughter; Breast cancer in her cousin, cousin, cousin, paternal aunt, paternal aunt, and paternal aunt; Lung cancer in her brother and cousin; Uterine cancer in her mother.  Laboratory Chemistry Profile   Renal Lab Results  Component Value Date   BUN 32 (H) 01/11/2020   CREATININE 1.39 (H) 01/11/2020   GFRAA >90 01/30/2014   GFRNONAA 40 (L) 01/11/2020     Hepatic Lab Results  Component Value Date   AST 26 01/03/2020   ALT 22 01/03/2020   ALBUMIN 4.3 01/03/2020   ALKPHOS 71 01/03/2020     Electrolytes Lab Results  Component Value Date   NA 137 01/11/2020   K 4.2 01/11/2020   CL 101 01/11/2020   CALCIUM 9.5 01/11/2020     Bone No results found for: VD25OH, VD125OH2TOT, HK7425ZD6, LO7564PP2, 25OHVITD1, 25OHVITD2, 25OHVITD3, TESTOFREE, TESTOSTERONE   Inflammation (CRP: Acute Phase) (ESR: Chronic Phase) No results found for: CRP, ESRSEDRATE, LATICACIDVEN     Note: Above Lab results reviewed.  Recent Imaging Review  MM Breast Surgical Specimen CLINICAL DATA:  Post lumpectomy specimen radiograph. 75 year old female with recently diagnosed invasive mammary carcinoma.  EXAM: SPECIMEN RADIOGRAPH OF THE LEFT BREAST  COMPARISON:  Previous exam(s).  FINDINGS: Status post excision of the left breast. The Q biopsy marker clip is present within the specimen.  IMPRESSION: Specimen radiograph of the left breast.  This was confirmed with Dr. Bary Castilla at 1:04 p.m. on 02/04/2020.  Electronically Signed   By: Audie Pinto M.D.   On: 02/04/2020 13:05 NM Sentinel Node Inj-No Rpt (Breast) Sulfur colloid was injected by the nuclear medicine technologist for  melanoma sentinel node.  Note: Reviewed        Physical Exam  General appearance: Well nourished, well developed, and well hydrated.  In no apparent acute distress Mental status: Alert, oriented x 3 (person, place, & time)       Respiratory: No evidence of acute respiratory distress Eyes: PERLA Vitals: BP (!) 143/59 (BP Location: Left Arm, Patient Position: Sitting, Cuff Size: Normal)    Pulse 72    Temp (!) 97 F (36.1 C) (Temporal)    Resp 16    Ht '5\' 4"'  (1.626 m)    Wt 208 lb (94.3 kg)    SpO2 100%    BMI 35.70 kg/m  BMI: Estimated body mass index is 35.7 kg/m as calculated from the following:   Height as of this encounter: '5\' 4"'  (1.626 m).   Weight as of this encounter: 208 lb (94.3 kg). Ideal: Ideal body weight: 54.7 kg (120 lb 9.5 oz) Adjusted ideal body weight: 70.6 kg (155 lb 8.9 oz)   Lumbar Exam  Skin & Axial Inspection:No masses, redness, or swelling Alignment:Symmetrical Functional RJJ:OACZ restricted ROM Stability:No instability detected Muscle Tone/Strength:Functionally intact. No obvious neuro-muscular anomalies detected. Sensory (Neurological):Musculoskeletal pain pattern Palpation:Complains of area being tender to palpation   Gait & Posture Assessment  Ambulation:Patient came in today in a wheel chair Gait:Limited. Using assistive device to ambulate Posture:Difficulty standing up straight, due to pain  Lower Extremity Exam    Side:Right lower extremity  Side:Left lower extremity  Stability:No instability observed  Stability:No instability observed  Skin & Extremity Inspection:Skin color, temperature, and hair growth are WNL. No peripheral edema or cyanosis. No masses, redness, swelling, asymmetry, or associated skin lesions. No contractures.  Skin & Extremity Inspection:Skin color, temperature, and hair growth are WNL. No peripheral edema or cyanosis. No masses, redness, swelling, asymmetry,  or associated skin lesions. No contractures.  Functional FAW:NOPWKHIPRKSY ROM   Functional SDB:NRWKETIJFTZO ROM    Muscle Tone/Strength:Functionally intact. No obvious neuro-muscular anomalies detected.  Muscle Tone/Strength:Functionally intact. No obvious neuro-muscular anomalies detected.  Sensory (Neurological):Unimpaired  Sensory (Neurological):Unimpaired  DTR: Patellar:deferred today Achilles:deferred today Plantar:deferred today  DTR: Patellar:deferred today Achilles:deferred today Plantar:deferred today  Palpation:No palpable anomalies  Palpation:No palpable anomalies    Assessment   Status Diagnosis  Controlled Controlled Controlled 1. S/P kyphoplasty (L2 done in 2015 with Dr Rolena Infante)   2. Lumbar degenerative disc disease   3. Lumbar facet arthropathy   4. Other intervertebral disc degeneration, lumbar region   5. Chronic pain syndrome       Plan of Care  Tonya Bowen has a current medication list which includes the following long-term medication(s): amlodipine, calcium-vitamin d, cetirizine, lisinopril-hydrochlorothiazide, omeprazole, and gabapentin.  Pharmacotherapy (Medications Ordered): Meds ordered this encounter  Medications   gabapentin (NEURONTIN) 300 MG capsule    Sig: Take 1 capsule (300 mg total) by mouth 2 (two) times daily.    Dispense:  60 capsule    Refill:  3   Follow-up plan:   Return in about 4 months (around 09/09/2020) for Medication Management.   Recent Visits Date Type Provider Dept  02/13/20 Office Visit Gillis Santa, MD [facility]  Showing recent visits within past 90 days and meeting all other requirements Today's Visits Date Type Provider Dept  05/12/20 Office Visit Gillis Santa, MD [facility]  Showing today's visits and meeting all other requirements Future Appointments No visits were found meeting these conditions. Showing future appointments within next 90 days and meeting all other requirements  I discussed the assessment and treatment plan with the patient. The patient was provided  an opportunity to ask questions and all were answered. The patient agreed with the plan and demonstrated an understanding of the instructions.  Patient advised to call back or seek an in-person evaluation if the symptoms or condition worsens.  Duration of encounter:15 minutes.  Note by: Gillis Santa, MD Date: 05/12/2020; Time: 1:52 PM

## 2020-05-13 ENCOUNTER — Encounter: Payer: Medicare Other | Admitting: Student in an Organized Health Care Education/Training Program

## 2020-05-14 ENCOUNTER — Encounter: Payer: Self-pay | Admitting: Oncology

## 2020-05-14 ENCOUNTER — Other Ambulatory Visit: Payer: Self-pay

## 2020-05-14 ENCOUNTER — Inpatient Hospital Stay: Payer: Medicare HMO | Attending: Oncology | Admitting: Oncology

## 2020-05-14 VITALS — BP 124/79 | HR 75 | Temp 97.6°F | Resp 18 | Wt 208.4 lb

## 2020-05-14 DIAGNOSIS — Z801 Family history of malignant neoplasm of trachea, bronchus and lung: Secondary | ICD-10-CM | POA: Diagnosis not present

## 2020-05-14 DIAGNOSIS — Z170 Estrogen receptor positive status [ER+]: Secondary | ICD-10-CM

## 2020-05-14 DIAGNOSIS — Z923 Personal history of irradiation: Secondary | ICD-10-CM | POA: Insufficient documentation

## 2020-05-14 DIAGNOSIS — C50412 Malignant neoplasm of upper-outer quadrant of left female breast: Secondary | ICD-10-CM

## 2020-05-14 DIAGNOSIS — Z803 Family history of malignant neoplasm of breast: Secondary | ICD-10-CM | POA: Diagnosis not present

## 2020-05-14 DIAGNOSIS — M8580 Other specified disorders of bone density and structure, unspecified site: Secondary | ICD-10-CM

## 2020-05-14 MED ORDER — ANASTROZOLE 1 MG PO TABS: 1.0000 mg | ORAL_TABLET | Freq: Every day | ORAL | 2 refills | Status: DC

## 2020-05-14 NOTE — Progress Notes (Signed)
Pt here for follow up. No new concern voiced.

## 2020-05-14 NOTE — Progress Notes (Signed)
Survivorship Care Plan visit completed.  Treatment summary reviewed and given to patient.  ASCO answers booklet reviewed and given to patient.  CARE program and Cancer Transitions discussed with patient along with other resources cancer center offers to patients and caregivers.  Patient verbalized understanding.    

## 2020-05-14 NOTE — Progress Notes (Signed)
Hematology/Oncology Consult note [facility] [phone] [phone]   Patient Care Team: Juluis Pitch, MD as PCP - General (Family Medicine) Rico Junker, RN as Registered Nurse Bary Castilla, Forest Gleason, MD as Consulting Physician (General Surgery) Noreene Filbert, MD as Referring Physician (Radiation Oncology) Earlie Server, MD as Consulting Physician (Oncology)  REFERRING PROVIDER: Juluis Pitch, MD  CHIEF COMPLAINTS/REASON FOR VISIT:  Evaluation of breast cancer  HISTORY OF PRESENTING ILLNESS:   Tonya Bowen is a  75 y.o.  female with PMH listed below was seen in consultation at the request of  Juluis Pitch, MD  for evaluation of breast cancer  12/04/2019 Screening Mammogram showed left breast mass.  12/19/2019 Unilateral left breast diagnostic mammogram showed  35m irregular left upper outer breast mass, no left axillary lymphadenopathy.  12/27/2019 She underwent biopsy of left breast mass, pathology showed invasive mammary carcinoma, with mucinous features. Grade 2, no DCIS, no LVI, ER 90%+, PR 90%+, HER2 -  Patient was referred to establish care and discuss management plan.  Family history of breast cancer: paternal aunt x 2 breast cancer Family history of other cancers: brother lung cancer, mother possible uterine cancer  Menarche: 89Menopause: hystectomy in 480sAge at first live childbirth:23 Used OCP: about 10 years Used estrogen and progesterone therapy: 20+ years HRT History of Radiation to the chest: none Previous of breast biopsy: none   # 02/04/2020, patient has underwent left lumpectomy with sentinel lymph node biopsy. 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  DCIS, negative margin. She also had atypical lobular hyperplasia.  3 sentinel lymph nodes excision all negative. pT2 N0. Oncotype DX recurrence score 5.  No adjuvant chemotherapy was offered.  #Genetic testing was done  and was negative. #04/29/2020, finished adjuvant radiation therapy  INTERVAL HISTORY Tonya CARRASCOis a 75y.o. female who has above history reviewed by me today presents for follow up visit for management of left breast cancer, ER/PR positive, HER-2 negative. Problems and complaints are listed below: Patient finished adjuvant radiation 2 weeks ago.  Tolerates well.  There is some focal breast swelling and skin hyperpigmentation.  Review of Systems  Constitutional: Negative for appetite change, chills, fatigue and fever.  HENT:   Negative for hearing loss and voice change.   Eyes: Negative for eye problems.  Respiratory: Negative for chest tightness and cough.   Cardiovascular: Negative for chest pain.  Gastrointestinal: Negative for abdominal distention, abdominal pain and blood in stool.  Endocrine: Negative for hot flashes.  Genitourinary: Negative for difficulty urinating and frequency.   Musculoskeletal: Negative for arthralgias.  Skin: Negative for itching and rash.  Neurological: Negative for extremity weakness.  Hematological: Negative for adenopathy.  Psychiatric/Behavioral: Negative for confusion.    MEDICAL HISTORY:  Past Medical History:  Diagnosis Date  . Arthritis   . [place]   left breast  . Chronic back pain   . Complication of anesthesia    pt states she felt every thing during breast augmentation surgery  . Diabetes mellitus without complication ([place]   . Family history of breast cancer   . Family history of lung cancer   . Family history of uterine cancer   . GERD (gastroesophageal reflux disease)   . Hypertension   . Prediabetes   . Stomach ulcer     SURGICAL HISTORY: Past Surgical History:  Procedure Laterality Date  . ABDOMINAL HYSTERECTOMY    . AUGMENTATION MAMMAPLASTY Bilateral 1987  . BREAST ENHANCEMENT SURGERY    .  BREAST LUMPECTOMY WITH SENTINEL LYMPH NODE BIOPSY Left 02/04/2020   Procedure: BREAST LUMPECTOMY WITH SENTINEL  LYMPH NODE BX;  Surgeon: Robert Bellow, MD;  Location: [facility];  Service: General;  Laterality: Left;  . BUNIONECTOMY Bilateral    Great toes  . CATARACT EXTRACTION Left   . colonoscopy    . KYPHOPLASTY N/A 02/06/2014   Procedure:  L2 KYPHOPLASTY;  Surgeon: Melina Schools, MD;  Location: [facility];  Service: Orthopedics;  Laterality: N/A;  . RADIAL KERATOTOMY    . TONSILLECTOMY    . UPPER GI ENDOSCOPY      SOCIAL HISTORY: Social History   Socioeconomic History  . Marital status: Divorced    Spouse name: Not on file  . Number of children: Not on file  . Years of education: Not on file  . Highest education level: Not on file  Occupational History  . Not on file  Tobacco Use  . Smoking status: Never Smoker  . Smokeless tobacco: Never Used  Vaping Use  . Vaping Use: Never used  Substance and Sexual Activity  . Alcohol use: Yes    Alcohol/week: 2.0 standard drinks    Types: 2 Glasses of wine per week    Comment: socially  . Drug use: No  . Sexual activity: Not Currently  Other Topics Concern  . Not on file  Social History Narrative  . Not on file   Social Determinants of Health   Financial Resource Strain: Not on file  Food Insecurity: Not on file  Transportation Needs: Not on file  Physical Activity: Not on file  Stress: Not on file  Social Connections: Not on file  Intimate Partner Violence: Not on file    FAMILY HISTORY: Family History  Problem Relation Age of Onset  . Breast cancer Paternal Aunt   . Breast cancer Paternal Aunt   . Uterine cancer Mother        probable  . Lung cancer Brother        metastatic disease; unsure if lung was pimary  . Breast cancer Paternal Aunt   . ALS Daughter   . Breast cancer Cousin        dx 81s  . Breast cancer Cousin        dx 73s  . Breast cancer Cousin        dx 4s  . Lung cancer Cousin     ALLERGIES:  has No Known Allergies.  MEDICATIONS:  Current Outpatient Medications  Medication Sig Dispense Refill   . acetaminophen (TYLENOL) 325 MG tablet Take 650 mg by mouth every 6 (six) hours as needed for moderate pain.     Marland Kitchen anastrozole (ARIMIDEX) 1 MG tablet Take 1 tablet (1 mg total) by mouth daily. 30 tablet 2  . brimonidine-timolol (COMBIGAN) 0.2-0.5 % ophthalmic solution Place 1 drop into the right eye in the morning and at bedtime.    . calcium-vitamin D (OSCAL WITH D) 250-125 MG-UNIT tablet Take 1 tablet by mouth daily.    . cetirizine (ZYRTEC) 10 MG tablet Take 10 mg by mouth every morning.     . gabapentin (NEURONTIN) 300 MG capsule Take 1 capsule (300 mg total) by mouth 2 (two) times daily. 60 capsule 3  . lisinopril-hydrochlorothiazide (ZESTORETIC) 20-12.5 MG tablet Take 1 tablet by mouth every morning.     Marland Kitchen omeprazole (PRILOSEC) 40 MG capsule Take 1 tablet by mouth every morning.     . Semaglutide (OZEMPIC, 0.25 OR 0.5 MG/DOSE, [place]) Inject 0.25 mg  into the skin once a week.     . vitamin C (ASCORBIC ACID) 500 MG tablet Take 500 mg by mouth daily.    . Vitamin D, Ergocalciferol, (DRISDOL) 1.25 MG (50000 UT) CAPS capsule Take 50,000 Units by mouth every 7 (seven) days.     Marland Kitchen amLODipine (NORVASC) 5 MG tablet Take 5 mg by mouth every morning.  (Patient not taking: Reported on 05/14/2020)  1   No current facility-administered medications for this visit.     PHYSICAL EXAMINATION: ECOG PERFORMANCE STATUS: 0 - Asymptomatic Vitals:   05/14/20 1333  BP: 124/79  Pulse: 75  Resp: 18  Temp: 97.6 F (36.4 C)   Filed Weights   05/14/20 1333  Weight: 208 lb 6.4 oz (94.5 kg)    Physical Exam Constitutional:      General: She is not in acute distress. HENT:     Head: Normocephalic and atraumatic.  Eyes:     General: No scleral icterus. Cardiovascular:     Rate and Rhythm: Normal rate and regular rhythm.     Heart sounds: Normal heart sounds.  Pulmonary:     Effort: Pulmonary effort is normal. No respiratory distress.     Breath sounds: No wheezing.  Abdominal:     General: Bowel  sounds are normal. There is no distension.     Palpations: Abdomen is soft.  Musculoskeletal:        General: No deformity. Normal range of motion.     Cervical back: Normal range of motion and neck supple.  Skin:    General: Skin is warm and dry.     Findings: No erythema or rash.  Neurological:     Mental Status: She is alert and oriented to person, place, and time. Mental status is at baseline.     Cranial Nerves: No cranial nerve deficit.     Coordination: Coordination normal.  Psychiatric:        Mood and Affect: Mood normal.   Left breast examination was performed.  Focal erythema/hyperpigmentation of left breast skin.   LABORATORY DATA:  I have reviewed the data as listed Lab Results  Component Value Date   WBC 8.8 04/23/2020   HGB 13.1 04/23/2020   HCT 38.8 04/23/2020   MCV 90.9 04/23/2020   PLT 271 04/23/2020   Recent Labs    01/03/20 1505 01/11/20 1145  NA 138 137  K 4.0 4.2  CL 101 101  CO2 26 29  GLUCOSE 116* 118*  BUN 32* 32*  CREATININE 1.58* 1.39*  CALCIUM 9.2 9.5  GFRNONAA 34* 40*  PROT 7.4  --   ALBUMIN 4.3  --   AST 26  --   ALT 22  --   ALKPHOS 71  --   BILITOT 0.7  --    Iron/TIBC/Ferritin/ %Sat No results found for: IRON, TIBC, FERRITIN, IRONPCTSAT    RADIOGRAPHIC STUDIES: I have personally reviewed the radiological images as listed and agreed with the findings in the report. No results found.    ASSESSMENT & PLAN:  1. Malignant neoplasm of upper-outer quadrant of left breast in female, estrogen receptor positive ([place])   2. Osteopenia, unspecified location   Cancer Staging Malignant neoplasm of upper-outer quadrant of left breast in female, estrogen receptor positive ([place]) Staging form: Breast, AJCC 8th Edition - Clinical: Stage IB (cT2, cN0, cM0, G2, ER+, PR+, HER2-) - Signed by Earlie Server, MD on 01/03/2020 - Pathologic stage from 02/26/2020: Stage IA (pT2, pN0, cM0, G2, ER+, PR+, HER2-,  Oncotype DX score: 5) - Signed by Earlie Server,  MD on 02/26/2020  Mammogram images and pathology were reviewed and discussed with patient.  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  No chemotherapy will be offered.-Finished adjuvant radiation. Discussed the rationale and potential side effects of adjuvant antiestrogen treatments. Rationale of using aromatase inhibitor -Arimidex vs tamoxifen were discussed with patient.  Agrees with proceeding with aromatase inhibitor with monitoring of bone density. Side effects of Arimidex including but not limited to hot flush, joint pain, fatigue, mood swing, bone loss and increased risk of pathological fracture discussed with patient. Patient voices understanding and willing to proceed.  Prescription was sent to pharmacy.  Osteopenia, 10/30/2019, DEXA showed osteopenia, 10-year risk of any major fracture 15%, 10-year risk of hip fracture 2.3%.  Recommend patient to take calcium 1200 mg daily and vitamin D supplementation. Discussed about the rationale of bisphosphonate.  Recommend patient to obtain dental clearance.   Family history of breast cancer, patient has had genetic testing done which was negative.  All questions were answered. The patient knows to call the clinic with any problems questions or concerns.  cc Juluis Pitch, MD    Return of visit: 4 weeks for evaluation of tolerability   Earlie Server, MD, PhD Hematology Oncology [facility] Pager- [phone] 05/14/2020

## 2020-05-15 DIAGNOSIS — R42 Dizziness and giddiness: Secondary | ICD-10-CM | POA: Diagnosis not present

## 2020-06-02 ENCOUNTER — Ambulatory Visit: Payer: Medicare HMO | Admitting: Radiation Oncology

## 2020-06-02 DIAGNOSIS — H3091 Unspecified chorioretinal inflammation, right eye: Secondary | ICD-10-CM | POA: Diagnosis not present

## 2020-06-04 DIAGNOSIS — E538 Deficiency of other specified B group vitamins: Secondary | ICD-10-CM | POA: Diagnosis not present

## 2020-06-04 DIAGNOSIS — G629 Polyneuropathy, unspecified: Secondary | ICD-10-CM | POA: Diagnosis not present

## 2020-06-04 DIAGNOSIS — N1832 Chronic kidney disease, stage 3b: Secondary | ICD-10-CM | POA: Diagnosis not present

## 2020-06-04 DIAGNOSIS — E559 Vitamin D deficiency, unspecified: Secondary | ICD-10-CM | POA: Diagnosis not present

## 2020-06-04 DIAGNOSIS — E531 Pyridoxine deficiency: Secondary | ICD-10-CM | POA: Diagnosis not present

## 2020-06-04 DIAGNOSIS — R2689 Other abnormalities of gait and mobility: Secondary | ICD-10-CM | POA: Diagnosis not present

## 2020-06-04 DIAGNOSIS — E519 Thiamine deficiency, unspecified: Secondary | ICD-10-CM | POA: Diagnosis not present

## 2020-06-04 DIAGNOSIS — E119 Type 2 diabetes mellitus without complications: Secondary | ICD-10-CM | POA: Diagnosis not present

## 2020-06-18 ENCOUNTER — Inpatient Hospital Stay: Payer: Medicare HMO | Admitting: Oncology

## 2020-06-18 ENCOUNTER — Inpatient Hospital Stay: Payer: Medicare HMO

## 2020-06-24 DIAGNOSIS — H35351 Cystoid macular degeneration, right eye: Secondary | ICD-10-CM | POA: Diagnosis not present

## 2020-06-24 DIAGNOSIS — H3091 Unspecified chorioretinal inflammation, right eye: Secondary | ICD-10-CM | POA: Diagnosis not present

## 2020-06-25 ENCOUNTER — Ambulatory Visit
Admission: RE | Admit: 2020-06-25 | Discharge: 2020-06-25 | Disposition: A | Discharge status: Home / Self Care | Payer: Medicare HMO | Source: Ambulatory Visit | Attending: Radiation Oncology | Admitting: Radiation Oncology

## 2020-06-25 ENCOUNTER — Other Ambulatory Visit: Payer: Self-pay

## 2020-06-25 DIAGNOSIS — Z170 Estrogen receptor positive status [ER+]: Secondary | ICD-10-CM | POA: Diagnosis not present

## 2020-06-25 DIAGNOSIS — C50412 Malignant neoplasm of upper-outer quadrant of left female breast: Secondary | ICD-10-CM | POA: Diagnosis not present

## 2020-06-25 DIAGNOSIS — Z79811 Long term (current) use of aromatase inhibitors: Secondary | ICD-10-CM | POA: Insufficient documentation

## 2020-06-25 DIAGNOSIS — Z923 Personal history of irradiation: Secondary | ICD-10-CM | POA: Diagnosis not present

## 2020-06-25 NOTE — Progress Notes (Signed)
Radiation Oncology Follow up Note  Name: Tonya Bowen   Date:   06/25/2020 MRN:  [id number] DOB: Jan 04, 1946    This 75 y.o. female presents to the clinic today for 1 month follow-up status post whole breast radiation to her left breast for stage Ib (T2 N0 M0) ER/PR positive invasive mammary carcinoma.  REFERRING PROVIDER: Juluis Pitch, MD  HPI: Patient is a 75 year old female now at 1 month having completed whole breast radiation to her left breast for stage Ib ER/PR positive invasive mammary carcinoma.  Seen today in routine follow-up she is doing well.  She specifically denies breast tenderness cough or bone pain..  She has been started on Arimidex as tolerated well at this point.  COMPLICATIONS OF TREATMENT: none  FOLLOW UP COMPLIANCE: keeps appointments   PHYSICAL EXAM:  BP (P) 130/75 (BP Location: Left Arm, Patient Position: Sitting)   Pulse (P) 81   Temp (!) (P) 97.2 F (36.2 C) (Tympanic)   Resp (P) 16   Wt (P) 211 lb 12.8 oz (96.1 kg)   BMI (P) 36.36 kg/m  Lungs are clear to A&P cardiac examination essentially unremarkable with regular rate and rhythm. No dominant mass or nodularity is noted in either breast in 2 positions examined. Incision is well-healed. No axillary or supraclavicular adenopathy is appreciated. Cosmetic result is excellent.  Well-developed well-nourished patient in NAD. HEENT reveals PERLA, EOMI, discs not visualized.  Oral cavity is clear. No oral mucosal lesions are identified. Neck is clear without evidence of cervical or supraclavicular adenopathy. Lungs are clear to A&P. Cardiac examination is essentially unremarkable with regular rate and rhythm without murmur rub or thrill. Abdomen is benign with no organomegaly or masses noted. Motor sensory and DTR levels are equal and symmetric in the upper and lower extremities. Cranial nerves II through XII are grossly intact. Proprioception is intact. No peripheral adenopathy or edema is identified. No motor or  sensory levels are noted. Crude visual fields are within normal range.  RADIOLOGY RESULTS: No current films for review  PLAN: Present time patient is doing well recovering nicely from her radiation therapy treatments.  And pleased with her overall progress.  She continues on Arimidex without side effect.  I have asked to see her back in 4 to 5 months for follow-up.  Patient knows to call with any concerns.  I would like to take this opportunity to thank you for allowing me to participate in the care of your patient.Noreene Filbert, MD

## 2020-07-01 ENCOUNTER — Encounter: Payer: Self-pay | Admitting: Oncology

## 2020-07-01 ENCOUNTER — Inpatient Hospital Stay (HOSPITAL_BASED_OUTPATIENT_CLINIC_OR_DEPARTMENT_OTHER): Payer: Medicare HMO | Admitting: Oncology

## 2020-07-01 ENCOUNTER — Inpatient Hospital Stay: Payer: Medicare HMO | Attending: Oncology

## 2020-07-01 ENCOUNTER — Other Ambulatory Visit: Payer: Self-pay

## 2020-07-01 VITALS — BP 112/73 | HR 61 | Temp 98.0°F | Resp 18 | Wt 210.4 lb

## 2020-07-01 DIAGNOSIS — M8580 Other specified disorders of bone density and structure, unspecified site: Secondary | ICD-10-CM

## 2020-07-01 DIAGNOSIS — G8929 Other chronic pain: Secondary | ICD-10-CM | POA: Insufficient documentation

## 2020-07-01 DIAGNOSIS — C50412 Malignant neoplasm of upper-outer quadrant of left female breast: Secondary | ICD-10-CM | POA: Diagnosis not present

## 2020-07-01 DIAGNOSIS — Z79811 Long term (current) use of aromatase inhibitors: Secondary | ICD-10-CM

## 2020-07-01 DIAGNOSIS — M549 Dorsalgia, unspecified: Secondary | ICD-10-CM | POA: Insufficient documentation

## 2020-07-01 DIAGNOSIS — Z170 Estrogen receptor positive status [ER+]: Secondary | ICD-10-CM

## 2020-07-01 DIAGNOSIS — Z923 Personal history of irradiation: Secondary | ICD-10-CM | POA: Diagnosis not present

## 2020-07-01 LAB — CBC WITH DIFFERENTIAL/PLATELET
Abs Immature Granulocytes: 0.03 10*3/uL (ref 0.00–0.07)
Basophils Absolute: 0 10*3/uL (ref 0.0–0.1)
Basophils Relative: 1 %
Eosinophils Absolute: 0.2 10*3/uL (ref 0.0–0.5)
Eosinophils Relative: 3 %
HCT: 39.4 % (ref 36.0–46.0)
Hemoglobin: 13 g/dL (ref 12.0–15.0)
Immature Granulocytes: 0 %
Lymphocytes Relative: 13 %
Lymphs Abs: 0.9 10*3/uL (ref 0.7–4.0)
MCH: 30.3 pg (ref 26.0–34.0)
MCHC: 33 g/dL (ref 30.0–36.0)
MCV: 91.8 fL (ref 80.0–100.0)
Monocytes Absolute: 0.6 10*3/uL (ref 0.1–1.0)
Monocytes Relative: 9 %
Neutro Abs: 5.4 10*3/uL (ref 1.7–7.7)
Neutrophils Relative %: 74 %
Platelets: 288 10*3/uL (ref 150–400)
RBC: 4.29 MIL/uL (ref 3.87–5.11)
RDW: 12.4 % (ref 11.5–15.5)
WBC: 7.2 10*3/uL (ref 4.0–10.5)
nRBC: 0 % (ref 0.0–0.2)

## 2020-07-01 LAB — COMPREHENSIVE METABOLIC PANEL
ALT: 17 U/L (ref 0–44)
AST: 18 U/L (ref 15–41)
Albumin: 4 g/dL (ref 3.5–5.0)
Alkaline Phosphatase: 75 U/L (ref 38–126)
Anion gap: 11 (ref 5–15)
BUN: 28 mg/dL — ABNORMAL HIGH (ref 8–23)
CO2: 28 mmol/L (ref 22–32)
Calcium: 9.1 mg/dL (ref 8.9–10.3)
Chloride: 102 mmol/L (ref 98–111)
Creatinine, Ser: 1.28 mg/dL — ABNORMAL HIGH (ref 0.44–1.00)
GFR, Estimated: 44 mL/min — ABNORMAL LOW (ref >60)
Glucose, Bld: 86 mg/dL (ref 70–99)
Potassium: 4.2 mmol/L (ref 3.5–5.1)
Sodium: 141 mmol/L (ref 135–145)
Total Bilirubin: 0.6 mg/dL (ref 0.3–1.2)
Total Protein: 7 g/dL (ref 6.5–8.1)

## 2020-07-01 NOTE — Progress Notes (Signed)
Patient here for follow up.Pt reports that she had some hot flashes and insomnia, but side effects have improved.

## 2020-07-01 NOTE — Progress Notes (Signed)
Hematology/Oncology follow-up note [facility] [phone] [phone]   Patient Care Team: Juluis Pitch, MD as PCP - General (Family Medicine) Rico Junker, RN as Registered Nurse Bary Castilla, Forest Gleason, MD as Consulting Physician (General Surgery) Noreene Filbert, MD as Referring Physician (Radiation Oncology) Earlie Server, MD as Consulting Physician (Oncology)  REFERRING PROVIDER: Juluis Pitch, MD  CHIEF COMPLAINTS/REASON FOR VISIT:  Follow-up for breast cancer management  HISTORY OF PRESENTING ILLNESS:   Tonya Bowen is a  75 y.o.  female with PMH listed below was seen in consultation at the request of  Juluis Pitch, MD  for evaluation of breast cancer  12/04/2019 Screening Mammogram showed left breast mass.  12/19/2019 Unilateral left breast diagnostic mammogram showed  41m irregular left upper outer breast mass, no left axillary lymphadenopathy.  12/27/2019 She underwent biopsy of left breast mass, pathology showed invasive mammary carcinoma, with mucinous features. Grade 2, no DCIS, no LVI, ER 90%+, PR 90%+, HER2 -  Patient was referred to establish care and discuss management plan.  Family history of breast cancer: paternal aunt x 2 breast cancer Family history of other cancers: brother lung cancer, mother possible uterine cancer  Menarche: 89Menopause: hystectomy in 89sAge at first live childbirth:23 Used OCP: about 10 years Used estrogen and progesterone therapy: 20+ years HRT History of Radiation to the chest: none Previous of breast biopsy: none   # 02/04/2020, patient has underwent left lumpectomy with sentinel lymph node biopsy. 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  DCIS, negative margin. She also had atypical lobular hyperplasia.  3 sentinel lymph nodes excision all negative. pT2 N0. Oncotype DX recurrence score 5.  No adjuvant chemotherapy was offered.  #Family  history of breast cancer, Genetic testing was done and was negative. #04/29/2020, finished adjuvant radiation therapy #05/14/2020, started on Arimidex.  INTERVAL HISTORY Tonya MANNERis a 75y.o. female who has above history reviewed by me today presents for follow up visit for management of left breast cancer, pT2N0 , ER/PR positive, HER-2 negative. Problems and complaints are listed below: Patient has started on Arimidex since March 2022.  Overall she tolerates well. She has chronic back pain which is unchanged. Occasional hot flash. She reports having balance difficulties and she was recently seen by neurology Dr. SManuella Ghazion 06/04/2020.  Patient had work-up including SPEP and immunofixation.  Labs reviewed.  No M protein was identified.  Review of Systems  Constitutional: Negative for appetite change, chills, fatigue and fever.  HENT:   Negative for hearing loss and voice change.   Eyes: Negative for eye problems.  Respiratory: Negative for chest tightness and cough.   Cardiovascular: Negative for chest pain.  Gastrointestinal: Negative for abdominal distention, abdominal pain and blood in stool.  Endocrine: Negative for hot flashes.  Genitourinary: Negative for difficulty urinating and frequency.   Musculoskeletal: Negative for arthralgias.  Skin: Negative for itching and rash.  Neurological: Negative for extremity weakness.  Hematological: Negative for adenopathy.  Psychiatric/Behavioral: Negative for confusion.    MEDICAL HISTORY:  Past Medical History:  Diagnosis Date  . Arthritis   . [place]   [place]  . Chronic back pain   . Complication of anesthesia    pt states she felt every thing during breast augmentation surgery  . Diabetes mellitus without complication ([place]   . Family history of breast cancer   . Family history of lung cancer   . Family history of uterine cancer   .  GERD (gastroesophageal reflux disease)   . Hypertension   . Prediabetes   .  Stomach ulcer     SURGICAL HISTORY: Past Surgical History:  Procedure Laterality Date  . ABDOMINAL HYSTERECTOMY    . AUGMENTATION MAMMAPLASTY Bilateral 1987  . BREAST ENHANCEMENT SURGERY    . BREAST LUMPECTOMY WITH SENTINEL LYMPH NODE BIOPSY Left 02/04/2020   Procedure: BREAST LUMPECTOMY WITH SENTINEL LYMPH NODE BX;  Surgeon: Robert Bellow, MD;  Location: [facility] ORS;  Service: General;  Laterality: Left;  . BUNIONECTOMY Bilateral    Great toes  . CATARACT EXTRACTION Left   . colonoscopy    . KYPHOPLASTY N/A 02/06/2014   Procedure:  L2 KYPHOPLASTY;  Surgeon: Melina Schools, MD;  Location: [place];  Service: Orthopedics;  Laterality: N/A;  . RADIAL KERATOTOMY    . TONSILLECTOMY    . UPPER GI ENDOSCOPY      SOCIAL HISTORY: Social History   Socioeconomic History  . Marital status: Divorced    Spouse name: Not on file  . Number of children: Not on file  . Years of education: Not on file  . Highest education level: Not on file  Occupational History  . Not on file  Tobacco Use  . Smoking status: Never Smoker  . Smokeless tobacco: Never Used  Vaping Use  . Vaping Use: Never used  Substance and Sexual Activity  . Alcohol use: Yes    Alcohol/week: 2.0 standard drinks    Types: 2 Glasses of wine per week    Comment: socially  . Drug use: No  . Sexual activity: Not Currently  Other Topics Concern  . Not on file  Social History Narrative  . Not on file   Social Determinants of Health   Financial Resource Strain: Not on file  Food Insecurity: Not on file  Transportation Needs: Not on file  Physical Activity: Not on file  Stress: Not on file  Social Connections: Not on file  Intimate Partner Violence: Not on file    FAMILY HISTORY: Family History  Problem Relation Age of Onset  . Breast cancer Paternal Aunt   . Breast cancer Paternal Aunt   . Uterine cancer Mother        probable  . Lung cancer Brother        metastatic disease; unsure if lung was pimary  .  Breast cancer Paternal Aunt   . ALS Daughter   . Breast cancer Cousin        dx 20s  . Breast cancer Cousin        dx 46s  . Breast cancer Cousin        dx 52s  . Lung cancer Cousin     ALLERGIES:  has No Known Allergies.  MEDICATIONS:  Current Outpatient Medications  Medication Sig Dispense Refill  . acetaminophen (TYLENOL) 325 MG tablet Take 650 mg by mouth every 6 (six) hours as needed for moderate pain.     Marland Kitchen anastrozole (ARIMIDEX) 1 MG tablet Take 1 tablet (1 mg total) by mouth daily. 30 tablet 2  . brimonidine-timolol (COMBIGAN) 0.2-0.5 % ophthalmic solution Place 1 drop into the right eye in the morning and at bedtime.    . calcium-vitamin D (OSCAL WITH D) 250-125 MG-UNIT tablet Take 1 tablet by mouth daily.    . cetirizine (ZYRTEC) 10 MG tablet Take 10 mg by mouth every morning.     . Cyanocobalamin (VITAMIN B 12 PO) Take by mouth.    . gabapentin (NEURONTIN) 300  MG capsule Take 1 capsule (300 mg total) by mouth 2 (two) times daily. 60 capsule 3  . lisinopril-hydrochlorothiazide (ZESTORETIC) 20-12.5 MG tablet Take 1 tablet by mouth every morning.     Marland Kitchen omeprazole (PRILOSEC) 40 MG capsule Take 1 tablet by mouth every morning.     . Semaglutide (OZEMPIC, 0.25 OR 0.5 MG/DOSE, [place]) Inject 0.25 mg into the skin once a week.     . vitamin C (ASCORBIC ACID) 500 MG tablet Take 500 mg by mouth daily.    . Vitamin D, Ergocalciferol, (DRISDOL) 1.25 MG (50000 UT) CAPS capsule Take 50,000 Units by mouth every 7 (seven) days.      No current facility-administered medications for this visit.     PHYSICAL EXAMINATION: ECOG PERFORMANCE STATUS: 0 - Asymptomatic Vitals:   07/01/20 1350  BP: 112/73  Pulse: 61  Resp: 18  Temp: 98 F (36.7 C)   Filed Weights   07/01/20 1350  Weight: 210 lb 6.4 oz (95.4 kg)    Physical Exam Constitutional:      General: She is not in acute distress. HENT:     Head: Normocephalic and atraumatic.  Eyes:     General: No scleral  icterus. Cardiovascular:     Rate and Rhythm: Normal rate and regular rhythm.     Heart sounds: Normal heart sounds.  Pulmonary:     Effort: Pulmonary effort is normal. No respiratory distress.     Breath sounds: No wheezing.  Abdominal:     General: Bowel sounds are normal. There is no distension.     Palpations: Abdomen is soft.  Musculoskeletal:        General: No deformity. Normal range of motion.     Cervical back: Normal range of motion and neck supple.  Skin:    General: Skin is warm and dry.     Findings: No erythema or rash.  Neurological:     Mental Status: She is alert and oriented to person, place, and time. Mental status is at baseline.     Cranial Nerves: No cranial nerve deficit.     Coordination: Coordination normal.  Psychiatric:        Mood and Affect: Mood normal.   Left breast examination was performed.  Focal erythema/hyperpigmentation of left breast skin.   LABORATORY DATA:  I have reviewed the data as listed Lab Results  Component Value Date   WBC 7.2 07/01/2020   HGB 13.0 07/01/2020   HCT 39.4 07/01/2020   MCV 91.8 07/01/2020   PLT 288 07/01/2020   Recent Labs    01/03/20 1505 01/11/20 1145 07/01/20 1336  NA 138 137 141  K 4.0 4.2 4.2  CL 101 101 102  CO2 _0 GLUCOSE 116* 118* 86  BUN 32* 32* 28*  CREATININE 1.58* 1.39* 1.28*  CALCIUM 9.2 9.5 9.1  GFRNONAA 34* 40* 44*  PROT 7.4  --  7.0  ALBUMIN 4.3  --  4.0  AST 26  --  18  ALT 22  --  17  ALKPHOS 71  --  75  BILITOT 0.7  --  0.6   Iron/TIBC/Ferritin/ %Sat No results found for: IRON, TIBC, FERRITIN, IRONPCTSAT    RADIOGRAPHIC STUDIES: I have personally reviewed the radiological images as listed and agreed with the findings in the report. No results found.   ASSESSMENT & PLAN:  1. Malignant neoplasm of upper-outer quadrant of left breast in female, estrogen receptor positive ([place])   2. Osteopenia, unspecified location  3. Aromatase inhibitor use   Cancer  Staging Malignant neoplasm of upper-outer quadrant of left breast in female, estrogen receptor positive ([place]) Staging form: Breast, AJCC 8th Edition - Clinical: Stage IB (cT2, cN0, cM0, G2, ER+, PR+, HER2-) - Signed by Earlie Server, MD on 01/03/2020 - Pathologic stage from 02/26/2020: Stage IA (pT2, pN0, cM0, G2, ER+, PR+, HER2-, Oncotype DX score: 5) - Signed by Earlie Server, MD on 02/26/2020  Mammogram images and pathology were reviewed and discussed with patient.  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 s/p adjuvant radiation. Patient tolerates Arimidex. Labs reviewed and discussed with patient Continue current regimen.  #Osteopenia,10/30/2019, DEXA showed osteopenia, 10-year risk of any major fracture 15%, 10-year risk of hip fracture 2.3%.  Recommend patient to continue calcium 1200 mg daily and vitamin D supplementation. Discussed about the rationale of bisphosphonate.  recommend dental clearance.  All questions were answered. The patient knows to call the clinic with any problems questions or concerns.  cc Juluis Pitch, MD    Return of visit: 4 months    Earlie Server, MD, PhD Hematology Oncology [facility] at [facility] Pager- [phone] 07/01/2020

## 2020-07-08 DIAGNOSIS — R2681 Unsteadiness on feet: Secondary | ICD-10-CM | POA: Diagnosis not present

## 2020-07-09 ENCOUNTER — Telehealth: Payer: Self-pay | Admitting: *Deleted

## 2020-07-09 NOTE — Telephone Encounter (Signed)
Called patient, request received from [facility] for a refill request on Gabapentin.  Called patient and she does have enough medications to last until her next appt on 09/04/20.  She thinks that [facility] is just wanting to get the Rx for a 90 day qty and not 30 day qty with refills.  I told her she could certainly check with Dr Holley Raring at next visit.

## 2020-07-21 DIAGNOSIS — R2689 Other abnormalities of gait and mobility: Secondary | ICD-10-CM | POA: Diagnosis not present

## 2020-07-21 DIAGNOSIS — G629 Polyneuropathy, unspecified: Secondary | ICD-10-CM | POA: Diagnosis not present

## 2020-07-22 DIAGNOSIS — M79641 Pain in right hand: Secondary | ICD-10-CM | POA: Diagnosis not present

## 2020-07-22 DIAGNOSIS — W010XXA Fall on same level from slipping, tripping and stumbling without subsequent striking against object, initial encounter: Secondary | ICD-10-CM | POA: Diagnosis not present

## 2020-07-22 DIAGNOSIS — M79642 Pain in left hand: Secondary | ICD-10-CM | POA: Diagnosis not present

## 2020-07-22 DIAGNOSIS — S60221A Contusion of right hand, initial encounter: Secondary | ICD-10-CM | POA: Diagnosis not present

## 2020-07-28 DIAGNOSIS — W010XXA Fall on same level from slipping, tripping and stumbling without subsequent striking against object, initial encounter: Secondary | ICD-10-CM | POA: Diagnosis not present

## 2020-07-28 DIAGNOSIS — S52514A Nondisplaced fracture of right radial styloid process, initial encounter for closed fracture: Secondary | ICD-10-CM | POA: Diagnosis not present

## 2020-07-28 DIAGNOSIS — M25531 Pain in right wrist: Secondary | ICD-10-CM | POA: Diagnosis not present

## 2020-08-04 ENCOUNTER — Encounter: Payer: Self-pay | Admitting: Oncology

## 2020-08-05 DIAGNOSIS — Z170 Estrogen receptor positive status [ER+]: Secondary | ICD-10-CM | POA: Diagnosis not present

## 2020-08-05 DIAGNOSIS — C50212 Malignant neoplasm of upper-inner quadrant of left female breast: Secondary | ICD-10-CM | POA: Diagnosis not present

## 2020-08-09 ENCOUNTER — Other Ambulatory Visit: Payer: Self-pay | Admitting: Oncology

## 2020-08-18 DIAGNOSIS — M25531 Pain in right wrist: Secondary | ICD-10-CM | POA: Diagnosis not present

## 2020-08-18 DIAGNOSIS — W010XXD Fall on same level from slipping, tripping and stumbling without subsequent striking against object, subsequent encounter: Secondary | ICD-10-CM | POA: Diagnosis not present

## 2020-08-18 DIAGNOSIS — S52514D Nondisplaced fracture of right radial styloid process, subsequent encounter for closed fracture with routine healing: Secondary | ICD-10-CM | POA: Diagnosis not present

## 2020-08-19 ENCOUNTER — Ambulatory Visit: Payer: Medicare HMO | Attending: Neurology

## 2020-08-19 ENCOUNTER — Other Ambulatory Visit: Payer: Self-pay

## 2020-08-19 DIAGNOSIS — R2689 Other abnormalities of gait and mobility: Secondary | ICD-10-CM | POA: Diagnosis not present

## 2020-08-19 DIAGNOSIS — R2681 Unsteadiness on feet: Secondary | ICD-10-CM

## 2020-08-19 NOTE — Patient Instructions (Signed)
   Access Code: [email or web address] URL: [email or web address] Date: 08/19/2020 Prepared by: Janna Arch  Exercises Standing Tandem Balance with Counter Support - 1 x daily - 7 x weekly - 2 sets - 2 reps - 30 hold Standing Single Leg Stance with Unilateral Counter Support - 1 x daily - 7 x weekly - 2 sets - 2 reps - 30 hold Standing March with Counter Support - 1 x daily - 7 x weekly - 2 sets - 10 reps - 5 hold Seated Ankle Circles - 1 x daily - 7 x weekly - 2 sets - 10 reps - 5 hold

## 2020-08-19 NOTE — Therapy (Signed)
[place] [facility] [address] [place], [place], [address] Phone: [phone]   Fax:  [phone]  Physical Therapy Evaluation  Patient Details  Name: Tonya Bowen MRN: [id number] Date of Birth: January 10, 1946 Referring Provider (PT): Jennings Books   Encounter Date: 08/19/2020   PT End of Session - 08/19/20 1408    Visit Number 1    Number of Visits 16    Date for PT Re-Evaluation 10/14/20    Authorization Type 1/10 eval 6/7    PT Start Time 1259    PT Stop Time 1354    PT Time Calculation (min) 55 min    Equipment Utilized During Treatment Gait belt    Activity Tolerance Patient tolerated treatment well    Behavior During Therapy WFL for tasks assessed/performed           Past Medical History:  Diagnosis Date  . Arthritis   . Cancer ([place]) 12/27/2019   left breast  . Chronic back pain   . Complication of anesthesia    pt states she felt every thing during breast augmentation surgery  . Diabetes mellitus without complication ([place])   . Family history of breast cancer   . Family history of lung cancer   . Family history of uterine cancer   . GERD (gastroesophageal reflux disease)   . Hypertension   . Prediabetes   . Stomach ulcer     Past Surgical History:  Procedure Laterality Date  . ABDOMINAL HYSTERECTOMY    . AUGMENTATION MAMMAPLASTY Bilateral 1987  . BREAST ENHANCEMENT SURGERY    . BREAST LUMPECTOMY WITH SENTINEL LYMPH NODE BIOPSY Left 02/04/2020   Procedure: BREAST LUMPECTOMY WITH SENTINEL LYMPH NODE BX;  Surgeon: Robert Bellow, MD;  Location: [facility];  Service: General;  Laterality: Left;  . BUNIONECTOMY Bilateral    Great toes  . CATARACT EXTRACTION Left   . colonoscopy    . KYPHOPLASTY N/A 02/06/2014   Procedure:  L2 KYPHOPLASTY;  Surgeon: Melina Schools, MD;  Location: [facility];  Service: Orthopedics;  Laterality: N/A;  . RADIAL KERATOTOMY    . TONSILLECTOMY    . UPPER GI ENDOSCOPY      There were  no vitals filed for this visit.    Subjective Assessment - 08/19/20 1311    Subjective Patient is a 75 year old female who presents for imbalance.    Pertinent History Patient is a 75 year old female who presents for imbalance. Per physician note symptoms because October 2021 and primarily veers to the left. She has ringing in her ears and hearing loss in both ears. 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 She also takes care of sometimes up to 7 dogs at a time (she has 3 of her own).    Limitations Sitting;Standing;Walking;Lifting;House hold activities    How long can you sit comfortably? 3-4 hours    How long can you stand comfortably? 5 minutes    How long can you walk comfortably? not walking a lot due to the balance    Patient Stated Goals to improve balance.    Currently in Pain? Yes    Pain Score 3     Pain Location Back    Pain Orientation Lower    Pain Descriptors /  Indicators Aching    Pain Type Chronic pain    Pain Onset More than a month ago    Pain Frequency Intermittent    Aggravating Factors  walking, lifting, moving    Pain Relieving Factors seated            [facility] PT Assessment - 08/19/20 0001      Assessment   Medical Diagnosis Imbalance    Referring Provider (PT) Manuella Ghazi, [place]    Onset Date/Surgical Date --   ~ 6 months ago   Hand Dominance Right      Precautions   Precautions Fall    Required Braces or Orthoses --   R wrist     Restrictions   Weight Bearing Restrictions No      Balance Screen   Has the patient fallen in the past 6 months Yes    How many times? 1 major fall breaking R wrist, multiple near misses    Has the patient had a decrease in activity level because of a fear of  falling?  Yes    Is the patient reluctant to leave their home because of a fear of falling?  Yes      Home Environment   Living Environment Private residence    Living Arrangements --   caregiver for adult daughter with ALS   Type of [place] to enter    Entrance Stairs-Number of Steps 3    [place] One level      Prior Function   Level of Independence Independent    Vocation Requirements walks/dog sitter      Observation/Other Assessments   Focus on Therapeutic Outcomes (FOTO)  46%      Standardized Balance Assessment   Standardized Balance Assessment Dynamic Gait Index;Berg Balance Test      Berg Balance Test   Sit to Stand Able to stand without using hands and stabilize independently    Standing Unsupported Able to stand safely 2 minutes    Sitting with Back Unsupported but Feet Supported on Floor or Stool Able to sit safely and securely 2 minutes    Stand to Sit Sits safely with minimal use of hands    Transfers Able to transfer safely, minor use of hands    Standing Unsupported with Eyes Closed Able to stand 10 seconds with supervision    Standing Unsupported with Feet Together Able to place feet together independently and stand for 1 minute with supervision    From Standing, Reach Forward with Outstretched Arm Can reach forward >12 cm safely (5")    From Standing Position, Pick up Object from Floor Able to pick up shoe, needs supervision    From Standing Position, Turn to Look Behind Over each Shoulder Looks behind from both sides and weight shifts well    Turn 360 Degrees Able to turn 360 degrees safely in 4 seconds or less    Standing Unsupported, Alternately Place Feet on Step/Stool Able to stand independently and safely and complete 8 steps in 20 seconds    Standing Unsupported, One Foot in Front Able to take small step independently and hold 30 seconds    Standing on One Leg Able to lift leg independently and hold 5-10 seconds    Total Score  49      Dynamic Gait Index   Level Surface Normal    Change in Gait Speed Mild Impairment    Gait with Horizontal Head Turns Mild Impairment  Gait with Vertical Head Turns Mild Impairment    Gait and Pivot Turn Mild Impairment    Step Over Obstacle Normal    Step Around Obstacles Mild Impairment    Steps Mild Impairment    Total Score 18              PAIN: Has back pain all the time:  -worst: 7/10 -best: 2-3/10  R wrist: was broken during a fall R eye has a swollen retina with eyelid drooping.      STRENGTH:  Graded on a 0-5 scale Muscle Group Left Right  Hip Flex 4/5 4/5  Hip Abd 4/5 4/5  Hip Add 3+/5 3+/5  Hip Ext 2+/5 2+/5  Hip IR/ER /5 /5  Knee Flex 2+/5 2+/5  Knee Ext 3+/5 3+/5  Ankle DF 4-/5 /4-5  Ankle PF 4-/5 4-/5   SENSATION:  BUE :  BLE :   NEUROLOGICAL SCREEN: (2+ unless otherwise noted.) N=normal  Ab=abnormal   Level Dermatome R L  C3 Anterior Neck  N N  C4 Top of Shoulder N N  C5 Lateral Upper Arm  N N  C6 Lateral Arm/ Thumb  N N  C7 Middle Finger  N N  C8 4th & 5th Finger N N  T1 Medial Arm N N  L2 Medial thigh/groin N N  L3 Lower thigh/med.knee N N  L4 Medial leg/lat thigh N N  L5 Lat. leg & dorsal foot N N  S1 post/lat foot/thigh/leg N N  S2 Post./med. thigh & leg N N    SOMATOSENSORY:  Any N & T in extremities or weakness: reports :         Sensation           Intact      Diminished         Absent  Light touch LEs                              COORDINATION: Finger to Nose: Dysmetric RUE with past pointing and slow speed          Heel Shin Slide Test: past pointing slow speed    SPECIAL TESTS: Cranial Nerves/Vision  H test: visual scan [facility] until looking to the R; slight R beat with extreme R visual scan  FUNCTIONAL MOBILITY: STS: able to perform without UE support   BALANCE: Static Sitting Balance  Normal Able to maintain balance against maximal resistance   Good Able to maintain balance against moderate  resistance   Good-/Fair+ Accepts minimal resistance x  Fair Able to sit unsupported without balance loss and without UE support   Poor+ Able to maintain with Minimal assistance from individual or chair   Poor Unable to maintain balance-requires mod/max support from individual or chair    Static Standing Balance  Normal Able to maintain standing balance against maximal resistance   Good Able to maintain standing balance against moderate resistance   Good-/Fair+ Able to maintain standing balance against minimal resistance x  Fair Able to stand unsupported without UE support and without LOB for 1-2 min   Fair- Requires Min A and UE support to maintain standing without loss of balance   Poor+ Requires mod A and UE support to maintain standing without loss of balance   Poor Requires max A and UE support to maintain standing balance without loss    Dynamic Sitting Balance  Normal Able to sit unsupported and weight  shift across midline maximally   Good Able to sit unsupported and weight shift across midline moderately   Good-/Fair+ Able to sit unsupported and weight shift across midline minimally   Fair Minimal weight shifting ipsilateral/front, difficulty crossing midline x  Fair- Reach to ipsilateral side and unable to weight shift   Poor + Able to sit unsupported with min A and reach to ipsilateral side, unable to weight shift   Poor Able to sit unsupported with mod A and reach ipsilateral/front-can't cross midline    Standing Dynamic Balance  Normal Stand independently unsupported, able to weight shift and cross midline maximally   Good Stand independently unsupported, able to weight shift and cross midline moderately   Good-/Fair+ Stand independently unsupported, able to weight shift across midline minimally x  Fair Stand independently unsupported, weight shift, and reach ipsilaterally, loss of balance when crossing midline   Poor+ Able to stand with Min A and reach ipsilaterally, unable  to weight shift   Poor Able to stand with Mod A and minimally reach ipsilaterally, unable to cross midline.      GAIT: Ambulates without an AD with slight foot drag bilaterally   OUTCOME MEASURES: TEST Outcome Interpretation  5 times sit<>stand 13 sec >60 yo, >15 sec indicates increased risk for falls  DGI 18/24   Berg Balance Assessment 49/56 <36/56 (100% risk for falls), 37-45 (80% risk for falls); 46-51 (>50% risk for falls); 52-55 (lower risk <25% of falls)  FOTO 46% Predicted discharge score of 53%        Access Code: [email or web address] URL: [email or web address] Date: 08/19/2020 Prepared by: Janna Arch  Exercises Standing Tandem Balance with Counter Support - 1 x daily - 7 x weekly - 2 sets - 2 reps - 30 hold Standing Single Leg Stance with Unilateral Counter Support - 1 x daily - 7 x weekly - 2 sets - 2 reps - 30 hold Standing March with Counter Support - 1 x daily - 7 x weekly - 2 sets - 10 reps - 5 hold Seated Ankle Circles - 1 x daily - 7 x weekly - 2 sets - 10 reps - 5 hold     Objective measurements completed on examination: See above findings.     Patient is a pleasant 75 year old female who presents to physical therapy for imbalance. 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 Patient has bilateral ankle weakness as well as limited ability to self stabilize within COM as can be seen with tandem and single limb stability. Ambulation capacity is limited by back pain however is able to perform short duration with shuffle steppage. HEP given with patient demonstrating understanding. Patient will benefit from skilled physical therapy to reduce fall risk, improve functional mobility, and improve quality of life.           PT Education - 08/19/20 1336    Education Details POC, goals, HEP    Person(s) Educated Patient    Methods Explanation;Demonstration;Tactile  cues;Verbal cues;Handout    Comprehension Verbalized understanding;Returned demonstration;Verbal cues required;Tactile cues required            PT Short Term Goals - 08/19/20 1411      PT SHORT TERM GOAL #1   Title Patient will be independent in home exercise program to improve strength/mobility for better functional independence with ADLs.    Baseline 6/7: HEP given    Time  4    Period Weeks    Status New    Target Date 09/16/20             PT Long Term Goals - 08/19/20 1411      PT LONG TERM GOAL #1   Title Patient will increase FOTO score to equal to or greater than  53%   to demonstrate statistically significant improvement in mobility and quality of life.    Baseline 6/7: 46%    Time 8    Period Weeks    Status New    Target Date 10/14/20      PT LONG TERM GOAL #2   Title Patient will increase Berg Balance score by > 6 points (55/56 ) to demonstrate decreased fall risk during functional activities.    Baseline 6/7: 49/56    Time 8    Period Weeks    Status New    Target Date 10/14/20      PT LONG TERM GOAL #3   Title Patient will increase dynamic gait index score to >20/24 as to demonstrate reduced fall risk and improved dynamic gait balance for better safety with community/home ambulation.    Baseline 6/7: 18/24    Time 8    Period Weeks    Status New    Target Date 10/14/20      PT LONG TERM GOAL #4   Title Patient will deny any falls over past 4 weeks to demonstrate improved safety awareness at home and work.    Baseline 6/7: one major fall breaking wrist    Time 8    Period Weeks    Status New    Target Date 10/14/20                  Plan - 08/19/20 1408    Clinical Impression Statement Patient is a pleasant 75 year old female who presents to physical therapy for imbalance. 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 Patient has bilateral ankle weakness as  well as limited ability to self stabilize within COM as can be seen with tandem and single limb stability. Ambulation capacity is limited by back pain however is able to perform short duration with shuffle steppage. HEP given with patient demonstrating understanding. Patient will benefit from skilled physical therapy to reduce fall risk, improve functional mobility, and improve quality of life    Personal Factors and Comorbidities Age;Comorbidity 3+;Finances;Social Background;Other    Comorbidities rthritis, cataract cortical senile, DM without complication, gastroesophageal reflux disease, hearing loss, hemorrhoids, cancer, gastric ulcer, HLD, HTN, osteoporosis, tinittus    Examination-Activity Limitations Caring for [employer];Locomotion Level;Squat;Stairs;Stand;Toileting;Transfers    Examination-Participation Restrictions Cleaning;Community Activity;Occupation;Meal Prep;Laundry;Shop;Volunteer;Yard Work;Other   work with Engineer, manufacturing Evolving/Moderate complexity    Clinical Decision Making Moderate    Rehab Potential Fair    PT Frequency 2x / week    PT Duration 8 weeks    PT Treatment/Interventions ADLs/Self Care Home Management;Aquatic Therapy;Biofeedback;Canalith Repostioning;Cryotherapy;Electrical Stimulation;Iontophoresis 4mg /ml Dexamethasone;Moist Heat;Traction;Ultrasound;DME Instruction;Gait training;Stair training;Functional mobility training;Cognitive remediation;Neuromuscular re-education;Balance training;Therapeutic exercise;Therapeutic activities;Patient/family education;Orthotic Fit/Training;Manual techniques;Dry needling;Passive range of motion;Energy conservation;Splinting;Taping;Vasopneumatic Device;Vestibular;Visual/perceptual remediation/compensation    PT Next Visit Plan airex pad, high level balance    PT Home Exercise Plan see above    Consulted and Agree with Plan of Care Patient           Patient will benefit from  skilled therapeutic intervention in order to improve the following deficits and impairments:  Abnormal gait,Decreased activity tolerance,Decreased balance,Decreased endurance,Decreased coordination,Decreased mobility,Difficulty walking,Decreased strength,Impaired flexibility,Impaired perceived functional ability,Impaired vision/preception,Postural dysfunction,Improper body mechanics,Pain  Visit Diagnosis: Unsteadiness on feet  Other abnormalities of gait and mobility     Problem List Patient Active Problem List   Diagnosis Date Noted  . Genetic testing 01/22/2020  . Family history of uterine cancer   . Family history of lung cancer   . Malignant neoplasm of upper-outer quadrant of left breast in female, estrogen receptor positive ([place]) 01/03/2020  . Goals of care, counseling/discussion 01/03/2020  . Family history of breast cancer 01/03/2020  . Chronic midline thoracic back pain 01/02/2020  . Lumbar degenerative disc disease 01/02/2020  . Lumbar facet arthropathy 01/02/2020  . Chronic pain syndrome 01/02/2020  . Hyperglycemia 12/09/2017  . Osteopenia 10/04/2014  . S/P kyphoplasty 02/06/2014  . Benign essential hypertension 09/03/2013   Janna Arch, PT, DPT   08/19/2020, 2:14 PM  [place] [facility] [address] [place], [place], [address] Phone: [phone]   Fax:  [phone]  Name: Tonya Bowen MRN: [id number] Date of Birth: April 11, 1945

## 2020-08-26 DIAGNOSIS — H35351 Cystoid macular degeneration, right eye: Secondary | ICD-10-CM | POA: Diagnosis not present

## 2020-08-27 ENCOUNTER — Other Ambulatory Visit: Payer: Self-pay | Admitting: Oncology

## 2020-08-27 ENCOUNTER — Ambulatory Visit: Payer: Medicare HMO

## 2020-08-27 DIAGNOSIS — G8929 Other chronic pain: Secondary | ICD-10-CM | POA: Diagnosis not present

## 2020-08-27 DIAGNOSIS — E119 Type 2 diabetes mellitus without complications: Secondary | ICD-10-CM | POA: Diagnosis not present

## 2020-08-27 DIAGNOSIS — Z1331 Encounter for screening for depression: Secondary | ICD-10-CM | POA: Diagnosis not present

## 2020-08-27 DIAGNOSIS — I10 Essential (primary) hypertension: Secondary | ICD-10-CM | POA: Diagnosis not present

## 2020-08-27 DIAGNOSIS — C50912 Malignant neoplasm of unspecified site of left female breast: Secondary | ICD-10-CM | POA: Diagnosis not present

## 2020-08-27 DIAGNOSIS — M545 Low back pain, unspecified: Secondary | ICD-10-CM | POA: Diagnosis not present

## 2020-08-27 DIAGNOSIS — E785 Hyperlipidemia, unspecified: Secondary | ICD-10-CM | POA: Diagnosis not present

## 2020-08-27 DIAGNOSIS — M8580 Other specified disorders of bone density and structure, unspecified site: Secondary | ICD-10-CM | POA: Diagnosis not present

## 2020-08-27 DIAGNOSIS — Z0000 Encounter for general adult medical examination without abnormal findings: Secondary | ICD-10-CM | POA: Diagnosis not present

## 2020-08-28 ENCOUNTER — Other Ambulatory Visit: Payer: Self-pay

## 2020-08-28 ENCOUNTER — Ambulatory Visit: Payer: Medicare HMO | Admitting: Physical Therapy

## 2020-08-28 ENCOUNTER — Encounter: Payer: Self-pay | Admitting: Physical Therapy

## 2020-08-28 DIAGNOSIS — R2681 Unsteadiness on feet: Secondary | ICD-10-CM | POA: Diagnosis not present

## 2020-08-28 DIAGNOSIS — R2689 Other abnormalities of gait and mobility: Secondary | ICD-10-CM

## 2020-08-28 NOTE — Therapy (Signed)
[place] [facility] [address] [place], [place], [address] Phone: [phone]   Fax:  [phone]  Physical Therapy Treatment  Patient Details  Name: Tonya Bowen MRN: [id number] Date of Birth: [birth year] Referring Provider (PT): Jennings Books   Encounter Date: 08/28/2020   PT End of Session - 08/28/20 1438     Visit Number 2    Number of Visits 16    Date for PT Re-Evaluation 10/14/20    Authorization Type 1/10 eval 6/7    PT Start Time 1432    PT Stop Time 1515    PT Time Calculation (min) 43 min    Equipment Utilized During Treatment Gait belt    Activity Tolerance Patient tolerated treatment well    Behavior During Therapy WFL for tasks assessed/performed             Past Medical History:  Diagnosis Date   Arthritis    Cancer ([place]) 12/27/2019   left breast   Chronic back pain    Complication of anesthesia    pt states she felt every thing during breast augmentation surgery   Diabetes mellitus without complication ([place])    Family history of breast cancer    Family history of lung cancer    Family history of uterine cancer    GERD (gastroesophageal reflux disease)    Hypertension    Prediabetes    Stomach ulcer     Past Surgical History:  Procedure Laterality Date   ABDOMINAL HYSTERECTOMY     AUGMENTATION MAMMAPLASTY Bilateral 1987   BREAST ENHANCEMENT SURGERY     BREAST LUMPECTOMY WITH SENTINEL LYMPH NODE BIOPSY Left 02/04/2020   Procedure: BREAST LUMPECTOMY WITH SENTINEL LYMPH NODE BX;  Surgeon: Robert Bellow, MD;  Location: [facility];  Service: General;  Laterality: Left;   BUNIONECTOMY Bilateral    Great toes   CATARACT EXTRACTION Left    colonoscopy     KYPHOPLASTY N/A 02/06/2014   Procedure:  L2 KYPHOPLASTY;  Surgeon: Melina Schools, MD;  Location: [place];  Service: Orthopedics;  Laterality: N/A;   RADIAL KERATOTOMY     TONSILLECTOMY     UPPER GI ENDOSCOPY      There were no vitals filed  for this visit.   Subjective Assessment - 08/28/20 1436     Subjective Patient reports doing well; She reports some soreness in RUE today. She presents to therapy with wrist splint. Denies any new falls;    Pertinent History Patient is a 75 year old female who presents for imbalance. Per physician note symptoms because October 2021 and primarily veers to the left. She has ringing in her ears and hearing loss in both ears. 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 She also takes care of sometimes up to 7 dogs at a time (she has 3 of her own).    Limitations Sitting;Standing;Walking;Lifting;House hold activities    How long can you sit comfortably? 3-4 hours    How long can you stand comfortably? 5 minutes    How long can you walk comfortably? not walking a lot due to the balance    Patient Stated Goals to improve balance.    Currently in Pain? Yes    Pain Score 2  Pain Location Arm    Pain Orientation Right    Pain Descriptors / Indicators Aching;Sore    Pain Type Chronic pain    Pain Onset More than a month ago    Pain Frequency Intermittent    Aggravating Factors  worse with raising arm overhead or reaching backwards    Pain Relieving Factors rest    Effect of Pain on Daily Activities decreased activity tolerance;                 TREATMENT: Patient reports she hasn't been doing HEP because she lost her paper  Re-printed HEP and reinforced:  Tandem stance on firm surface 30 sec hold x2 rep each LE with fingertip hold SLS on firm surface 30 sec hold x2 reps each foot with fingertip hold March in place x10 reps with intermittent rail assist, min VCS for increased ROM for better strengthening  Seated  ankle circles clockwise/counterclockwise x10 reps each LE  Instructed patient in advanced balance tasks:  Standing on airex pad: -feet apart:  Heel/toe raises x15 reps with intermittent rail assist -feet together:   Eyes open 30 sec hold, eyes closed 15 sec hold x2 sets each -modified tandem stance:  Unsupported standing 15 sec hold  Progressed to head turns side/side x5 reps, up/down x5 reps each foot in front -standing one foot on airex, one foot on 6 inch step:  Unsupported standing: 30 sec hold   UE ball pass side/side x5 reps each foot on step;   -alternate toe taps airex to 6 inch step without rail assist x15 reps each LE  Standing on 1/2 bolster: -feet apart heel/toe raises x15 reps -feet apart balance in neutral unsupported x15 sec x2 sets;   Patient does exhibit slight tendency for left lateral loss of balance. However she is able to self correct with intermittent rail assist and verbal cues; Does require min A for advanced balance tasks with less rail assist;                       PT Education - 08/28/20 1437     Education Details balance/HEP    Person(s) Educated Patient    Methods Explanation;Verbal cues    Comprehension Verbalized understanding;Returned demonstration;Verbal cues required;Need further instruction              PT Short Term Goals - 08/19/20 1411       PT SHORT TERM GOAL #1   Title Patient will be independent in home exercise program to improve strength/mobility for better functional independence with ADLs.    Baseline 6/7: HEP given    Time 4    Period Weeks    Status New    Target Date 09/16/20               PT Long Term Goals - 08/19/20 1411       PT LONG TERM GOAL #1   Title Patient will increase FOTO score to equal to or greater than  53%   to demonstrate statistically significant improvement in mobility and quality of life.    Baseline 6/7: 46%    Time 8    Period Weeks    Status New    Target Date  10/14/20      PT LONG TERM GOAL #2   Title Patient will increase Berg Balance score by > 6 points (55/56 ) to demonstrate decreased fall risk during functional activities.    Baseline 6/7: 49/56  Time 8    Period Weeks    Status New    Target Date 10/14/20      PT LONG TERM GOAL #3   Title Patient will increase dynamic gait index score to >20/24 as to demonstrate reduced fall risk and improved dynamic gait balance for better safety with community/home ambulation.    Baseline 6/7: 18/24    Time 8    Period Weeks    Status New    Target Date 10/14/20      PT LONG TERM GOAL #4   Title Patient will deny any falls over past 4 weeks to demonstrate improved safety awareness at home and work.    Baseline 6/7: one major fall breaking wrist    Time 8    Period Weeks    Status New    Target Date 10/14/20                   Plan - 08/28/20 1521     Clinical Impression Statement Patient motivated and participated well within session. She was re-educated in HEP as she lost her paper. Patient does require min Vcs for proper positioning and exercise technique. Instructed patient in advanced balance tasks. She does have a tendency to shift towards left side but is able to self correct with verbal and visual cues. Patient does require min A with advanced balance tasks especially with less rail assist on compliant surfaces. She would benefit from additional skilled PT Intervention to improve strength, balance and mobility;    Personal Factors and Comorbidities Age;Comorbidity 3+;Finances;Social Background;Other    Comorbidities rthritis, cataract cortical senile, DM without complication, gastroesophageal reflux disease, hearing loss, hemorrhoids, cancer, gastric ulcer, HLD, HTN, osteoporosis, tinittus    Examination-Activity Limitations Caring for [employer];Locomotion Level;Squat;Stairs;Stand;Toileting;Transfers    Examination-Participation Restrictions  Cleaning;Community Activity;Occupation;Meal Prep;Laundry;Shop;Volunteer;Yard Work;Other   work with Engineer, manufacturing Evolving/Moderate complexity    Rehab Potential Fair    PT Frequency 2x / week    PT Duration 8 weeks    PT Treatment/Interventions ADLs/Self Care Home Management;Aquatic Therapy;Biofeedback;Canalith Repostioning;Cryotherapy;Electrical Stimulation;Iontophoresis 4mg /ml Dexamethasone;Moist Heat;Traction;Ultrasound;DME Instruction;Gait training;Stair training;Functional mobility training;Cognitive remediation;Neuromuscular re-education;Balance training;Therapeutic exercise;Therapeutic activities;Patient/family education;Orthotic Fit/Training;Manual techniques;Dry needling;Passive range of motion;Energy conservation;Splinting;Taping;Vasopneumatic Device;Vestibular;Visual/perceptual remediation/compensation    PT Next Visit Plan airex pad, high level balance    PT Home Exercise Plan see above    Consulted and Agree with Plan of Care Patient             Patient will benefit from skilled therapeutic intervention in order to improve the following deficits and impairments:  Abnormal gait, Decreased activity tolerance, Decreased balance, Decreased endurance, Decreased coordination, Decreased mobility, Difficulty walking, Decreased strength, Impaired flexibility, Impaired perceived functional ability, Impaired vision/preception, Postural dysfunction, Improper body mechanics, Pain  Visit Diagnosis: Unsteadiness on feet  Other abnormalities of gait and mobility     Problem List Patient Active Problem List   Diagnosis Date Noted   Genetic testing 01/22/2020   Family history of uterine cancer    Family history of lung cancer    Malignant neoplasm of upper-outer quadrant of left breast in female, estrogen receptor positive ([place]) 01/03/2020   Goals of care, counseling/discussion 01/03/2020   Family history of breast cancer 01/03/2020   Chronic midline  thoracic back pain 01/02/2020   Lumbar degenerative disc disease 01/02/2020   Lumbar facet arthropathy 01/02/2020   Chronic pain syndrome 01/02/2020   Hyperglycemia 12/09/2017   Osteopenia 10/04/2014   S/P kyphoplasty 02/06/2014   Benign  essential hypertension 09/03/2013    Keyunna Coco PT, DPT 08/28/2020, 3:23 PM  [place] [facility] SERVICES [address] [place], [place], [address] Phone: [phone]   Fax:  [phone]  Name: Tonya Bowen MRN: [id number] Date of Birth: 09-25-1945

## 2020-09-02 ENCOUNTER — Ambulatory Visit: Payer: Medicare HMO | Admitting: Physical Therapy

## 2020-09-04 ENCOUNTER — Encounter: Payer: Self-pay | Admitting: Student in an Organized Health Care Education/Training Program

## 2020-09-04 ENCOUNTER — Ambulatory Visit: Payer: Medicare HMO

## 2020-09-04 ENCOUNTER — Other Ambulatory Visit: Payer: Self-pay

## 2020-09-04 ENCOUNTER — Ambulatory Visit
Payer: Medicare HMO | Attending: Student in an Organized Health Care Education/Training Program | Admitting: Student in an Organized Health Care Education/Training Program

## 2020-09-04 VITALS — BP 120/60 | HR 68 | Temp 97.3°F | Resp 16 | Ht 64.0 in | Wt 210.0 lb

## 2020-09-04 DIAGNOSIS — M47816 Spondylosis without myelopathy or radiculopathy, lumbar region: Secondary | ICD-10-CM | POA: Diagnosis not present

## 2020-09-04 DIAGNOSIS — G894 Chronic pain syndrome: Secondary | ICD-10-CM | POA: Diagnosis not present

## 2020-09-04 DIAGNOSIS — Z9889 Other specified postprocedural states: Secondary | ICD-10-CM

## 2020-09-04 DIAGNOSIS — M5134 Other intervertebral disc degeneration, thoracic region: Secondary | ICD-10-CM | POA: Diagnosis not present

## 2020-09-04 DIAGNOSIS — M51369 Other intervertebral disc degeneration, lumbar region without mention of lumbar back pain or lower extremity pain: Secondary | ICD-10-CM

## 2020-09-04 DIAGNOSIS — M5136 Other intervertebral disc degeneration, lumbar region: Secondary | ICD-10-CM

## 2020-09-04 MED ORDER — GABAPENTIN 300 MG PO CAPS: 300.0000 mg | ORAL_CAPSULE | Freq: Two times a day (BID) | ORAL | 5 refills | Status: DC

## 2020-09-04 NOTE — Progress Notes (Signed)
Safety precautions to be maintained throughout the outpatient stay will include: orient to surroundings, keep bed in low position, maintain call bell within reach at all times, provide assistance with transfer out of bed and ambulation.  

## 2020-09-04 NOTE — Progress Notes (Signed)
PROVIDER NOTE: Information contained herein reflects review and annotations entered in association with encounter. Interpretation of such information and data should be left to medically-trained personnel. Information provided to patient can be located elsewhere in the medical record under "Patient Instructions". Document created using STT-dictation technology, any transcriptional errors that may result from process are unintentional.    Patient: Tonya Bowen  Service Category: E/M  Provider: Gillis Santa, MD  DOB: 11/17/1945  DOS: 09/04/2020  Specialty: Interventional Pain Management  MRN: [id number]  Setting: Ambulatory outpatient  PCP: Juluis Pitch, MD  Type: Established Patient    Referring Provider: Juluis Pitch, MD  Location: Office  Delivery: Face-to-face     HPI  [name], a 75 y.o. year old female, is here today because of her S/P kyphoplasty [Z98.890]. Tonya Bowen primary complain today is Back Pain ([place] ) Last encounter: My last encounter with her was on 05/12/20 Pertinent problems: Tonya Bowen has S/P kyphoplasty; Chronic midline thoracic back pain; Lumbar degenerative disc disease; Lumbar facet arthropathy; Chronic pain syndrome; and Malignant neoplasm of upper-outer quadrant of left breast in female, estrogen receptor positive ([place]) on their pertinent problem list. Pain Assessment: Severity of Chronic pain is reported as a 4 /10. Location: Back Lower/radiates up back. Onset: More than a month ago. Quality: Aching, Sore. Timing: Intermittent. Modifying factor(s): sitting lying down. Vitals:  height is '5\' 4"'  (1.626 m) and weight is 210 lb (95.3 kg). Her temperature is 97.3 F (36.3 C) (abnormal). Her blood pressure is 120/60 and her pulse is 68. Her respiration is 16 and oxygen saturation is 98%.   Reason for encounter: medication management.    Patient follows up today for management of gabapentin.  She did have a fall in her driveway secondary to imbalance. No fractures  or LOC. Still on Gabapentin, no side effects, refill as below  ROS  Constitutional: Denies any fever or chills Gastrointestinal: No reported hemesis, hematochezia, vomiting, or acute GI distress Musculoskeletal:  Positive low back pain Neurological: No reported episodes of acute onset apraxia, aphasia, dysarthria, agnosia, amnesia, paralysis, loss of coordination, or loss of consciousness  Medication Review  Cyanocobalamin, Semaglutide, Vitamin D (Ergocalciferol), acetaminophen, anastrozole, brimonidine-timolol, calcium-vitamin D, cetirizine, gabapentin, lisinopril-hydrochlorothiazide, omeprazole, and vitamin C  History Review  Allergy: Tonya Bowen has No Known Allergies. Drug: Tonya Bowen  reports no history of drug use. Alcohol:  reports current alcohol use of about 2.0 standard drinks of alcohol per week. Tobacco:  reports that she has never smoked. She has never used smokeless tobacco. Social: Tonya Bowen  reports that she has never smoked. She has never used smokeless tobacco. She reports current alcohol use of about 2.0 standard drinks of alcohol per week. She reports that she does not use drugs. Medical:  has a past medical history of Arthritis, Cancer ([place]) (12/27/2019), Chronic back pain, Complication of anesthesia, Diabetes mellitus without complication ([place]), Family history of breast cancer, Family history of lung cancer, Family history of uterine cancer, GERD (gastroesophageal reflux disease), Hypertension, Prediabetes, and Stomach ulcer. Surgical: Tonya Bowen  has a past surgical history that includes Abdominal hysterectomy; Tonsillectomy; Bunionectomy (Bilateral); Breast enhancement surgery; Cataract extraction (Left); Radial keratotomy; colonoscopy; Upper gi endoscopy; Kyphoplasty (N/A, 02/06/2014); Augmentation mammaplasty (Bilateral, 1987); and Breast lumpectomy with sentinel lymph node bx (Left, 02/04/2020). Family: family history includes ALS in her daughter; Breast cancer in her  cousin, cousin, cousin, paternal aunt, paternal aunt, and paternal aunt; Lung cancer in her brother and cousin; Uterine cancer in her  mother.  Laboratory Chemistry Profile   Renal Lab Results  Component Value Date   BUN 28 (H) 07/01/2020   CREATININE 1.28 (H) 07/01/2020   GFRAA >90 01/30/2014   GFRNONAA 44 (L) 07/01/2020     Hepatic Lab Results  Component Value Date   AST 18 07/01/2020   ALT 17 07/01/2020   ALBUMIN 4.0 07/01/2020   ALKPHOS 75 07/01/2020     Electrolytes Lab Results  Component Value Date   NA 141 07/01/2020   K 4.2 07/01/2020   CL 102 07/01/2020   CALCIUM 9.1 07/01/2020     Bone No results found for: VD25OH, VD125OH2TOT, NW2956OZ3, YQ6578IO9, 25OHVITD1, 25OHVITD2, 25OHVITD3, TESTOFREE, TESTOSTERONE   Inflammation (CRP: Acute Phase) (ESR: Chronic Phase) No results found for: CRP, ESRSEDRATE, LATICACIDVEN     Note: Above Lab results reviewed.  Recent Imaging Review  MM Breast Surgical Specimen CLINICAL DATA:  Post lumpectomy specimen radiograph. 75 year old female with recently diagnosed invasive mammary carcinoma.  EXAM: SPECIMEN RADIOGRAPH OF THE LEFT BREAST  COMPARISON:  Previous exam(s).  FINDINGS: Status post excision of the left breast. The Q biopsy marker clip is present within the specimen.  IMPRESSION: Specimen radiograph of the left breast.  This was confirmed with Dr. Bary Castilla at 1:04 p.m. on 02/04/2020.  Electronically Signed   By: Audie Pinto M.D.   On: 02/04/2020 13:05 NM Sentinel Node Inj-No Rpt (Breast) Sulfur colloid was injected by the nuclear medicine technologist for  melanoma sentinel node.  Note: Reviewed        Physical Exam  General appearance: Well nourished, well developed, and well hydrated. In no apparent acute distress Mental status: Alert, oriented x 3 (person, place, & time)       Respiratory: No evidence of acute respiratory distress Eyes: PERLA Vitals: BP 120/60   Pulse 68   Temp (!) 97.3 F  (36.3 C)   Resp 16   Ht '5\' 4"'  (1.626 m)   Wt 210 lb (95.3 kg)   SpO2 98%   BMI 36.05 kg/m  BMI: Estimated body mass index is 36.05 kg/m as calculated from the following:   Height as of this encounter: '5\' 4"'  (1.626 m).   Weight as of this encounter: 210 lb (95.3 kg). Ideal: Ideal body weight: 54.7 kg (120 lb 9.5 oz) Adjusted ideal body weight: 70.9 kg (156 lb 5.7 oz)    Lumbar Exam  Skin & Axial Inspection: No masses, redness, or swelling Alignment: Symmetrical Functional ROM: Pain restricted ROM       Stability: No instability detected Muscle Tone/Strength: Functionally intact. No obvious neuro-muscular anomalies detected. Sensory (Neurological): Musculoskeletal pain pattern Palpation: Complains of area being tender to palpation           Gait & Posture Assessment  Ambulation: Patient came in today in a wheel chair Gait: Limited. Using assistive device to ambulate Posture: Difficulty standing up straight, due to pain    Lower Extremity Exam      Side: Right lower extremity   Side: Left lower extremity  Stability: No instability observed           Stability: No instability observed          Skin & Extremity Inspection: Skin color, temperature, and hair growth are WNL. No peripheral edema or cyanosis. No masses, redness, swelling, asymmetry, or associated skin lesions. No contractures.   Skin & Extremity Inspection: Skin color, temperature, and hair growth are WNL. No peripheral edema or cyanosis. No masses, redness, swelling, asymmetry, or associated  skin lesions. No contractures.  Functional ROM: Unrestricted ROM                   Functional ROM: Unrestricted ROM                  Muscle Tone/Strength: Functionally intact. No obvious neuro-muscular anomalies detected.   Muscle Tone/Strength: Functionally intact. No obvious neuro-muscular anomalies detected.  Sensory (Neurological): Unimpaired         Sensory (Neurological): Unimpaired        DTR: Patellar: deferred  today Achilles: deferred today Plantar: deferred today   DTR: Patellar: deferred today Achilles: deferred today Plantar: deferred today  Palpation: No palpable anomalies   Palpation: No palpable anomalies      Assessment   Status Diagnosis  Controlled Controlled Controlled 1. S/P kyphoplasty (L2 done in 2015 with Dr Rolena Infante)   2. Lumbar degenerative disc disease   3. Lumbar facet arthropathy   4. Other intervertebral disc degeneration, lumbar region   5. Other intervertebral disc degeneration, thoracic region   6. Chronic pain syndrome        Plan of Care  Tonya Bowen has a current medication list which includes the following long-term medication(s): calcium-vitamin d, cetirizine, lisinopril-hydrochlorothiazide, omeprazole, and gabapentin.  Pharmacotherapy (Medications Ordered): Meds ordered this encounter  Medications   gabapentin (NEURONTIN) 300 MG capsule    Sig: Take 1 capsule (300 mg total) by mouth 2 (two) times daily.    Dispense:  60 capsule    Refill:  5    Follow-up plan:   Return in about 6 months (around 03/06/2021) for Medication Management, virtual.   Recent Visits No visits were found meeting these conditions. Showing recent visits within past 90 days and meeting all other requirements Today's Visits Date Type Provider Dept  09/04/20 Office Visit Gillis Santa, MD [facility]  Showing today's visits and meeting all other requirements Future Appointments No visits were found meeting these conditions. Showing future appointments within next 90 days and meeting all other requirements I discussed the assessment and treatment plan with the patient. The patient was provided an opportunity to ask questions and all were answered. The patient agreed with the plan and demonstrated an understanding of the instructions.  Patient advised to call back or seek an in-person evaluation if the symptoms or condition worsens.  Duration of encounter:20  minutes.  Note by: Gillis Santa, MD Date: 09/04/2020; Time: 2:03 PM

## 2020-09-08 ENCOUNTER — Ambulatory Visit: Payer: Medicare HMO

## 2020-09-10 ENCOUNTER — Ambulatory Visit: Payer: Medicare HMO

## 2020-09-11 ENCOUNTER — Ambulatory Visit: Payer: Medicare HMO | Admitting: Physical Therapy

## 2020-09-16 ENCOUNTER — Ambulatory Visit: Payer: Medicare HMO

## 2020-09-17 ENCOUNTER — Ambulatory Visit: Payer: Medicare HMO

## 2020-09-22 ENCOUNTER — Ambulatory Visit: Payer: Medicare HMO

## 2020-09-23 ENCOUNTER — Ambulatory Visit: Payer: Medicare HMO | Attending: Neurology

## 2020-09-23 DIAGNOSIS — R2689 Other abnormalities of gait and mobility: Secondary | ICD-10-CM | POA: Insufficient documentation

## 2020-09-23 DIAGNOSIS — R2681 Unsteadiness on feet: Secondary | ICD-10-CM | POA: Insufficient documentation

## 2020-09-24 DIAGNOSIS — W19XXXD Unspecified fall, subsequent encounter: Secondary | ICD-10-CM | POA: Diagnosis not present

## 2020-09-24 DIAGNOSIS — M25531 Pain in right wrist: Secondary | ICD-10-CM | POA: Diagnosis not present

## 2020-09-24 DIAGNOSIS — S52514D Nondisplaced fracture of right radial styloid process, subsequent encounter for closed fracture with routine healing: Secondary | ICD-10-CM | POA: Diagnosis not present

## 2020-09-25 ENCOUNTER — Ambulatory Visit: Payer: Medicare HMO

## 2020-09-25 DIAGNOSIS — W010XXA Fall on same level from slipping, tripping and stumbling without subsequent striking against object, initial encounter: Secondary | ICD-10-CM | POA: Diagnosis not present

## 2020-09-25 DIAGNOSIS — Z23 Encounter for immunization: Secondary | ICD-10-CM | POA: Diagnosis not present

## 2020-09-25 DIAGNOSIS — S80212A Abrasion, left knee, initial encounter: Secondary | ICD-10-CM | POA: Diagnosis not present

## 2020-09-25 DIAGNOSIS — S0181XA Laceration without foreign body of other part of head, initial encounter: Secondary | ICD-10-CM | POA: Diagnosis not present

## 2020-09-29 ENCOUNTER — Ambulatory Visit: Payer: Medicare HMO

## 2020-09-29 DIAGNOSIS — H3091 Unspecified chorioretinal inflammation, right eye: Secondary | ICD-10-CM | POA: Diagnosis not present

## 2020-09-29 DIAGNOSIS — H35351 Cystoid macular degeneration, right eye: Secondary | ICD-10-CM | POA: Diagnosis not present

## 2020-09-30 DIAGNOSIS — G629 Polyneuropathy, unspecified: Secondary | ICD-10-CM | POA: Diagnosis not present

## 2020-09-30 DIAGNOSIS — Z79899 Other long term (current) drug therapy: Secondary | ICD-10-CM | POA: Diagnosis not present

## 2020-09-30 DIAGNOSIS — R2681 Unsteadiness on feet: Secondary | ICD-10-CM | POA: Diagnosis not present

## 2020-09-30 DIAGNOSIS — E538 Deficiency of other specified B group vitamins: Secondary | ICD-10-CM | POA: Diagnosis not present

## 2020-09-30 DIAGNOSIS — N183 Chronic kidney disease, stage 3 unspecified: Secondary | ICD-10-CM | POA: Diagnosis not present

## 2020-10-01 ENCOUNTER — Ambulatory Visit: Payer: Medicare HMO

## 2020-10-02 ENCOUNTER — Ambulatory Visit: Payer: Medicare HMO

## 2020-10-05 DIAGNOSIS — M542 Cervicalgia: Secondary | ICD-10-CM | POA: Diagnosis not present

## 2020-10-05 DIAGNOSIS — Z4802 Encounter for removal of sutures: Secondary | ICD-10-CM | POA: Diagnosis not present

## 2020-10-05 DIAGNOSIS — R519 Headache, unspecified: Secondary | ICD-10-CM | POA: Diagnosis not present

## 2020-10-05 DIAGNOSIS — S0990XD Unspecified injury of head, subsequent encounter: Secondary | ICD-10-CM | POA: Diagnosis not present

## 2020-10-06 ENCOUNTER — Ambulatory Visit: Payer: Medicare HMO

## 2020-10-08 ENCOUNTER — Ambulatory Visit: Payer: Medicare HMO

## 2020-10-14 ENCOUNTER — Ambulatory Visit: Payer: Medicare HMO | Admitting: Physical Therapy

## 2020-10-14 ENCOUNTER — Encounter: Payer: Self-pay | Admitting: Physical Therapy

## 2020-10-14 DIAGNOSIS — R2681 Unsteadiness on feet: Secondary | ICD-10-CM

## 2020-10-14 DIAGNOSIS — R2689 Other abnormalities of gait and mobility: Secondary | ICD-10-CM

## 2020-10-14 NOTE — Therapy (Signed)
[facility] [address] [place], [place], [address] Phone: [phone]   Fax:  [phone]  October 14, 2020   No Recipients  Physical Therapy Discharge Summary  Patient: Tonya Bowen  MRN: [id number]  Date of Birth: 04/03/1945   Diagnosis: Unsteadiness on feet  Other abnormalities of gait and mobility Referring Provider (PT): Shah, Hemang   The above patient had been seen in Physical Therapy 2 times of 16 treatments scheduled with 0 no shows and 14 cancellations.  The treatment consisted of balance exercise/HEP The patient is: Unchanged  Subjective: Patient called and cancelled all remaining appointments stating, "my doctor says I can do my exercises at home."  She was last seen on 08/28/20  Discharge Findings: unable to obtain objective measures as patient failed to return to therapy  Functional Status at Discharge: unsure  No Goals Met    Sincerely,   Trotter,Margaret, PT, DPT   CC No Recipients  [facility] [address] [place], [place], [address] Phone: [phone]   Fax:  [phone]  Patient: Tonya Bowen  MRN: [id number]  Date of Birth: 10/01/1945     

## 2020-10-16 ENCOUNTER — Ambulatory Visit: Payer: Medicare HMO | Admitting: Physical Therapy

## 2020-10-21 ENCOUNTER — Ambulatory Visit: Payer: Medicare HMO | Admitting: Physical Therapy

## 2020-10-23 ENCOUNTER — Ambulatory Visit: Payer: Medicare HMO | Admitting: Physical Therapy

## 2020-10-27 DIAGNOSIS — M25531 Pain in right wrist: Secondary | ICD-10-CM | POA: Diagnosis not present

## 2020-10-27 DIAGNOSIS — W010XXD Fall on same level from slipping, tripping and stumbling without subsequent striking against object, subsequent encounter: Secondary | ICD-10-CM | POA: Diagnosis not present

## 2020-10-27 DIAGNOSIS — S52514D Nondisplaced fracture of right radial styloid process, subsequent encounter for closed fracture with routine healing: Secondary | ICD-10-CM | POA: Diagnosis not present

## 2020-10-28 ENCOUNTER — Ambulatory Visit: Payer: Medicare HMO | Admitting: Physical Therapy

## 2020-10-30 ENCOUNTER — Ambulatory Visit: Payer: Medicare HMO | Admitting: Physical Therapy

## 2020-10-31 ENCOUNTER — Encounter: Payer: Self-pay | Admitting: Oncology

## 2020-10-31 ENCOUNTER — Inpatient Hospital Stay: Payer: Medicare HMO | Attending: Oncology | Admitting: Oncology

## 2020-10-31 ENCOUNTER — Other Ambulatory Visit: Payer: Self-pay | Admitting: *Deleted

## 2020-10-31 ENCOUNTER — Inpatient Hospital Stay: Payer: Medicare HMO

## 2020-10-31 ENCOUNTER — Inpatient Hospital Stay: Payer: Medicare HMO | Admitting: Oncology

## 2020-10-31 VITALS — BP 106/68 | HR 71 | Temp 96.7°F | Resp 16 | Wt 212.8 lb

## 2020-10-31 DIAGNOSIS — M8580 Other specified disorders of bone density and structure, unspecified site: Secondary | ICD-10-CM

## 2020-10-31 DIAGNOSIS — Z170 Estrogen receptor positive status [ER+]: Secondary | ICD-10-CM | POA: Insufficient documentation

## 2020-10-31 DIAGNOSIS — C50412 Malignant neoplasm of upper-outer quadrant of left female breast: Secondary | ICD-10-CM | POA: Diagnosis not present

## 2020-10-31 DIAGNOSIS — C50912 Malignant neoplasm of unspecified site of left female breast: Secondary | ICD-10-CM

## 2020-10-31 LAB — COMPREHENSIVE METABOLIC PANEL
ALT: 14 U/L (ref 0–44)
AST: 18 U/L (ref 15–41)
Albumin: 3.9 g/dL (ref 3.5–5.0)
Alkaline Phosphatase: 63 U/L (ref 38–126)
Anion gap: 7 (ref 5–15)
BUN: 25 mg/dL — ABNORMAL HIGH (ref 8–23)
CO2: 26 mmol/L (ref 22–32)
Calcium: 9.2 mg/dL (ref 8.9–10.3)
Chloride: 104 mmol/L (ref 98–111)
Creatinine, Ser: 1.09 mg/dL — ABNORMAL HIGH (ref 0.44–1.00)
GFR, Estimated: 53 mL/min — ABNORMAL LOW (ref >60)
Glucose, Bld: 136 mg/dL — ABNORMAL HIGH (ref 70–99)
Potassium: 4.1 mmol/L (ref 3.5–5.1)
Sodium: 137 mmol/L (ref 135–145)
Total Bilirubin: 0.7 mg/dL (ref 0.3–1.2)
Total Protein: 6.6 g/dL (ref 6.5–8.1)

## 2020-10-31 LAB — CBC WITH DIFFERENTIAL/PLATELET
Abs Immature Granulocytes: 0.02 10*3/uL (ref 0.00–0.07)
Basophils Absolute: 0.1 10*3/uL (ref 0.0–0.1)
Basophils Relative: 1 %
Eosinophils Absolute: 0.3 10*3/uL (ref 0.0–0.5)
Eosinophils Relative: 4 %
HCT: 37 % (ref 36.0–46.0)
Hemoglobin: 12.2 g/dL (ref 12.0–15.0)
Immature Granulocytes: 0 %
Lymphocytes Relative: 11 %
Lymphs Abs: 0.8 10*3/uL (ref 0.7–4.0)
MCH: 30.6 pg (ref 26.0–34.0)
MCHC: 33 g/dL (ref 30.0–36.0)
MCV: 92.7 fL (ref 80.0–100.0)
Monocytes Absolute: 0.5 10*3/uL (ref 0.1–1.0)
Monocytes Relative: 6 %
Neutro Abs: 6.2 10*3/uL (ref 1.7–7.7)
Neutrophils Relative %: 78 %
Platelets: 229 10*3/uL (ref 150–400)
RBC: 3.99 MIL/uL (ref 3.87–5.11)
RDW: 11.7 % (ref 11.5–15.5)
WBC: 7.9 10*3/uL (ref 4.0–10.5)
nRBC: 0 % (ref 0.0–0.2)

## 2020-10-31 MED ORDER — DENOSUMAB 60 MG/ML ~~LOC~~ SOSY
60.0000 mg | PREFILLED_SYRINGE | Freq: Once | SUBCUTANEOUS | Status: AC
  Administered 2020-10-31: 60 mg via SUBCUTANEOUS
  Filled 2020-10-31: qty 1

## 2020-10-31 MED ORDER — DENOSUMAB 60 MG/ML ~~LOC~~ SOSY: 60.0000 mg | PREFILLED_SYRINGE | Freq: Once | SUBCUTANEOUS | Status: DC

## 2020-10-31 NOTE — Progress Notes (Addendum)
Hematology/Oncology follow-up note [facility] Telephone:([phone] Fax:(336) [phone]   Patient Care Team: Juluis Pitch, MD as PCP - General (Family Medicine) Rico Junker, RN as Registered Nurse Bary Castilla, Forest Gleason, MD as Consulting Physician (General Surgery) Noreene Filbert, MD as Referring Physician (Radiation Oncology) Earlie Server, MD as Consulting Physician (Oncology)  REFERRING PROVIDER: Juluis Pitch, MD  CHIEF COMPLAINTS/REASON FOR VISIT:  Follow-up for breast cancer management  HISTORY OF PRESENTING ILLNESS:  Tonya Bowen is a  75 y.o.  female with PMH listed below was seen in consultation at the request of  Juluis Pitch, MD  for evaluation of breast cancer  12/04/2019 Screening Mammogram showed left breast mass.  12/19/2019 Unilateral left breast diagnostic mammogram showed  40m irregular left upper outer breast mass, no left axillary lymphadenopathy.  12/27/2019 She underwent biopsy of left breast mass, pathology showed invasive mammary carcinoma, with mucinous features. Grade 2, no DCIS, no LVI, ER 90%+, PR 90%+, HER2 -  Patient was referred to establish care and discuss management plan.  Family history of breast cancer: paternal aunt x 2 breast cancer Family history of other cancers: brother lung cancer, mother possible uterine cancer  Menarche: 89Menopause: hystectomy in 89sAge at first live childbirth:23 Used OCP: about 10 years Used estrogen and progesterone therapy: 20+ years HRT History of Radiation to the chest: none Previous of breast biopsy: none   # 02/04/2020, patient has underwent left lumpectomy with sentinel lymph node biopsy. 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  DCIS, negative margin. She also had atypical lobular hyperplasia.  3 sentinel lymph nodes excision all negative. pT2 N0. Oncotype DX recurrence score 5.  No adjuvant chemotherapy was offered.  #Family history  of breast cancer, Genetic testing was done and was negative. #04/29/2020, finished adjuvant radiation therapy #05/14/2020, started on Arimidex.   INTERVAL HISTORY Mrs. SSchroyerpresents to clinic today for follow-up and management of her left breast cancer.  She is currently on Arimidex.  Reports occasional hot flash and some chronic back pain on gabapentin and followed by pain clinic.  Reports some imbalance concerns and has been seen by neurology. Reports some "itchy spots" on her legs and arms. Has tried steroid cream without relief. Feels tired.    Review of Systems  Constitutional:  Positive for fatigue. Negative for appetite change, chills and fever.  HENT:   Negative for hearing loss and voice change.   Eyes:  Negative for eye problems.  Respiratory:  Negative for chest tightness and cough.   Cardiovascular:  Negative for chest pain.  Gastrointestinal:  Negative for abdominal distention, abdominal pain and blood in stool.  Endocrine: Positive for hot flashes.  Genitourinary:  Negative for difficulty urinating and frequency.   Musculoskeletal:  Positive for back pain. Negative for arthralgias.  Skin:  Positive for itching. Negative for rash.  Neurological:  Negative for extremity weakness.  Hematological:  Negative for adenopathy.  Psychiatric/Behavioral:  Negative for confusion.    MEDICAL HISTORY:  Past Medical History:  Diagnosis Date   Arthritis    Cancer ([place] 12/27/2019   left breast   Chronic back pain    Complication of anesthesia    pt states she felt every thing during breast augmentation surgery   Diabetes mellitus without complication ([facility]    Family history of breast cancer    Family history of lung cancer    Family history of uterine cancer    GERD (gastroesophageal reflux disease)    Hypertension  Prediabetes    Stomach ulcer     SURGICAL HISTORY: Past Surgical History:  Procedure Laterality Date   ABDOMINAL HYSTERECTOMY     AUGMENTATION MAMMAPLASTY  Bilateral 1987   BREAST ENHANCEMENT SURGERY     BREAST LUMPECTOMY WITH SENTINEL LYMPH NODE BIOPSY Left 02/04/2020   Procedure: BREAST LUMPECTOMY WITH SENTINEL LYMPH NODE BX;  Surgeon: Robert Bellow, MD;  Location: [facility];  Service: General;  Laterality: Left;   BUNIONECTOMY Bilateral    Great toes   CATARACT EXTRACTION Left    colonoscopy     KYPHOPLASTY N/A 02/06/2014   Procedure:  L2 KYPHOPLASTY;  Surgeon: Melina Schools, MD;  Location: [facility];  Service: Orthopedics;  Laterality: N/A;   RADIAL KERATOTOMY     TONSILLECTOMY     UPPER GI ENDOSCOPY      SOCIAL HISTORY: Social History   Socioeconomic History   Marital status: Divorced    Spouse name: Not on file   Number of children: Not on file   Years of education: Not on file   Highest education level: Not on file  Occupational History   Not on file  Tobacco Use   Smoking status: Never   Smokeless tobacco: Never  Vaping Use   Vaping Use: Never used  Substance and Sexual Activity   Alcohol use: Yes    Alcohol/week: 2.0 standard drinks    Types: 2 Glasses of wine per week    Comment: socially   Drug use: No   Sexual activity: Not Currently  Other Topics Concern   Not on file  Social History Narrative   Not on file   Social Determinants of Health   Financial Resource Strain: Not on file  Food Insecurity: Not on file  Transportation Needs: Not on file  Physical Activity: Not on file  Stress: Not on file  Social Connections: Not on file  Intimate Partner Violence: Not on file    FAMILY HISTORY: Family History  Problem Relation Age of Onset   Breast cancer Paternal Aunt    Breast cancer Paternal Aunt    Uterine cancer Mother        probable   Lung cancer Brother        metastatic disease; unsure if lung was pimary   Breast cancer Paternal 14    ALS Daughter    Breast cancer Cousin        dx 49s   Breast cancer Cousin        dx 62s   Breast cancer Cousin        dx 77s   Lung cancer Cousin      ALLERGIES:  has No Known Allergies.  MEDICATIONS:  Current Outpatient Medications  Medication Sig Dispense Refill   acetaminophen (TYLENOL) 325 MG tablet Take 650 mg by mouth every 6 (six) hours as needed for moderate pain.      anastrozole (ARIMIDEX) 1 MG tablet TAKE 1 TABLET BY MOUTH EVERY DAY 90 tablet 1   atorvastatin (LIPITOR) 40 MG tablet      brimonidine-timolol (COMBIGAN) 0.2-0.5 % ophthalmic solution Place 1 drop into the right eye in the morning and at bedtime.     calcium-vitamin D (OSCAL WITH D) 250-125 MG-UNIT tablet Take 1 tablet by mouth daily.     cetirizine (ZYRTEC) 10 MG tablet Take 10 mg by mouth every morning.      Cyanocobalamin (VITAMIN B 12 PO) Take by mouth.     gabapentin (NEURONTIN) 300 MG capsule Take 1 capsule (  300 mg total) by mouth 2 (two) times daily. 60 capsule 5   lisinopril-hydrochlorothiazide (ZESTORETIC) 20-12.5 MG tablet Take 1 tablet by mouth every morning.      metaxalone (SKELAXIN) 800 MG tablet May take 1/2 to 1 tablet up to 3 times daily as needed for pain or spasm.     omeprazole (PRILOSEC) 40 MG capsule Take 1 tablet by mouth every morning.      Semaglutide (OZEMPIC, 0.25 OR 0.5 MG/DOSE, [place]) Inject 0.25 mg into the skin once a week.      vitamin C (ASCORBIC ACID) 500 MG tablet Take 500 mg by mouth daily.     Vitamin D, Ergocalciferol, (DRISDOL) 1.25 MG (50000 UT) CAPS capsule Take 50,000 Units by mouth every 7 (seven) days.      No current facility-administered medications for this visit.     PHYSICAL EXAMINATION: ECOG PERFORMANCE STATUS: 0 - Asymptomatic Vitals:   10/31/20 1313  BP: 106/68  Pulse: 71  Resp: 16  Temp: (!) 96.7 F (35.9 C)   Filed Weights   10/31/20 1313  Weight: 212 lb 12.8 oz (96.5 kg)    Physical Exam Constitutional:      Appearance: Normal appearance.  HENT:     Head: Normocephalic and atraumatic.  Eyes:     Pupils: Pupils are equal, round, and reactive to light.  Cardiovascular:     Rate and Rhythm:  Normal rate and regular rhythm.     Heart sounds: Normal heart sounds. No murmur heard. Pulmonary:     Effort: Pulmonary effort is normal.     Breath sounds: Normal breath sounds. No wheezing.  Abdominal:     General: Bowel sounds are normal. There is no distension.     Palpations: Abdomen is soft.     Tenderness: There is no abdominal tenderness.  Musculoskeletal:        General: Normal range of motion.     Cervical back: Normal range of motion.  Skin:    General: Skin is warm and dry.     Findings: No rash.  Neurological:     Mental Status: She is alert and oriented to person, place, and time.  Psychiatric:        Judgment: Judgment normal.  Left breast examination was performed.  Focal erythema/hyperpigmentation of left breast skin.   LABORATORY DATA:  I have reviewed the data as listed Lab Results  Component Value Date   WBC 7.9 10/31/2020   HGB 12.2 10/31/2020   HCT 37.0 10/31/2020   MCV 92.7 10/31/2020   PLT 229 10/31/2020   Recent Labs    01/03/20 1505 01/11/20 1145 07/01/20 1336 10/31/20 1235  NA 138 137 141 137  K 4.0 4.2 4.2 4.1  CL 101 101 102 104  CO2 '26 29 28 26  ' GLUCOSE 116* 118* 86 136*  BUN 32* 32* 28* 25*  CREATININE 1.58* 1.39* 1.28* 1.09*  CALCIUM 9.2 9.5 9.1 9.2  GFRNONAA 34* 40* 44* 53*  PROT 7.4  --  7.0 6.6  ALBUMIN 4.3  --  4.0 3.9  AST 26  --  18 18  ALT 22  --  17 14  ALKPHOS 71  --  75 63  BILITOT 0.7  --  0.6 0.7   Iron/TIBC/Ferritin/ %Sat No results found for: IRON, TIBC, FERRITIN, IRONPCTSAT    RADIOGRAPHIC STUDIES: I have personally reviewed the radiological images as listed and agreed with the findings in the report. No results found.   ASSESSMENT & PLAN:  1. Malignant neoplasm of upper-outer quadrant of left breast in female, estrogen receptor positive ([place])   2. Osteopenia, unspecified location   Cancer Staging Malignant neoplasm of upper-outer quadrant of left breast in female, estrogen receptor positive  ([place]) Staging form: Breast, AJCC 8th Edition - Clinical: Stage IB (cT2, cN0, cM0, G2, ER+, PR+, HER2-) - Signed by Earlie Server, MD on 01/03/2020 - Pathologic stage from 02/26/2020: Stage IA (pT2, pN0, cM0, G2, ER+, PR+, HER2-, Oncotype DX score: 5) - Signed by Earlie Server, MD on 02/26/2020  Left breast cancer- Underwent left lumpectomy with sentinel lymph node biopsy confirming disease.  She completed adjuvant XRT in February 2022 and was started on Arimidex in March 2022.  She will be due for a mammogram in October 2022.  Orders placed today.  Patient had baseline bone density scan in August 2021 which showed osteopenia.  She was started on calcium and vitamin D supplements.  They discussed bisphosphonate therapy but needed dental clearance first. Received dental clearance and scanned into media. Proceed with Prolia today. RTC in 3 months for labs, review mammogram and md assessment. Prolia in 6 months.   Will send in some kenalog cream for dermatitis on her lower extremities.   I spent 15 minutes dedicated to the care of this patient (face-to-face and non-face-to-face) on the date of the encounter to include what is described in the assessment and plan.  All questions were answered. The patient knows to call the clinic with any problems questions or concerns.  cc Juluis Pitch, MD   Faythe Casa, NP 11/02/2020 8:27 PM

## 2020-10-31 NOTE — Progress Notes (Signed)
Patient has new itching in different areas of her body.  Has noticed new hard places on left breast for about 1 week.  Feeling extremely tired and has difficulty sleeping.

## 2020-11-02 ENCOUNTER — Encounter: Payer: Self-pay | Admitting: Oncology

## 2020-11-03 DIAGNOSIS — H35351 Cystoid macular degeneration, right eye: Secondary | ICD-10-CM | POA: Diagnosis not present

## 2020-11-04 ENCOUNTER — Ambulatory Visit: Payer: Medicare HMO | Admitting: Physical Therapy

## 2020-11-04 DIAGNOSIS — H3091 Unspecified chorioretinal inflammation, right eye: Secondary | ICD-10-CM | POA: Diagnosis not present

## 2020-11-04 MED ORDER — TRIAMCINOLONE ACETONIDE 0.5 % EX OINT: 1.0000 "application " | TOPICAL_OINTMENT | Freq: Two times a day (BID) | CUTANEOUS | 0 refills | Status: DC

## 2020-11-04 NOTE — Addendum Note (Signed)
Addended by: Faythe Casa E on: 11/04/2020 08:58 AM   Modules accepted: Orders

## 2020-11-06 DIAGNOSIS — J069 Acute upper respiratory infection, unspecified: Secondary | ICD-10-CM | POA: Diagnosis not present

## 2020-11-06 DIAGNOSIS — L299 Pruritus, unspecified: Secondary | ICD-10-CM | POA: Diagnosis not present

## 2020-11-06 DIAGNOSIS — R051 Acute cough: Secondary | ICD-10-CM | POA: Diagnosis not present

## 2020-11-13 DIAGNOSIS — H02401 Unspecified ptosis of right eyelid: Secondary | ICD-10-CM | POA: Diagnosis not present

## 2020-11-19 DIAGNOSIS — E663 Overweight: Secondary | ICD-10-CM | POA: Diagnosis not present

## 2020-11-19 DIAGNOSIS — N1831 Chronic kidney disease, stage 3a: Secondary | ICD-10-CM | POA: Diagnosis not present

## 2020-11-19 DIAGNOSIS — E7800 Pure hypercholesterolemia, unspecified: Secondary | ICD-10-CM | POA: Diagnosis not present

## 2020-11-19 DIAGNOSIS — E1122 Type 2 diabetes mellitus with diabetic chronic kidney disease: Secondary | ICD-10-CM | POA: Diagnosis not present

## 2020-11-19 DIAGNOSIS — I10 Essential (primary) hypertension: Secondary | ICD-10-CM | POA: Diagnosis not present

## 2020-11-20 ENCOUNTER — Other Ambulatory Visit: Payer: Self-pay | Admitting: Nephrology

## 2020-11-20 ENCOUNTER — Other Ambulatory Visit (HOSPITAL_COMMUNITY): Payer: Self-pay | Admitting: Nephrology

## 2020-11-20 DIAGNOSIS — N1831 Chronic kidney disease, stage 3a: Secondary | ICD-10-CM

## 2020-11-24 ENCOUNTER — Encounter: Payer: Self-pay | Admitting: Radiation Oncology

## 2020-11-24 ENCOUNTER — Other Ambulatory Visit: Payer: Self-pay

## 2020-11-24 ENCOUNTER — Ambulatory Visit
Admission: RE | Admit: 2020-11-24 | Discharge: 2020-11-24 | Disposition: A | Discharge status: Home / Self Care | Payer: Medicare HMO | Source: Ambulatory Visit | Attending: Radiation Oncology | Admitting: Radiation Oncology

## 2020-11-24 VITALS — BP 127/56 | HR 72 | Temp 97.3°F | Resp 16 | Wt 211.4 lb

## 2020-11-24 DIAGNOSIS — C50412 Malignant neoplasm of upper-outer quadrant of left female breast: Secondary | ICD-10-CM

## 2020-11-24 DIAGNOSIS — Z79811 Long term (current) use of aromatase inhibitors: Secondary | ICD-10-CM | POA: Insufficient documentation

## 2020-11-24 DIAGNOSIS — Z170 Estrogen receptor positive status [ER+]: Secondary | ICD-10-CM | POA: Insufficient documentation

## 2020-11-24 DIAGNOSIS — Z08 Encounter for follow-up examination after completed treatment for malignant neoplasm: Secondary | ICD-10-CM | POA: Diagnosis not present

## 2020-11-24 DIAGNOSIS — Z923 Personal history of irradiation: Secondary | ICD-10-CM | POA: Insufficient documentation

## 2020-11-24 DIAGNOSIS — Z853 Personal history of malignant neoplasm of breast: Secondary | ICD-10-CM | POA: Diagnosis not present

## 2020-11-24 NOTE — Progress Notes (Signed)
Radiation Oncology Follow up Note  Name: Tonya Bowen   Date:   11/24/2020 MRN:  [id number] DOB: 03/04/1946    This 75 y.o. female presents to the clinic today for 44-monthfollow-up status whole breast radiation to her left breast for stage Ib (T2 N0 M0) ER/PR positive invasive mammary carcinoma.  REFERRING PROVIDER: BJuluis Pitch MD  HPI: Patient is a 75year old female now out 6 months having completed whole breast radiation to her left breast for stage Ib ER/PR positive invasive mammary carcinoma.  Seen today in routine follow-up she is doing well.  She specifically denies breast tenderness cough or bone pain..  She is currently on Arimidex tolerating that well without side effect.  She is scheduled for mammography later this month.  COMPLICATIONS OF TREATMENT: none  FOLLOW UP COMPLIANCE: keeps appointments   PHYSICAL EXAM:  BP (!) 127/56 (BP Location: Left Arm, Patient Position: Sitting)   Pulse 72   Temp (!) 97.3 F (36.3 C) (Tympanic)   Resp 16   Wt 211 lb 6.4 oz (95.9 kg)   BMI 36.29 kg/m  Lungs are clear to A&P cardiac examination essentially unremarkable with regular rate and rhythm. No dominant mass or nodularity is noted in either breast in 2 positions examined. Incision is well-healed. No axillary or supraclavicular adenopathy is appreciated. Cosmetic result is excellent.  There is firmness in her lumpectomy site may be secondary to seroma.  Well-developed well-nourished patient in NAD. HEENT reveals PERLA, EOMI, discs not visualized.  Oral cavity is clear. No oral mucosal lesions are identified. Neck is clear without evidence of cervical or supraclavicular adenopathy. Lungs are clear to A&P. Cardiac examination is essentially unremarkable with regular rate and rhythm without murmur rub or thrill. Abdomen is benign with no organomegaly or masses noted. Motor sensory and DTR levels are equal and symmetric in the upper and lower extremities. Cranial nerves II through XII are  grossly intact. Proprioception is intact. No peripheral adenopathy or edema is identified. No motor or sensory levels are noted. Crude visual fields are within normal range.  RADIOLOGY RESULTS: No current films to review mammograms will be reviewed reviewed of her September exam when available  PLAN: Present time patient now 6 months out doing well with no evidence of disease.  And pleased with her overall progress.  She continues on Arimidex without side effect.  She has mammograms later this month.  I have asked to see her back in 6 months for follow-up and then will start once year follow-up visits.  Patient knows to call with any concerns.  I would like to take this opportunity to thank you for allowing me to participate in the care of your patient..Noreene Filbert MD

## 2020-11-25 DIAGNOSIS — I10 Essential (primary) hypertension: Secondary | ICD-10-CM | POA: Diagnosis not present

## 2020-11-25 DIAGNOSIS — E785 Hyperlipidemia, unspecified: Secondary | ICD-10-CM | POA: Diagnosis not present

## 2020-12-02 ENCOUNTER — Ambulatory Visit
Admission: RE | Admit: 2020-12-02 | Discharge: 2020-12-02 | Disposition: A | Discharge status: Home / Self Care | Payer: Medicare HMO | Source: Ambulatory Visit | Attending: Nephrology | Admitting: Nephrology

## 2020-12-02 ENCOUNTER — Other Ambulatory Visit: Payer: Self-pay

## 2020-12-02 DIAGNOSIS — N1831 Chronic kidney disease, stage 3a: Secondary | ICD-10-CM | POA: Diagnosis not present

## 2020-12-02 DIAGNOSIS — N183 Chronic kidney disease, stage 3 unspecified: Secondary | ICD-10-CM | POA: Diagnosis not present

## 2020-12-04 ENCOUNTER — Ambulatory Visit
Admission: RE | Admit: 2020-12-04 | Discharge: 2020-12-04 | Disposition: A | Discharge status: Home / Self Care | Payer: Medicare HMO | Source: Ambulatory Visit | Attending: Oncology | Admitting: Oncology

## 2020-12-04 ENCOUNTER — Other Ambulatory Visit: Payer: Self-pay

## 2020-12-04 DIAGNOSIS — Z170 Estrogen receptor positive status [ER+]: Secondary | ICD-10-CM | POA: Diagnosis not present

## 2020-12-04 DIAGNOSIS — R922 Inconclusive mammogram: Secondary | ICD-10-CM | POA: Diagnosis not present

## 2020-12-04 DIAGNOSIS — C50412 Malignant neoplasm of upper-outer quadrant of left female breast: Secondary | ICD-10-CM | POA: Insufficient documentation

## 2020-12-09 DIAGNOSIS — H35351 Cystoid macular degeneration, right eye: Secondary | ICD-10-CM | POA: Diagnosis not present

## 2020-12-10 DIAGNOSIS — B372 Candidiasis of skin and nail: Secondary | ICD-10-CM | POA: Diagnosis not present

## 2020-12-17 DIAGNOSIS — E7800 Pure hypercholesterolemia, unspecified: Secondary | ICD-10-CM | POA: Diagnosis not present

## 2020-12-17 DIAGNOSIS — E663 Overweight: Secondary | ICD-10-CM | POA: Diagnosis not present

## 2020-12-17 DIAGNOSIS — N1831 Chronic kidney disease, stage 3a: Secondary | ICD-10-CM | POA: Diagnosis not present

## 2020-12-17 DIAGNOSIS — E1122 Type 2 diabetes mellitus with diabetic chronic kidney disease: Secondary | ICD-10-CM | POA: Diagnosis not present

## 2020-12-17 DIAGNOSIS — I10 Essential (primary) hypertension: Secondary | ICD-10-CM | POA: Diagnosis not present

## 2020-12-30 DIAGNOSIS — N390 Urinary tract infection, site not specified: Secondary | ICD-10-CM | POA: Diagnosis not present

## 2021-01-12 DIAGNOSIS — R300 Dysuria: Secondary | ICD-10-CM | POA: Diagnosis not present

## 2021-01-12 DIAGNOSIS — N390 Urinary tract infection, site not specified: Secondary | ICD-10-CM | POA: Diagnosis not present

## 2021-01-12 DIAGNOSIS — R319 Hematuria, unspecified: Secondary | ICD-10-CM | POA: Diagnosis not present

## 2021-01-14 ENCOUNTER — Encounter: Payer: Self-pay | Admitting: Oncology

## 2021-01-15 NOTE — Telephone Encounter (Signed)
Please advise 

## 2021-02-02 ENCOUNTER — Other Ambulatory Visit: Payer: Medicare HMO

## 2021-02-02 ENCOUNTER — Ambulatory Visit: Payer: Medicare HMO | Admitting: Oncology

## 2021-02-10 DIAGNOSIS — H3091 Unspecified chorioretinal inflammation, right eye: Secondary | ICD-10-CM | POA: Diagnosis not present

## 2021-02-10 DIAGNOSIS — H35351 Cystoid macular degeneration, right eye: Secondary | ICD-10-CM | POA: Diagnosis not present

## 2021-02-13 DIAGNOSIS — H02401 Unspecified ptosis of right eyelid: Secondary | ICD-10-CM | POA: Diagnosis not present

## 2021-02-17 ENCOUNTER — Inpatient Hospital Stay: Payer: Medicare HMO

## 2021-02-17 ENCOUNTER — Other Ambulatory Visit: Payer: Self-pay

## 2021-02-17 ENCOUNTER — Encounter: Payer: Self-pay | Admitting: Oncology

## 2021-02-17 ENCOUNTER — Inpatient Hospital Stay: Payer: Medicare HMO | Attending: Oncology | Admitting: Oncology

## 2021-02-17 VITALS — BP 111/70 | HR 68 | Temp 97.0°F | Resp 18 | Wt 217.8 lb

## 2021-02-17 DIAGNOSIS — M8580 Other specified disorders of bone density and structure, unspecified site: Secondary | ICD-10-CM | POA: Diagnosis not present

## 2021-02-17 DIAGNOSIS — Z170 Estrogen receptor positive status [ER+]: Secondary | ICD-10-CM | POA: Insufficient documentation

## 2021-02-17 DIAGNOSIS — C50412 Malignant neoplasm of upper-outer quadrant of left female breast: Secondary | ICD-10-CM

## 2021-02-17 DIAGNOSIS — Z79811 Long term (current) use of aromatase inhibitors: Secondary | ICD-10-CM | POA: Diagnosis not present

## 2021-02-17 LAB — CBC WITH DIFFERENTIAL/PLATELET
Abs Immature Granulocytes: 0.03 10*3/uL (ref 0.00–0.07)
Basophils Absolute: 0 10*3/uL (ref 0.0–0.1)
Basophils Relative: 1 %
Eosinophils Absolute: 0.2 10*3/uL (ref 0.0–0.5)
Eosinophils Relative: 3 %
HCT: 38.4 % (ref 36.0–46.0)
Hemoglobin: 12.9 g/dL (ref 12.0–15.0)
Immature Granulocytes: 0 %
Lymphocytes Relative: 12 %
Lymphs Abs: 1 10*3/uL (ref 0.7–4.0)
MCH: 30.9 pg (ref 26.0–34.0)
MCHC: 33.6 g/dL (ref 30.0–36.0)
MCV: 91.9 fL (ref 80.0–100.0)
Monocytes Absolute: 0.5 10*3/uL (ref 0.1–1.0)
Monocytes Relative: 6 %
Neutro Abs: 6.2 10*3/uL (ref 1.7–7.7)
Neutrophils Relative %: 78 %
Platelets: 257 10*3/uL (ref 150–400)
RBC: 4.18 MIL/uL (ref 3.87–5.11)
RDW: 12 % (ref 11.5–15.5)
WBC: 7.9 10*3/uL (ref 4.0–10.5)
nRBC: 0 % (ref 0.0–0.2)

## 2021-02-17 LAB — COMPREHENSIVE METABOLIC PANEL
ALT: 13 U/L (ref 0–44)
AST: 16 U/L (ref 15–41)
Albumin: 3.9 g/dL (ref 3.5–5.0)
Alkaline Phosphatase: 85 U/L (ref 38–126)
Anion gap: 12 (ref 5–15)
BUN: 28 mg/dL — ABNORMAL HIGH (ref 8–23)
CO2: 28 mmol/L (ref 22–32)
Calcium: 9.6 mg/dL (ref 8.9–10.3)
Chloride: 98 mmol/L (ref 98–111)
Creatinine, Ser: 1.42 mg/dL — ABNORMAL HIGH (ref 0.44–1.00)
GFR, Estimated: 39 mL/min — ABNORMAL LOW (ref >60)
Glucose, Bld: 163 mg/dL — ABNORMAL HIGH (ref 70–99)
Potassium: 4.3 mmol/L (ref 3.5–5.1)
Sodium: 138 mmol/L (ref 135–145)
Total Bilirubin: 0.5 mg/dL (ref 0.3–1.2)
Total Protein: 7 g/dL (ref 6.5–8.1)

## 2021-02-17 MED ORDER — ANASTROZOLE 1 MG PO TABS: 1.0000 mg | ORAL_TABLET | Freq: Every day | ORAL | 1 refills | Status: DC

## 2021-02-17 NOTE — Progress Notes (Signed)
Pt here for follow up. Pt reports feeling tireness and achy joints.

## 2021-02-17 NOTE — Progress Notes (Signed)
Hematology/Oncology follow-up note  [phone] [phone]   Patient Care Team: Juluis Pitch, MD as PCP - General (Family Medicine) Rico Junker, RN as Registered Nurse Bary Castilla, Forest Gleason, MD as Consulting Physician (General Surgery) Noreene Filbert, MD as Referring Physician (Radiation Oncology) Earlie Server, MD as Consulting Physician (Oncology)  REFERRING PROVIDER: Juluis Pitch, MD  CHIEF COMPLAINTS/REASON FOR VISIT:  Follow-up for breast cancer management  HISTORY OF PRESENTING ILLNESS:   Tonya Bowen is a  75 y.o.  female with PMH listed below was seen in consultation at the request of  Juluis Pitch, MD  for evaluation of breast cancer  12/04/2019 Screening Mammogram showed left breast mass.  12/19/2019 Unilateral left breast diagnostic mammogram showed  21m irregular left upper outer breast mass, no left axillary lymphadenopathy.  12/27/2019 She underwent biopsy of left breast mass, pathology showed invasive mammary carcinoma, with mucinous features. Grade 2, no DCIS, no LVI, ER 90%+, PR 90%+, HER2 -  Patient was referred to establish care and discuss management plan.  Family history of breast cancer: paternal aunt x 2 breast cancer Family history of other cancers: brother lung cancer, mother possible uterine cancer  Menarche: 89Menopause: hystectomy in 89sAge at first live childbirth:23 Used OCP: about 10 years Used estrogen and progesterone therapy: 20+ years HRT History of Radiation to the chest: none Previous of breast biopsy: none   # 02/04/2020, patient has underwent left lumpectomy with sentinel lymph node biopsy. 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  DCIS, negative margin. She also had atypical lobular hyperplasia.  3 sentinel lymph nodes excision all negative. pT2 N0. Oncotype DX recurrence score 5.  No adjuvant chemotherapy was offered.  #Family history of breast cancer, Genetic  testing was done and was negative. #04/29/2020, finished adjuvant radiation therapy #05/14/2020, started on Arimidex.  She has balance difficulties and she was recently seen by neurology Dr. SManuella Ghazion 06/04/2020.  Patient had work-up including SPEP and immunofixation.  Labs reviewed.  No M protein was identified.  INTERVAL HISTORY Tonya MARESis a 75y.o. female who has above history reviewed by me today presents for follow up visit for management of left breast cancer, pT2N0 , ER/PR positive, HER-2 negative. Problems and complaints are listed below: Patient has had chronic aches and pains.  Manageable.  On Arimidex. Occasional hot flash.   Review of Systems  Constitutional:  Negative for appetite change, chills, fatigue and fever.  HENT:   Negative for hearing loss and voice change.   Eyes:  Negative for eye problems.  Respiratory:  Negative for chest tightness and cough.   Cardiovascular:  Negative for chest pain.  Gastrointestinal:  Negative for abdominal distention, abdominal pain and blood in stool.  Endocrine: Negative for hot flashes.  Genitourinary:  Negative for difficulty urinating and frequency.   Musculoskeletal:  Positive for arthralgias and back pain.  Skin:  Negative for itching and rash.  Neurological:  Negative for extremity weakness.  Hematological:  Negative for adenopathy.  Psychiatric/Behavioral:  Negative for confusion.    MEDICAL HISTORY:  Past Medical History:  Diagnosis Date   Arthritis    [place]   left breast   Chronic back pain    Complication of anesthesia    pt states she felt every thing during breast augmentation surgery   [place]    Family history of breast cancer    Family history of lung cancer    Family history of  uterine cancer    GERD (gastroesophageal reflux disease)    Hypertension    Prediabetes    Stomach ulcer     SURGICAL HISTORY: Past Surgical History:  Procedure Laterality  Date   ABDOMINAL HYSTERECTOMY     AUGMENTATION MAMMAPLASTY Bilateral 1987   BREAST ENHANCEMENT SURGERY     BREAST LUMPECTOMY Left 01/2020   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   BREAST LUMPECTOMY WITH SENTINEL LYMPH NODE BIOPSY Left 02/04/2020   Procedure: BREAST LUMPECTOMY WITH SENTINEL LYMPH NODE BX;  Surgeon: Robert Bellow, MD;  Location: [facility];  Service: General;  Laterality: Left;   BUNIONECTOMY Bilateral    Great toes   CATARACT EXTRACTION Left    colonoscopy     KYPHOPLASTY N/A 02/06/2014   Procedure:  L2 KYPHOPLASTY;  Surgeon: Melina Schools, MD;  Location: [facility];  Service: Orthopedics;  Laterality: N/A;   RADIAL KERATOTOMY     TONSILLECTOMY     UPPER GI ENDOSCOPY      SOCIAL HISTORY: Social History   Socioeconomic History   Marital status: Divorced    Spouse name: Not on file   Number of children: Not on file   Years of education: Not on file   Highest education level: Not on file  Occupational History   Not on file  Tobacco Use   Smoking status: Never   Smokeless tobacco: Never  Vaping Use   Vaping Use: Never used  Substance and Sexual Activity   Alcohol use: Yes    Alcohol/week: 2.0 standard drinks    Types: 2 Glasses of wine per week    Comment: socially   Drug use: No   Sexual activity: Not Currently  Other Topics Concern   Not on file  Social History Narrative   Not on file   Social Determinants of Health   Financial Resource Strain: Not on file  Food Insecurity: Not on file  Transportation Needs: Not on file  Physical Activity: Not on file  Stress: Not on file  Social Connections: Not on file  Intimate Partner Violence: Not on file    FAMILY HISTORY: Family History  Problem Relation Age of Onset   Breast cancer Paternal Aunt    Breast cancer Paternal Aunt    Uterine cancer Mother        probable   Lung cancer Brother        metastatic disease; unsure if lung was pimary   Breast cancer Paternal 70    ALS  Daughter    Breast cancer Cousin        dx 36s   Breast cancer Cousin        dx 23s   Breast cancer Cousin        dx 26s   Lung cancer Cousin     ALLERGIES:  has No Known Allergies.  MEDICATIONS:  Current Outpatient Medications  Medication Sig Dispense Refill   acetaminophen (TYLENOL) 325 MG tablet Take 650 mg by mouth every 6 (six) hours as needed for moderate pain.      atorvastatin (LIPITOR) 40 MG tablet      brimonidine-timolol (COMBIGAN) 0.2-0.5 % ophthalmic solution Place 1 drop into the right eye in the morning and at bedtime.     calcium-vitamin D (OSCAL WITH D) 250-125 MG-UNIT tablet Take 1 tablet by mouth daily.     cetirizine (ZYRTEC) 10 MG tablet Take 10 mg by mouth every morning.      Cyanocobalamin (VITAMIN B 12 PO) Take  by mouth.     gabapentin (NEURONTIN) 300 MG capsule Take 1 capsule (300 mg total) by mouth 2 (two) times daily. 60 capsule 5   lisinopril-hydrochlorothiazide (ZESTORETIC) 20-12.5 MG tablet Take 1 tablet by mouth every morning.      metaxalone (SKELAXIN) 800 MG tablet May take 1/2 to 1 tablet up to 3 times daily as needed for pain or spasm.     omeprazole (PRILOSEC) 40 MG capsule Take 1 tablet by mouth every morning.      Semaglutide (OZEMPIC, 0.25 OR 0.5 MG/DOSE, [place]) Inject 0.25 mg into the skin once a week.      triamcinolone ointment (KENALOG) 0.5 % Apply 1 application topically 2 (two) times daily. 30 g 0   vitamin C (ASCORBIC ACID) 500 MG tablet Take 500 mg by mouth daily.     Vitamin D, Ergocalciferol, (DRISDOL) 1.25 MG (50000 UT) CAPS capsule Take 50,000 Units by mouth every 7 (seven) days.      anastrozole (ARIMIDEX) 1 MG tablet Take 1 tablet (1 mg total) by mouth daily. 90 tablet 1   No current facility-administered medications for this visit.     PHYSICAL EXAMINATION: ECOG PERFORMANCE STATUS: 1 - Symptomatic but completely ambulatory Vitals:   02/17/21 1501  BP: 111/70  Pulse: 68  Resp: 18  Temp: (!) 97 F (36.1 C)   Filed Weights    02/17/21 1501  Weight: 217 lb 12.8 oz (98.8 kg)    Physical Exam Constitutional:      General: She is not in acute distress. HENT:     Head: Normocephalic and atraumatic.  Eyes:     General: No scleral icterus. Cardiovascular:     Rate and Rhythm: Normal rate and regular rhythm.     Heart sounds: Normal heart sounds.  Pulmonary:     Effort: Pulmonary effort is normal. No respiratory distress.     Breath sounds: No wheezing.  Abdominal:     General: Bowel sounds are normal. There is no distension.     Palpations: Abdomen is soft.  Musculoskeletal:        General: No deformity. Normal range of motion.     Cervical back: Normal range of motion and neck supple.  Skin:    General: Skin is warm and dry.     Findings: No erythema or rash.  Neurological:     Mental Status: She is alert and oriented to person, place, and time. Mental status is at baseline.     Cranial Nerves: No cranial nerve deficit.     Coordination: Coordination normal.  Psychiatric:        Mood and Affect: Mood normal.     LABORATORY DATA:  I have reviewed the data as listed Lab Results  Component Value Date   WBC 7.9 02/17/2021   HGB 12.9 02/17/2021   HCT 38.4 02/17/2021   MCV 91.9 02/17/2021   PLT 257 02/17/2021   Recent Labs    07/01/20 1336 10/31/20 1235 02/17/21 1424  NA 141 137 138  K 4.2 4.1 4.3  CL 102 104 98  CO2 _0 GLUCOSE 86 136* 163*  BUN 28* 25* 28*  CREATININE 1.28* 1.09* 1.42*  CALCIUM 9.1 9.2 9.6  GFRNONAA 44* 53* 39*  PROT 7.0 6.6 7.0  ALBUMIN 4.0 3.9 3.9  AST _1 ALT _2 ALKPHOS 75 63 85  BILITOT 0.6 0.7 0.5    Iron/TIBC/Ferritin/ %Sat No results found for: IRON, TIBC, FERRITIN, IRONPCTSAT  RADIOGRAPHIC STUDIES: I have personally reviewed the radiological images as listed and agreed with the findings in the report. US RENAL  Result Date: 12/03/2020 CLINICAL DATA:  Stage III A chronic renal disease. EXAM: RENAL / URINARY TRACT ULTRASOUND  COMPLETE COMPARISON:  None. FINDINGS: Right Kidney: Renal measurements: 9.9 cm x 4.4 cm x 4.3 cm = volume: 96.8 mL. Echogenicity within normal limits. No mass or hydronephrosis visualized. Left Kidney: Renal measurements: 10.2 cm x 4.5 cm x 4.6 cm = volume: 109.1 mL. Echogenicity within normal limits. No mass or hydronephrosis visualized. Bladder: Appears normal for degree of bladder distention. Other: None. IMPRESSION: Normal renal ultrasound. Electronically Signed   By: Virgina Norfolk M.D.   On: 12/03/2020 20:47   MM DIAG BREAST W/IMPLANT TOMO BILATERAL  Result Date: 12/04/2020 CLINICAL DATA:  LEFT lumpectomy 2021 EXAM: DIGITAL DIAGNOSTIC BILATERAL MAMMOGRAM WITH IMPLANTS, CAD AND TOMOSYNTHESIS TECHNIQUE: Bilateral digital diagnostic mammography and breast tomosynthesis was performed. The images were evaluated with computer-aided detection. Standard and/or implant displaced views were performed. COMPARISON:  Previous exam(s). ACR Breast Density Category b: There are scattered areas of fibroglandular density. FINDINGS: There is density and architectural distortion within the LEFT breast, consistent with postsurgical changes. These are new in comparison to prior. The patient has prepectoral silicone implants. Focal convex contours bilaterally likely reflecting extracapsular rupture. No suspicious mass, distortion, or microcalcifications are identified to suggest presence of malignancy. IMPRESSION: No mammographic evidence of malignancy. RECOMMENDATION: Bilateral diagnostic mammogram in 1 year I have discussed the findings and recommendations with the patient. If applicable, a reminder letter will be sent to the patient regarding the next appointment. BI-RADS CATEGORY  2: Benign. Electronically Signed   By: Valentino Saxon M.D.   On: 12/04/2020 15:51    ASSESSMENT & PLAN:  1. Malignant neoplasm of upper-outer quadrant of left breast in female, estrogen receptor positive ([place])   2. Osteopenia,  unspecified location   3. Aromatase inhibitor use    Cancer Staging  Malignant neoplasm of upper-outer quadrant of left breast in female, estrogen receptor positive ([place]) Staging form: Breast, AJCC 8th Edition - Clinical: Stage IB (cT2, cN0, cM0, G2, ER+, PR+, HER2-) - Signed by Earlie Server, MD on 01/03/2020 - Pathologic stage from 02/26/2020: Stage IA (pT2, pN0, cM0, G2, ER+, PR+, HER2-, Oncotype DX score: 5) - Signed by Earlie Server, MD on 02/26/2020  Mammogram images and pathology were reviewed and discussed with patient.  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 s/p adjuvant radiation. Patient tolerates Arimidex with mild difficulties Labs reviewed and discussed with patient. Continue Arimidex  #Osteopenia,10/30/2019, DEXA showed osteopenia, 10-year risk of any major fracture 15%, 10-year risk of hip fracture 2.3%.  Continue calcium 1200 mg daily and vitamin D supplementation. Received Prolia on 10/31/2020.  Plan every 6 months.  She tolerates well.  All questions were answered. The patient knows to call the clinic with any problems questions or concerns.  cc Juluis Pitch, MD    Return of visit: 3 months 02/17/2021

## 2021-02-24 DIAGNOSIS — E559 Vitamin D deficiency, unspecified: Secondary | ICD-10-CM | POA: Diagnosis not present

## 2021-02-24 DIAGNOSIS — I10 Essential (primary) hypertension: Secondary | ICD-10-CM | POA: Diagnosis not present

## 2021-02-24 DIAGNOSIS — E119 Type 2 diabetes mellitus without complications: Secondary | ICD-10-CM | POA: Diagnosis not present

## 2021-02-24 DIAGNOSIS — E785 Hyperlipidemia, unspecified: Secondary | ICD-10-CM | POA: Diagnosis not present

## 2021-02-24 DIAGNOSIS — E538 Deficiency of other specified B group vitamins: Secondary | ICD-10-CM | POA: Diagnosis not present

## 2021-02-25 DIAGNOSIS — I10 Essential (primary) hypertension: Secondary | ICD-10-CM | POA: Diagnosis not present

## 2021-02-25 DIAGNOSIS — C50912 Malignant neoplasm of unspecified site of left female breast: Secondary | ICD-10-CM | POA: Diagnosis not present

## 2021-02-25 DIAGNOSIS — Z170 Estrogen receptor positive status [ER+]: Secondary | ICD-10-CM | POA: Diagnosis not present

## 2021-02-25 DIAGNOSIS — G8929 Other chronic pain: Secondary | ICD-10-CM | POA: Diagnosis not present

## 2021-02-25 DIAGNOSIS — E785 Hyperlipidemia, unspecified: Secondary | ICD-10-CM | POA: Diagnosis not present

## 2021-02-25 DIAGNOSIS — M545 Low back pain, unspecified: Secondary | ICD-10-CM | POA: Diagnosis not present

## 2021-02-25 DIAGNOSIS — E119 Type 2 diabetes mellitus without complications: Secondary | ICD-10-CM | POA: Diagnosis not present

## 2021-02-25 DIAGNOSIS — M8580 Other specified disorders of bone density and structure, unspecified site: Secondary | ICD-10-CM | POA: Diagnosis not present

## 2021-03-05 ENCOUNTER — Encounter: Payer: Self-pay | Admitting: Student in an Organized Health Care Education/Training Program

## 2021-03-05 ENCOUNTER — Other Ambulatory Visit: Payer: Self-pay

## 2021-03-05 ENCOUNTER — Ambulatory Visit
Payer: Medicare HMO | Attending: Student in an Organized Health Care Education/Training Program | Admitting: Student in an Organized Health Care Education/Training Program

## 2021-03-05 DIAGNOSIS — M47816 Spondylosis without myelopathy or radiculopathy, lumbar region: Secondary | ICD-10-CM | POA: Diagnosis not present

## 2021-03-05 DIAGNOSIS — G8929 Other chronic pain: Secondary | ICD-10-CM | POA: Diagnosis not present

## 2021-03-05 DIAGNOSIS — M51369 Other intervertebral disc degeneration, lumbar region without mention of lumbar back pain or lower extremity pain: Secondary | ICD-10-CM

## 2021-03-05 DIAGNOSIS — G894 Chronic pain syndrome: Secondary | ICD-10-CM | POA: Diagnosis not present

## 2021-03-05 DIAGNOSIS — Z9889 Other specified postprocedural states: Secondary | ICD-10-CM

## 2021-03-05 DIAGNOSIS — M546 Pain in thoracic spine: Secondary | ICD-10-CM | POA: Diagnosis not present

## 2021-03-05 DIAGNOSIS — M5136 Other intervertebral disc degeneration, lumbar region: Secondary | ICD-10-CM | POA: Diagnosis not present

## 2021-03-05 MED ORDER — GABAPENTIN 300 MG PO CAPS
300.0000 mg | ORAL_CAPSULE | Freq: Two times a day (BID) | ORAL | 5 refills | Status: DC
Start: 2021-03-05 — End: 2021-03-05

## 2021-03-05 MED ORDER — GABAPENTIN 300 MG PO CAPS: 300.0000 mg | ORAL_CAPSULE | Freq: Two times a day (BID) | ORAL | 5 refills | Status: DC

## 2021-03-05 MED ORDER — GABAPENTIN 300 MG PO CAPS: 300.0000 mg | ORAL_CAPSULE | Freq: Three times a day (TID) | ORAL | 5 refills | Status: DC

## 2021-03-05 NOTE — Progress Notes (Signed)
Patient: Tonya Bowen  Service Category: E/M  Provider: Gillis Santa, MD  DOB: 1945-12-27  DOS: 03/05/2021  Location: Office  MRN: [id number]  Setting: Ambulatory outpatient  Referring Provider: Juluis Pitch, MD  Type: Established Patient  Specialty: Interventional Pain Management  PCP: Juluis Pitch, MD  Location: Remote location  Delivery: TeleHealth     Virtual Encounter - Pain Management PROVIDER NOTE: Information contained herein reflects review and annotations entered in association with encounter. Interpretation of such information and data should be left to medically-trained personnel. Information provided to patient can be located elsewhere in the medical record under "Patient Instructions". Document created using STT-dictation technology, any transcriptional errors that may result from process are unintentional.    Contact & Pharmacy Preferred: [phone] Home: [phone] (home) Mobile: [phone] (mobile) E-mail: [email or web address]  [id number] [place] [address]: [phone]: [phone] [place] [address] [facility]Phone: [phone]: [phone]  Pre-screening  Tonya Bowen "in-person" vs "virtual" encounter. She indicated preferring virtual for this encounter.   Reason COVID-19*   Social distancing based on CDC and AMA recommendations.   I contacted Tonya Bowen 03/05/2021 via telephone.      I clearly identified myself as BGillis Santa MD. I verified that I was speaking with the correct person using two identifiers (Name: Tonya Bowen and date of birth: 707/16/1947.  Consent I sought verbal advanced consent from Tonya Batonfor virtual visit interactions. I informed Ms. SStanhopeof possible security and privacy concerns, risks, and limitations associated with providing  "not-in-person" medical evaluation and management services. I also informed [name] the availability of "in-person" appointments. Finally, I informed her that there would be a charge for the virtual visit and that she could be  personally, fully or partially, financially responsible for it. Ms. SAmorosoexpressed understanding and agreed to proceed.   Historic Elements   Ms. JCLAIRESSA BOULETis a 75y.o. year old, female patient evaluated today after our last contact on Visit date not found. Tonya Bowen has a past medical history of Arthritis, [place]), Chronic back pain, Complication of anesthesia, Diabetes mellitus without complication ([place], Family history of breast cancer, Family history of lung cancer, Family history of uterine cancer, GERD (gastroesophageal reflux disease), Hypertension, Prediabetes, and Stomach ulcer. She also  has a past surgical history that includes Abdominal hysterectomy; Tonsillectomy; Bunionectomy (Bilateral); Breast enhancement surgery; Cataract extraction (Left); Radial keratotomy; colonoscopy; Upper gi endoscopy; Kyphoplasty (N/A, 02/06/2014); Augmentation mammaplasty (Bilateral, 1987); Breast lumpectomy with sentinel lymph node bx (Left, 02/04/2020); and Breast lumpectomy (Left, 01/2020). Ms. SForgethas a current medication list which includes the following prescription(s): acetaminophen, anastrozole, atorvastatin, combigan, calcium-vitamin d, cetirizine, cyanocobalamin, lisinopril-hydrochlorothiazide, omeprazole, semaglutide, vitamin c, vitamin d (ergocalciferol), and gabapentin. She  reports that she has never smoked. She has never used smokeless tobacco. She reports current alcohol use of about 2.0 standard drinks per week. She reports that she does not use drugs. Tonya Bowen No Known Allergies.   HPI  Today, she is being contacted for medication management.  Unfortunately, patient is not feeling well.  She she has a viral illness and thinks it may be the  flu. She is having increased pain and we discussed increasing her gabapentin to 300 mg 3 times a day from  twice daily.  Risks and benefits reviewed.  Laboratory Chemistry Profile   Renal Lab Results  Component Value Date   BUN 28 (H) 02/17/2021   CREATININE 1.42 (H) 02/17/2021   GFRAA >90 01/30/2014   GFRNONAA 39 (L) 02/17/2021    Hepatic Lab Results  Component Value Date   AST 16 02/17/2021   ALT 13 02/17/2021   ALBUMIN 3.9 02/17/2021   ALKPHOS 85 02/17/2021    Electrolytes Lab Results  Component Value Date   NA 138 02/17/2021   K 4.3 02/17/2021   CL 98 02/17/2021   CALCIUM 9.6 02/17/2021    Bone No results found for: VD25OH, VD125OH2TOT, QR9758IT2, PQ9826EB5, 25OHVITD1, 25OHVITD2, 25OHVITD3, TESTOFREE, TESTOSTERONE  Inflammation (CRP: Acute Phase) (ESR: Chronic Phase) No results found for: CRP, ESRSEDRATE, LATICACIDVEN       Note: Above Lab results reviewed.  Imaging  MM DIAG BREAST W/IMPLANT TOMO BILATERAL CLINICAL DATA:  LEFT lumpectomy 2021  EXAM: DIGITAL DIAGNOSTIC BILATERAL MAMMOGRAM WITH IMPLANTS, CAD AND TOMOSYNTHESIS  TECHNIQUE: Bilateral digital diagnostic mammography and breast tomosynthesis was performed. The images were evaluated with computer-aided detection. Standard and/or implant displaced views were performed.  COMPARISON:  Previous exam(s).  ACR Breast Density Category b: There are scattered areas of fibroglandular density.  FINDINGS: There is density and architectural distortion within the LEFT breast, consistent with postsurgical changes. These are new in comparison to prior. The patient has prepectoral silicone implants. Focal convex contours bilaterally likely reflecting extracapsular rupture. No suspicious mass, distortion, or microcalcifications are identified to suggest presence of malignancy.  IMPRESSION: No mammographic evidence of malignancy.  RECOMMENDATION: Bilateral diagnostic mammogram in 1 year  I have discussed the  findings and recommendations with the patient. If applicable, a reminder letter will be sent to the patient regarding the next appointment.  BI-RADS CATEGORY  2: Benign.  Electronically Signed   By: Valentino Saxon M.D.   On: 12/04/2020 15:51  Assessment  The primary encounter diagnosis was S/P kyphoplasty (L2 done in 2015 with Dr Rolena Infante). Diagnoses of Lumbar degenerative disc disease, Lumbar facet arthropathy, Chronic midline thoracic back pain, and Chronic pain syndrome were also pertinent to this visit.  Plan of Care    Ms. RICKI VANHANDEL has a current medication list which includes the following long-term medication(s): atorvastatin, calcium-vitamin d, cetirizine, lisinopril-hydrochlorothiazide, omeprazole, and gabapentin.  Pharmacotherapy (Medications Ordered): Meds ordered this encounter  Medications   DISCONTD: gabapentin (NEURONTIN) 300 MG capsule    Sig: Take 1 capsule (300 mg total) by mouth 2 (two) times daily.    Dispense:  60 capsule    Refill:  5   DISCONTD: gabapentin (NEURONTIN) 300 MG capsule    Sig: Take 1 capsule (300 mg total) by mouth 2 (two) times daily.    Dispense:  60 capsule    Refill:  5   gabapentin (NEURONTIN) 300 MG capsule    Sig: Take 1 capsule (300 mg total) by mouth 3 (three) times daily.    Dispense:  90 capsule    Refill:  5    Follow-up plan:   Return in about 6 months (around 09/03/2021) for Medication Management, virtual.    Recent Visits No visits were found meeting these conditions. Showing recent visits within past 90 days and meeting all other requirements Today's Visits Date Type Provider Dept  03/05/21 Telemedicine Gillis Santa, MD [facility]  Showing today's visits and meeting all other requirements Future Appointments No visits were found meeting these conditions. Showing future appointments within next 90  days and meeting all other requirements  I discussed the assessment and treatment plan with the patient.  The patient was provided an opportunity to ask questions and all were answered. The patient agreed with the plan and demonstrated an understanding of the instructions.  Patient advised to call back or seek an in-person evaluation if the symptoms or condition worsens.  Duration of encounter: 21mnutes.  Note by: BGillis Santa MD Date: 03/05/2021; Time: 11:50 AM

## 2021-03-07 ENCOUNTER — Telehealth: Payer: Medicare HMO | Admitting: Family Medicine

## 2021-03-07 DIAGNOSIS — R0981 Nasal congestion: Secondary | ICD-10-CM

## 2021-03-13 ENCOUNTER — Encounter: Payer: Self-pay | Admitting: Family Medicine

## 2021-03-17 NOTE — Progress Notes (Signed)
Duration of communication with patient delayed by several days post submitting visit.   Miller - visit closed

## 2021-03-26 ENCOUNTER — Other Ambulatory Visit: Payer: Self-pay | Admitting: Student in an Organized Health Care Education/Training Program

## 2021-03-27 DIAGNOSIS — M545 Low back pain, unspecified: Secondary | ICD-10-CM | POA: Diagnosis not present

## 2021-04-01 DIAGNOSIS — M545 Low back pain, unspecified: Secondary | ICD-10-CM | POA: Diagnosis not present

## 2021-04-03 DIAGNOSIS — G629 Polyneuropathy, unspecified: Secondary | ICD-10-CM | POA: Diagnosis not present

## 2021-04-03 DIAGNOSIS — H02401 Unspecified ptosis of right eyelid: Secondary | ICD-10-CM | POA: Diagnosis not present

## 2021-04-03 DIAGNOSIS — N183 Chronic kidney disease, stage 3 unspecified: Secondary | ICD-10-CM | POA: Diagnosis not present

## 2021-04-03 DIAGNOSIS — R413 Other amnesia: Secondary | ICD-10-CM | POA: Diagnosis not present

## 2021-04-03 DIAGNOSIS — M545 Low back pain, unspecified: Secondary | ICD-10-CM | POA: Diagnosis not present

## 2021-04-03 DIAGNOSIS — E119 Type 2 diabetes mellitus without complications: Secondary | ICD-10-CM | POA: Diagnosis not present

## 2021-04-03 DIAGNOSIS — R2689 Other abnormalities of gait and mobility: Secondary | ICD-10-CM | POA: Diagnosis not present

## 2021-04-06 ENCOUNTER — Other Ambulatory Visit: Payer: Self-pay | Admitting: Orthopedic Surgery

## 2021-04-06 DIAGNOSIS — M5440 Lumbago with sciatica, unspecified side: Secondary | ICD-10-CM

## 2021-05-04 DIAGNOSIS — R202 Paresthesia of skin: Secondary | ICD-10-CM | POA: Diagnosis not present

## 2021-05-04 DIAGNOSIS — R413 Other amnesia: Secondary | ICD-10-CM | POA: Diagnosis not present

## 2021-05-04 DIAGNOSIS — N183 Chronic kidney disease, stage 3 unspecified: Secondary | ICD-10-CM | POA: Diagnosis not present

## 2021-05-04 DIAGNOSIS — H02401 Unspecified ptosis of right eyelid: Secondary | ICD-10-CM | POA: Diagnosis not present

## 2021-05-04 DIAGNOSIS — R2689 Other abnormalities of gait and mobility: Secondary | ICD-10-CM | POA: Diagnosis not present

## 2021-05-13 ENCOUNTER — Other Ambulatory Visit: Payer: Self-pay | Admitting: *Deleted

## 2021-05-13 DIAGNOSIS — Z170 Estrogen receptor positive status [ER+]: Secondary | ICD-10-CM

## 2021-05-13 DIAGNOSIS — C50412 Malignant neoplasm of upper-outer quadrant of left female breast: Secondary | ICD-10-CM

## 2021-05-18 ENCOUNTER — Other Ambulatory Visit: Payer: Self-pay | Admitting: General Surgery

## 2021-05-18 DIAGNOSIS — Z170 Estrogen receptor positive status [ER+]: Secondary | ICD-10-CM

## 2021-05-18 DIAGNOSIS — C50412 Malignant neoplasm of upper-outer quadrant of left female breast: Secondary | ICD-10-CM

## 2021-05-19 ENCOUNTER — Inpatient Hospital Stay: Payer: Medicare HMO | Attending: Oncology

## 2021-05-19 ENCOUNTER — Inpatient Hospital Stay: Payer: Medicare HMO

## 2021-05-19 ENCOUNTER — Inpatient Hospital Stay: Payer: Medicare HMO | Admitting: Oncology

## 2021-05-22 DIAGNOSIS — M545 Low back pain, unspecified: Secondary | ICD-10-CM | POA: Diagnosis not present

## 2021-05-25 ENCOUNTER — Encounter: Payer: Self-pay | Admitting: Radiation Oncology

## 2021-05-25 ENCOUNTER — Other Ambulatory Visit: Payer: Self-pay

## 2021-05-25 ENCOUNTER — Ambulatory Visit
Admission: RE | Admit: 2021-05-25 | Discharge: 2021-05-25 | Disposition: A | Discharge status: Home / Self Care | Payer: Medicare HMO | Source: Ambulatory Visit | Attending: Radiation Oncology | Admitting: Radiation Oncology

## 2021-05-25 VITALS — BP 90/76 | HR 73 | Temp 97.0°F | Resp 18 | Ht 64.5 in | Wt 205.1 lb

## 2021-05-25 DIAGNOSIS — Z923 Personal history of irradiation: Secondary | ICD-10-CM | POA: Diagnosis not present

## 2021-05-25 DIAGNOSIS — H3091 Unspecified chorioretinal inflammation, right eye: Secondary | ICD-10-CM | POA: Diagnosis not present

## 2021-05-25 DIAGNOSIS — Z79811 Long term (current) use of aromatase inhibitors: Secondary | ICD-10-CM | POA: Insufficient documentation

## 2021-05-25 DIAGNOSIS — Z08 Encounter for follow-up examination after completed treatment for malignant neoplasm: Secondary | ICD-10-CM | POA: Diagnosis not present

## 2021-05-25 DIAGNOSIS — C50412 Malignant neoplasm of upper-outer quadrant of left female breast: Secondary | ICD-10-CM | POA: Insufficient documentation

## 2021-05-25 DIAGNOSIS — Z853 Personal history of malignant neoplasm of breast: Secondary | ICD-10-CM | POA: Diagnosis not present

## 2021-05-25 DIAGNOSIS — H35351 Cystoid macular degeneration, right eye: Secondary | ICD-10-CM | POA: Diagnosis not present

## 2021-05-25 DIAGNOSIS — Z170 Estrogen receptor positive status [ER+]: Secondary | ICD-10-CM

## 2021-05-25 NOTE — Progress Notes (Signed)
Radiation Oncology ?Follow up Note ? ?Name: Tonya Bowen   ?Date:   05/25/2021 ?MRN:  [id number] ?DOB: 12/21/1945  ? ? ?This 76 y.o. female presents to the clinic today for 1 year follow-up status post whole breast radiation to her left breast for stage Ib (T2 N0 M0) ER/PR positive invasive mammary carcinoma. ? ?REFERRING PROVIDER: Juluis Pitch, MD ? ?HPI: Patient is a 76 year old female now out six 1 year having completed whole breast radiation to her left breast for stage Ib ER/PR positive invasive mammary carcinoma.  Seen today in routine follow-up she is doing well she states she still has some firmness in that part of her breast although she does have a detectable seroma there.  She otherwise specifically denies breast tenderness cough or bone pain..  She had mammograms back in September which I have reviewed were BI-RADS 2 benign.  She is currently on Arimidex tolerating that well without side effect. ? ?COMPLICATIONS OF TREATMENT: none ? ?FOLLOW UP COMPLIANCE: keeps appointments  ? ?PHYSICAL EXAM:  ?BP 90/76   Pulse 73   Temp (!) 97 ?F (36.1 ?C)   Resp 18   Ht 5' 4.5" (1.638 m)   Wt 205 lb 1.6 oz (93 kg)   BMI 34.66 kg/m?  ?Left breast has a seroma present in the lateral aspect of the breast.  This is unchanged over time.  No other dominant masses noted in either breast.  No axillary or supraclavicular adenopathy is identified.  Well-developed well-nourished patient in NAD. HEENT reveals PERLA, EOMI, discs not visualized.  Oral cavity is clear. No oral mucosal lesions are identified. Neck is clear without evidence of cervical or supraclavicular adenopathy. Lungs are clear to A&P. Cardiac examination is essentially unremarkable with regular rate and rhythm without murmur rub or thrill. Abdomen is benign with no organomegaly or masses noted. Motor sensory and DTR levels are equal and symmetric in the upper and lower extremities. Cranial nerves II through XII are grossly intact. Proprioception is intact.  No peripheral adenopathy or edema is identified. No motor or sensory levels are noted. Crude visual fields are within normal range. ? ?RADIOLOGY RESULTS: Mammograms reviewed compatible with above-stated findings ? ?PLAN: Present time she is now at a year with no evidence of disease.  On pleased with her overall progress.  I have asked to see her back in 1 year for follow-up.  She continues on Arimidex without side effect.  Patient knows to call with any concerns. ? ?I would like to take this opportunity to thank you for allowing me to participate in the care of your patient.. ?  ? Noreene Filbert, MD ? ?

## 2021-06-16 ENCOUNTER — Other Ambulatory Visit: Payer: Self-pay

## 2021-06-16 ENCOUNTER — Inpatient Hospital Stay: Payer: Medicare HMO | Admitting: Oncology

## 2021-06-16 ENCOUNTER — Encounter: Payer: Self-pay | Admitting: Oncology

## 2021-06-16 ENCOUNTER — Inpatient Hospital Stay: Payer: Medicare HMO

## 2021-06-16 ENCOUNTER — Other Ambulatory Visit: Payer: Self-pay | Admitting: Family Medicine

## 2021-06-16 ENCOUNTER — Inpatient Hospital Stay: Payer: Medicare HMO | Attending: Oncology

## 2021-06-16 VITALS — BP 101/61 | HR 76 | Temp 98.7°F | Resp 19 | Wt 204.7 lb

## 2021-06-16 DIAGNOSIS — Z170 Estrogen receptor positive status [ER+]: Secondary | ICD-10-CM | POA: Insufficient documentation

## 2021-06-16 DIAGNOSIS — Z79811 Long term (current) use of aromatase inhibitors: Secondary | ICD-10-CM | POA: Insufficient documentation

## 2021-06-16 DIAGNOSIS — C50412 Malignant neoplasm of upper-outer quadrant of left female breast: Secondary | ICD-10-CM | POA: Insufficient documentation

## 2021-06-16 DIAGNOSIS — M8580 Other specified disorders of bone density and structure, unspecified site: Secondary | ICD-10-CM

## 2021-06-16 DIAGNOSIS — N6320 Unspecified lump in the left breast, unspecified quadrant: Secondary | ICD-10-CM

## 2021-06-16 LAB — CBC WITH DIFFERENTIAL/PLATELET
Abs Immature Granulocytes: 0.02 10*3/uL (ref 0.00–0.07)
Basophils Absolute: 0 10*3/uL (ref 0.0–0.1)
Basophils Relative: 1 %
Eosinophils Absolute: 0.3 10*3/uL (ref 0.0–0.5)
Eosinophils Relative: 4 %
HCT: 40.1 % (ref 36.0–46.0)
Hemoglobin: 13.3 g/dL (ref 12.0–15.0)
Immature Granulocytes: 0 %
Lymphocytes Relative: 15 %
Lymphs Abs: 1.2 10*3/uL (ref 0.7–4.0)
MCH: 30.2 pg (ref 26.0–34.0)
MCHC: 33.2 g/dL (ref 30.0–36.0)
MCV: 90.9 fL (ref 80.0–100.0)
Monocytes Absolute: 0.5 10*3/uL (ref 0.1–1.0)
Monocytes Relative: 6 %
Neutro Abs: 6 10*3/uL (ref 1.7–7.7)
Neutrophils Relative %: 74 %
Platelets: 306 10*3/uL (ref 150–400)
RBC: 4.41 MIL/uL (ref 3.87–5.11)
RDW: 12.7 % (ref 11.5–15.5)
WBC: 8.1 10*3/uL (ref 4.0–10.5)
nRBC: 0 % (ref 0.0–0.2)

## 2021-06-16 LAB — COMPREHENSIVE METABOLIC PANEL
ALT: 11 U/L (ref 0–44)
AST: 18 U/L (ref 15–41)
Albumin: 3.8 g/dL (ref 3.5–5.0)
Alkaline Phosphatase: 72 U/L (ref 38–126)
Anion gap: 7 (ref 5–15)
BUN: 21 mg/dL (ref 8–23)
CO2: 29 mmol/L (ref 22–32)
Calcium: 9.2 mg/dL (ref 8.9–10.3)
Chloride: 101 mmol/L (ref 98–111)
Creatinine, Ser: 1.12 mg/dL — ABNORMAL HIGH (ref 0.44–1.00)
GFR, Estimated: 52 mL/min — ABNORMAL LOW (ref >60)
Glucose, Bld: 110 mg/dL — ABNORMAL HIGH (ref 70–99)
Potassium: 3.6 mmol/L (ref 3.5–5.1)
Sodium: 137 mmol/L (ref 135–145)
Total Bilirubin: 0.6 mg/dL (ref 0.3–1.2)
Total Protein: 6.9 g/dL (ref 6.5–8.1)

## 2021-06-16 MED ORDER — DENOSUMAB 60 MG/ML ~~LOC~~ SOSY
60.0000 mg | PREFILLED_SYRINGE | Freq: Once | SUBCUTANEOUS | Status: AC
  Administered 2021-06-16: 60 mg via SUBCUTANEOUS
  Filled 2021-06-16: qty 1

## 2021-06-16 NOTE — Progress Notes (Signed)
?Hematology/Oncology Progress note ?[phone] [phone] ?  ? ? ? ?Patient Care Team: ?Juluis Pitch, MD as PCP - General (Family Medicine) ?Rico Junker, RN as Registered Nurse ?Robert Bellow, MD as Consulting Physician (General Surgery) ?Noreene Filbert, MD as Referring Physician (Radiation Oncology) ?Earlie Server, MD as Consulting Physician (Oncology) ? ?REFERRING PROVIDER: ?Juluis Pitch, MD  ?CHIEF COMPLAINTS/REASON FOR VISIT:  ?Follow-up for breast cancer management ? ?HISTORY OF PRESENTING ILLNESS:  ? ?Tonya Bowen is a  76 y.o.  female with PMH listed below was seen in consultation at the request of  Juluis Pitch, MD  for evaluation of breast cancer ? ?12/04/2019 Screening Mammogram showed left breast mass.  ?12/19/2019 Unilateral left breast diagnostic mammogram showed  ?6m irregular left upper outer breast mass, no left axillary lymphadenopathy.  ?12/27/2019 She underwent biopsy of left breast mass, pathology showed invasive mammary carcinoma, with mucinous features. Grade 2, no DCIS, no LVI, ER 90%+, PR 90%+, HER2 - ? ?Patient was referred to establish care and discuss management plan.  ?Family history of breast cancer: paternal aunt x 2 breast cancer ?Family history of other cancers: brother lung cancer, mother possible uterine cancer ? ?Menarche: 89?Menopause: hystectomy in 459s?Age at first live childbirth:23 ?Used OCP: about 10 years ?Used estrogen and progesterone therapy: 20+ years HRT ?History of Radiation to the chest: none ?Previous of breast biopsy: none ?       History of breast augmentation   ? ?# 02/04/2020, patient has underwent left lumpectomy with sentinel lymph node biopsy. 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  DCIS, negative margin. ?She also had atypical lobular hyperplasia.  3 sentinel lymph nodes excision all negative. ?pT2 N0. Oncotype DX recurrence score 5.  No adjuvant chemotherapy was  offered. ? ?#Family history of breast cancer, Genetic testing was done and was negative. ?#04/29/2020, finished adjuvant radiation therapy ?#05/14/2020, started on Arimidex. ? ?She has balance difficulties and she was recently seen by neurology Dr. SManuella Ghazion 06/04/2020.  Patient had work-up including SPEP and immunofixation.  Labs reviewed.  No M protein was identified. ? ?INTERVAL HISTORY ?Tonya BRESHEARSis a 76y.o. female who has above history reviewed by me today presents for follow up visit for management of left breast cancer, pT2N0 , ER/PR positive, HER-2 negative. ?Patient takes anastrozole 1 mg daily.  Overall tolerates well, manageable side effects. ?She felt a " hard spot" of her left breast ? ? ?Review of Systems  ?Constitutional:  Negative for appetite change, chills, fatigue and fever.  ?HENT:   Negative for hearing loss and voice change.   ?Eyes:  Negative for eye problems.  ?Respiratory:  Negative for chest tightness and cough.   ?Cardiovascular:  Negative for chest pain.  ?Gastrointestinal:  Negative for abdominal distention, abdominal pain and blood in stool.  ?Endocrine: Negative for hot flashes.  ?Genitourinary:  Negative for difficulty urinating and frequency.   ?Musculoskeletal:  Positive for arthralgias and back pain.  ?Skin:  Negative for itching and rash.  ?Neurological:  Negative for extremity weakness.  ?Hematological:  Negative for adenopathy.  ?Psychiatric/Behavioral:  Negative for confusion.   ? ?MEDICAL HISTORY:  ?Past Medical History:  ?Diagnosis Date  ? Arthritis   ? [place]  ? left breast  ? Chronic back pain   ? Complication of anesthesia   ? pt states she felt every thing during breast augmentation surgery  ? Diabetes mellitus without complication ([place]   ? Family history of  breast cancer   ? Family history of lung cancer   ? Family history of uterine cancer   ? GERD (gastroesophageal reflux disease)   ? Hypertension   ? Prediabetes   ? Stomach ulcer   ? ? ?SURGICAL  HISTORY: ?Past Surgical History:  ?Procedure Laterality Date  ? ABDOMINAL HYSTERECTOMY    ? AUGMENTATION MAMMAPLASTY Bilateral 1987  ? BREAST ENHANCEMENT SURGERY    ? BREAST LUMPECTOMY Left 01/2020  ? 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  ? BREAST LUMPECTOMY WITH SENTINEL LYMPH NODE BIOPSY Left 02/04/2020  ? Procedure: BREAST LUMPECTOMY WITH SENTINEL LYMPH NODE BX;  Surgeon: Robert Bellow, MD;  Location: [facility];  Service: General;  Laterality: Left;  ? BUNIONECTOMY Bilateral   ? Great toes  ? CATARACT EXTRACTION Left   ? colonoscopy    ? KYPHOPLASTY N/A 02/06/2014  ? Procedure:  L2 KYPHOPLASTY;  Surgeon: Melina Schools, MD;  Location: [facility];  Service: Orthopedics;  Laterality: N/A;  ? RADIAL KERATOTOMY    ? TONSILLECTOMY    ? UPPER GI ENDOSCOPY    ? ? ?SOCIAL HISTORY: ?Social History  ? ?Socioeconomic History  ? Marital status: Divorced  ?  Spouse name: Not on file  ? Number of children: Not on file  ? Years of education: Not on file  ? Highest education level: Not on file  ?Occupational History  ? Not on file  ?Tobacco Use  ? Smoking status: Never  ? Smokeless tobacco: Never  ?Vaping Use  ? Vaping Use: Never used  ?Substance and Sexual Activity  ? Alcohol use: Yes  ?  Alcohol/week: 2.0 standard drinks  ?  Types: 2 Glasses of wine per week  ?  Comment: socially  ? Drug use: No  ? Sexual activity: Not Currently  ?Other Topics Concern  ? Not on file  ?Social History Narrative  ? Not on file  ? ?Social Determinants of Health  ? ?Financial Resource Strain: Not on file  ?Food Insecurity: Not on file  ?Transportation Needs: Not on file  ?Physical Activity: Not on file  ?Stress: Not on file  ?Social Connections: Not on file  ?Intimate Partner Violence: Not on file  ? ? ?FAMILY HISTORY: ?Family History  ?Problem Relation Age of Onset  ? Breast cancer Paternal Aunt   ? Breast cancer Paternal Aunt   ? Uterine cancer Mother   ?     probable  ? Lung cancer Brother   ?     metastatic disease; unsure  if lung was pimary  ? Breast cancer Paternal Aunt   ? ALS Daughter   ? Breast cancer Cousin   ?     dx 14s  ? Breast cancer Cousin   ?     dx 47s  ? Breast cancer Cousin   ?     dx 42s  ? Lung cancer Cousin   ? ? ?ALLERGIES:  has No Known Allergies. ? ?MEDICATIONS:  ?Current Outpatient Medications  ?Medication Sig Dispense Refill  ? acetaminophen (TYLENOL) 325 MG tablet Take 650 mg by mouth every 6 (six) hours as needed for moderate pain.     ? anastrozole (ARIMIDEX) 1 MG tablet Take 1 tablet (1 mg total) by mouth daily. 90 tablet 1  ? atorvastatin (LIPITOR) 40 MG tablet     ? brimonidine-timolol (COMBIGAN) 0.2-0.5 % ophthalmic solution Place 1 drop into the right eye in the morning and at bedtime.    ? calcium-vitamin D (OSCAL WITH D) 250-125  MG-UNIT tablet Take 1 tablet by mouth daily.    ? cetirizine (ZYRTEC) 10 MG tablet Take 10 mg by mouth every morning.     ? Cyanocobalamin (VITAMIN B 12 PO) Take by mouth.    ? gabapentin (NEURONTIN) 300 MG capsule Take 1 capsule (300 mg total) by mouth 3 (three) times daily. 90 capsule 5  ? lisinopril-hydrochlorothiazide (ZESTORETIC) 20-12.5 MG tablet Take 1 tablet by mouth every morning.     ? omeprazole (PRILOSEC) 40 MG capsule Take 1 tablet by mouth every morning.     ? Semaglutide (OZEMPIC, 0.25 OR 0.5 MG/DOSE, [place]) Inject 0.25 mg into the skin once a week.     ? vitamin C (ASCORBIC ACID) 500 MG tablet Take 500 mg by mouth daily.    ? Vitamin D, Ergocalciferol, (DRISDOL) 1.25 MG (50000 UT) CAPS capsule Take 50,000 Units by mouth every 7 (seven) days.     ? ?No current facility-administered medications for this visit.  ? ? ? ?PHYSICAL EXAMINATION: ?ECOG PERFORMANCE STATUS: 1 - Symptomatic but completely ambulatory ?Vitals:  ? 06/16/21 1423  ?BP: 101/61  ?Pulse: 76  ?Resp: 19  ?Temp: 98.7 ?F (37.1 ?C)  ?SpO2: 97%  ? ?Filed Weights  ? 06/16/21 1423  ?Weight: 204 lb 11.2 oz (92.9 kg)  ? ? ?Physical Exam ?Constitutional:   ?   General: She is not in acute distress. ?HENT:  ?    Head: Normocephalic and atraumatic.  ?Eyes:  ?   General: No scleral icterus. ?Cardiovascular:  ?   Rate and Rhythm: Normal rate and regular rhythm.  ?   Heart sounds: Normal heart sounds.  ?Pulmonary:  ?   Effort: Pulmona

## 2021-06-17 DIAGNOSIS — E119 Type 2 diabetes mellitus without complications: Secondary | ICD-10-CM | POA: Diagnosis not present

## 2021-06-17 DIAGNOSIS — N1831 Chronic kidney disease, stage 3a: Secondary | ICD-10-CM | POA: Diagnosis not present

## 2021-06-17 DIAGNOSIS — E1122 Type 2 diabetes mellitus with diabetic chronic kidney disease: Secondary | ICD-10-CM | POA: Diagnosis not present

## 2021-06-17 DIAGNOSIS — E7800 Pure hypercholesterolemia, unspecified: Secondary | ICD-10-CM | POA: Diagnosis not present

## 2021-06-17 DIAGNOSIS — E663 Overweight: Secondary | ICD-10-CM | POA: Diagnosis not present

## 2021-06-17 DIAGNOSIS — I10 Essential (primary) hypertension: Secondary | ICD-10-CM | POA: Diagnosis not present

## 2021-07-03 ENCOUNTER — Ambulatory Visit
Admission: RE | Admit: 2021-07-03 | Discharge: 2021-07-03 | Disposition: A | Discharge status: Home / Self Care | Payer: Medicare HMO | Source: Ambulatory Visit | Attending: Oncology | Admitting: Oncology

## 2021-07-03 DIAGNOSIS — Z170 Estrogen receptor positive status [ER+]: Secondary | ICD-10-CM | POA: Insufficient documentation

## 2021-07-03 DIAGNOSIS — R928 Other abnormal and inconclusive findings on diagnostic imaging of breast: Secondary | ICD-10-CM | POA: Diagnosis not present

## 2021-07-03 DIAGNOSIS — C50412 Malignant neoplasm of upper-outer quadrant of left female breast: Secondary | ICD-10-CM | POA: Diagnosis not present

## 2021-07-03 DIAGNOSIS — N6012 Diffuse cystic mastopathy of left breast: Secondary | ICD-10-CM | POA: Diagnosis not present

## 2021-07-03 DIAGNOSIS — L309 Dermatitis, unspecified: Secondary | ICD-10-CM | POA: Diagnosis not present

## 2021-07-20 ENCOUNTER — Telehealth: Payer: Self-pay

## 2021-07-20 ENCOUNTER — Other Ambulatory Visit: Payer: Self-pay

## 2021-07-20 DIAGNOSIS — C50912 Malignant neoplasm of unspecified site of left female breast: Secondary | ICD-10-CM

## 2021-07-20 DIAGNOSIS — H35351 Cystoid macular degeneration, right eye: Secondary | ICD-10-CM | POA: Diagnosis not present

## 2021-07-20 DIAGNOSIS — N6320 Unspecified lump in the left breast, unspecified quadrant: Secondary | ICD-10-CM

## 2021-07-20 NOTE — Telephone Encounter (Signed)
New order entered

## 2021-07-20 NOTE — Progress Notes (Signed)
New orders for bilat diag mammo w/ implants entered.  ?

## 2021-07-20 NOTE — Telephone Encounter (Signed)
-----   Message from Earlie Server, MD sent at 07/18/2021 10:05 PM EDT ----- ?Please obtain bilateral diagnostic mammogram in Sept 2023 Thanks  ?

## 2021-07-20 NOTE — Telephone Encounter (Signed)
Radiologist has discussed results and recommendations with pt.  ? ?Please schedule bilat diag mammo  Sept 2023 and inform pt of appt.  ?

## 2021-08-13 DIAGNOSIS — L218 Other seborrheic dermatitis: Secondary | ICD-10-CM | POA: Diagnosis not present

## 2021-08-13 DIAGNOSIS — L0390 Cellulitis, unspecified: Secondary | ICD-10-CM | POA: Diagnosis not present

## 2021-08-13 DIAGNOSIS — L2089 Other atopic dermatitis: Secondary | ICD-10-CM | POA: Diagnosis not present

## 2021-08-13 DIAGNOSIS — L608 Other nail disorders: Secondary | ICD-10-CM | POA: Diagnosis not present

## 2021-08-17 DIAGNOSIS — H353211 Exudative age-related macular degeneration, right eye, with active choroidal neovascularization: Secondary | ICD-10-CM | POA: Diagnosis not present

## 2021-08-17 DIAGNOSIS — H35351 Cystoid macular degeneration, right eye: Secondary | ICD-10-CM | POA: Diagnosis not present

## 2021-08-17 DIAGNOSIS — H3091 Unspecified chorioretinal inflammation, right eye: Secondary | ICD-10-CM | POA: Diagnosis not present

## 2021-08-24 DIAGNOSIS — E119 Type 2 diabetes mellitus without complications: Secondary | ICD-10-CM | POA: Diagnosis not present

## 2021-08-24 DIAGNOSIS — E785 Hyperlipidemia, unspecified: Secondary | ICD-10-CM | POA: Diagnosis not present

## 2021-08-31 DIAGNOSIS — E669 Obesity, unspecified: Secondary | ICD-10-CM | POA: Diagnosis not present

## 2021-08-31 DIAGNOSIS — C50919 Malignant neoplasm of unspecified site of unspecified female breast: Secondary | ICD-10-CM | POA: Diagnosis not present

## 2021-08-31 DIAGNOSIS — M545 Low back pain, unspecified: Secondary | ICD-10-CM | POA: Diagnosis not present

## 2021-08-31 DIAGNOSIS — M8580 Other specified disorders of bone density and structure, unspecified site: Secondary | ICD-10-CM | POA: Diagnosis not present

## 2021-08-31 DIAGNOSIS — E119 Type 2 diabetes mellitus without complications: Secondary | ICD-10-CM | POA: Diagnosis not present

## 2021-08-31 DIAGNOSIS — E785 Hyperlipidemia, unspecified: Secondary | ICD-10-CM | POA: Diagnosis not present

## 2021-08-31 DIAGNOSIS — Z1389 Encounter for screening for other disorder: Secondary | ICD-10-CM | POA: Diagnosis not present

## 2021-08-31 DIAGNOSIS — I10 Essential (primary) hypertension: Secondary | ICD-10-CM | POA: Diagnosis not present

## 2021-08-31 DIAGNOSIS — Z0000 Encounter for general adult medical examination without abnormal findings: Secondary | ICD-10-CM | POA: Diagnosis not present

## 2021-09-01 ENCOUNTER — Other Ambulatory Visit: Payer: Self-pay | Admitting: *Deleted

## 2021-09-01 ENCOUNTER — Ambulatory Visit (HOSPITAL_BASED_OUTPATIENT_CLINIC_OR_DEPARTMENT_OTHER): Payer: Medicare HMO | Admitting: Student in an Organized Health Care Education/Training Program

## 2021-09-01 ENCOUNTER — Telehealth: Payer: Self-pay | Admitting: Student in an Organized Health Care Education/Training Program

## 2021-09-01 DIAGNOSIS — Z9889 Other specified postprocedural states: Secondary | ICD-10-CM

## 2021-09-01 MED ORDER — GABAPENTIN 300 MG PO CAPS: 300.0000 mg | ORAL_CAPSULE | Freq: Three times a day (TID) | ORAL | 5 refills | Status: DC

## 2021-09-01 NOTE — Telephone Encounter (Signed)
Patient lvmail 08-31-21 at 2:47 stating she wants her meds moved to [facility] on [address].

## 2021-09-01 NOTE — Telephone Encounter (Addendum)
Patient was scheduled today for VV w/Dr. Holley Raring. Her gabapentin runs out today.

## 2021-09-03 DIAGNOSIS — H35351 Cystoid macular degeneration, right eye: Secondary | ICD-10-CM | POA: Diagnosis not present

## 2021-09-03 DIAGNOSIS — Z0100 Encounter for examination of eyes and vision without abnormal findings: Secondary | ICD-10-CM | POA: Diagnosis not present

## 2021-09-03 DIAGNOSIS — Z961 Presence of intraocular lens: Secondary | ICD-10-CM | POA: Diagnosis not present

## 2021-09-03 DIAGNOSIS — H40053 Ocular hypertension, bilateral: Secondary | ICD-10-CM | POA: Diagnosis not present

## 2021-09-10 ENCOUNTER — Encounter: Payer: Self-pay | Admitting: Student in an Organized Health Care Education/Training Program

## 2021-09-10 ENCOUNTER — Ambulatory Visit
Payer: Medicare HMO | Attending: Student in an Organized Health Care Education/Training Program | Admitting: Student in an Organized Health Care Education/Training Program

## 2021-09-10 DIAGNOSIS — G8929 Other chronic pain: Secondary | ICD-10-CM | POA: Diagnosis not present

## 2021-09-10 DIAGNOSIS — Z9889 Other specified postprocedural states: Secondary | ICD-10-CM | POA: Diagnosis not present

## 2021-09-10 DIAGNOSIS — M546 Pain in thoracic spine: Secondary | ICD-10-CM

## 2021-09-10 DIAGNOSIS — M5136 Other intervertebral disc degeneration, lumbar region: Secondary | ICD-10-CM

## 2021-09-10 DIAGNOSIS — M5134 Other intervertebral disc degeneration, thoracic region: Secondary | ICD-10-CM | POA: Diagnosis not present

## 2021-09-10 DIAGNOSIS — M47816 Spondylosis without myelopathy or radiculopathy, lumbar region: Secondary | ICD-10-CM | POA: Diagnosis not present

## 2021-09-10 DIAGNOSIS — G894 Chronic pain syndrome: Secondary | ICD-10-CM | POA: Diagnosis not present

## 2021-09-10 MED ORDER — DICLOFENAC SODIUM 75 MG PO TBEC: 75.0000 mg | DELAYED_RELEASE_TABLET | Freq: Every day | ORAL | 0 refills | Status: DC

## 2021-09-10 NOTE — Progress Notes (Signed)
Patient: Tonya Bowen  Service Category: E/M  Provider: Gillis Santa, MD  DOB: May 17, 1945  DOS: 09/10/2021  Location: Office  MRN: [id number]  Setting: Ambulatory outpatient  Referring Provider: Juluis Pitch, MD  Type: Established Patient  Specialty: Interventional Pain Management  PCP: Juluis Pitch, MD  Location: Remote location  Delivery: TeleHealth     Virtual Encounter - Pain Management PROVIDER NOTE: Information contained herein reflects review and annotations entered in association with encounter. Interpretation of such information and data should be left to medically-trained personnel. Information provided to patient can be located elsewhere in the medical record under "Patient Instructions". Document created using STT-dictation technology, any transcriptional errors that may result from process are unintentional.    Contact & Pharmacy Preferred: [phone] Home: [phone] (home) Mobile: [phone] (mobile) E-mail: [email or web address]  [id number] [place] - [id number]: [phone]: [address] [facility]Phone: [phone]: [phone] [place]- [address] [address]Phone: [phone]: [phone]  Pre-screening  Ms. SSanvilleoffered "in-person" vs "virtual" encounter. She indicated preferring virtual for this encounter.   Reason COVID-19*  Social distancing based on CDC and AMA recommendations.   I contacted JRondel Batonon 09/10/2021 via telephone.      I clearly identified myself as BGillis Santa MD. I verified that I was speaking with the correct person using two identifiers (Name: JAARIA Bowen and date of birth: [birth year].  Consent I sought verbal advanced  consent from JRondel Batonfor virtual visit interactions. I informed Ms. SBundrickof possible security and privacy concerns, risks, and limitations associated with providing "not-in-person" medical evaluation and management services. I also informed Ms. SBrookerof the availability of "in-person" appointments. Finally, I informed her that there would be a charge for the virtual visit and that she could be  personally, fully or partially, financially responsible for it. Ms. SBozardexpressed understanding and agreed to proceed.   Historic Elements   Ms. JLAMIYAH SCHLOTTERis a 76y.o. year old, female patient evaluated today after our last contact on 09/01/2021. [name] has a past medical history of Arthritis, [place]), Chronic back pain, Complication of anesthesia, Diabetes mellitus without complication ([place], Family history of breast cancer, Family history of lung cancer, Family history of uterine cancer, GERD (gastroesophageal reflux disease), Hypertension, Prediabetes, and Stomach ulcer. She also  has a past surgical history that includes Abdominal hysterectomy; Tonsillectomy; Bunionectomy (Bilateral); Breast enhancement surgery; Cataract extraction (Left); Radial keratotomy; colonoscopy; Upper gi endoscopy; Kyphoplasty (N/A, 02/06/2014); Augmentation mammaplasty (Bilateral, 1987); Breast lumpectomy with sentinel lymph node bx (Left, 02/04/2020); and Breast lumpectomy (Left, 01/2020). Ms. SPucciarellihas a current medication list which includes the following prescription(s): diclofenac, acetaminophen, anastrozole, atorvastatin, combigan, calcium-vitamin d, cetirizine, cyanocobalamin, gabapentin, lisinopril-hydrochlorothiazide, omeprazole, semaglutide, vitamin c, and vitamin d (ergocalciferol). She  reports that she has never smoked. She has never used smokeless tobacco. She reports current alcohol use of about 2.0 standard drinks of alcohol per week. She reports that she does not use drugs. Ms. SBelotehas No  Known Allergies.   HPI  Today, she is being contacted for worsening of previously known (established) problem  Patient presents for chronic middle and lower back pain related to  L2 compression fractures.  She continues her gabapentin but is starting to notice increased mid back pain.  Recommend short trial of diclofenac as below.  Creatinine within normal limits.  Also recommend home health physical therapy that could help with range of motion and paraspinal muscle strengthening.  Follow-up in 6 months.  Laboratory Chemistry Profile   Renal Lab Results  Component Value Date   BUN 21 06/16/2021   CREATININE 1.12 (H) 06/16/2021   GFRAA >90 01/30/2014   GFRNONAA 52 (L) 06/16/2021    Hepatic Lab Results  Component Value Date   AST 18 06/16/2021   ALT 11 06/16/2021   ALBUMIN 3.8 06/16/2021   ALKPHOS 72 06/16/2021    Electrolytes Lab Results  Component Value Date   NA 137 06/16/2021   K 3.6 06/16/2021   CL 101 06/16/2021   CALCIUM 9.2 06/16/2021    Bone No results found for: "VD25OH", "VD125OH2TOT", "BS9628ZM6", "QH4765YY5", "25OHVITD1", "25OHVITD2", "25OHVITD3", "TESTOFREE", "TESTOSTERONE"  Inflammation (CRP: Acute Phase) (ESR: Chronic Phase) No results found for: "CRP", "ESRSEDRATE", "LATICACIDVEN"       Note: Above Lab results reviewed.  Imaging  US Breast Limited Uni Left Inc Axilla CLINICAL DATA:  Patient presents with left breast swelling, skin discoloration and a palpable firm lump throughout the upper, upper outer to lateral left breast. The patient underwent a left lumpectomy for invasive mammary carcinoma with mucinous features in November 2021, treated with adjuvant radiation therapy consent normal lymph node removal.  EXAM: DIGITAL DIAGNOSTIC UNILATERAL LEFT MAMMOGRAM WITH IMPLANTS, CAD AND TOMOSYNTHESIS; ULTRASOUND LEFT BREAST LIMITED  TECHNIQUE: Left digital diagnostic mammography and breast tomosynthesis was performed. The images were evaluated with  computer-aided detection. Standard and/or implant displaced views were performed.; Targeted ultrasound examination of the left breast was performed.  COMPARISON:  Previous exam(s).  ACR Breast Density Category b: There are scattered areas of fibroglandular density.  FINDINGS: In the upper outer left breast, along the lumpectomy site, there is trabecular thickening with several rim calcified oil cysts, which have become more apparent and increased in number since the 12/04/2020 exam. There are no suspicious masses, or suspicious calcifications. The retroglandular, peripherally calcified, implant is unchanged.  On physical exam, left breast shows skin discoloration in the lateral to upper outer quadrant, some upper outer quadrant nipple skin thickening, and a palpable firm lump consistent with a combination of the postsurgical radiation changes in the underlying implant.  Targeted ultrasound is performed, showing overall increased tissue echogenicity consistent with inflammation. A discrete oil cyst is seen near the scar measuring 1.2 cm, corresponding to the largest rim calcified oil cysts seen mammographically. No suspicious masses.  IMPRESSION: 1. No evidence of new or recurrent breast carcinoma. 2. Clinical and imaging findings are consistent with ongoing fat necrosis throughout the surgical bed.  RECOMMENDATION: 1. Diagnostic mammography in September 2023. 2. If the patient's clinical findings appear to be worsening, surgical consultation for possible skin punch biopsy would be recommended.  I have discussed the findings and recommendations with the patient. If applicable, a reminder letter will be sent to the patient regarding the next appointment.  BI-RADS CATEGORY  2: Benign.  Electronically Signed   By: Lajean Manes M.D.   On: 07/03/2021 15:51 MM DIAG BREAST W/IMPLANT TOMO UNI L CLINICAL DATA:  Patient presents with left breast swelling, skin discoloration  and a palpable firm lump throughout the upper, upper outer to lateral left breast. The patient underwent a left lumpectomy for invasive mammary carcinoma with mucinous features in November 2021,  treated with adjuvant radiation therapy consent normal lymph node removal.  EXAM: DIGITAL DIAGNOSTIC UNILATERAL LEFT MAMMOGRAM WITH IMPLANTS, CAD AND TOMOSYNTHESIS; ULTRASOUND LEFT BREAST LIMITED  TECHNIQUE: Left digital diagnostic mammography and breast tomosynthesis was performed. The images were evaluated with computer-aided detection. Standard and/or implant displaced views were performed.; Targeted ultrasound examination of the left breast was performed.  COMPARISON:  Previous exam(s).  ACR Breast Density Category b: There are scattered areas of fibroglandular density.  FINDINGS: In the upper outer left breast, along the lumpectomy site, there is trabecular thickening with several rim calcified oil cysts, which have become more apparent and increased in number since the 12/04/2020 exam. There are no suspicious masses, or suspicious calcifications. The retroglandular, peripherally calcified, implant is unchanged.  On physical exam, left breast shows skin discoloration in the lateral to upper outer quadrant, some upper outer quadrant nipple skin thickening, and a palpable firm lump consistent with a combination of the postsurgical radiation changes in the underlying implant.  Targeted ultrasound is performed, showing overall increased tissue echogenicity consistent with inflammation. A discrete oil cyst is seen near the scar measuring 1.2 cm, corresponding to the largest rim calcified oil cysts seen mammographically. No suspicious masses.  IMPRESSION: 1. No evidence of new or recurrent breast carcinoma. 2. Clinical and imaging findings are consistent with ongoing fat necrosis throughout the surgical bed.  RECOMMENDATION: 1. Diagnostic mammography in September 2023. 2. If the  patient's clinical findings appear to be worsening, surgical consultation for possible skin punch biopsy would be recommended.  I have discussed the findings and recommendations with the patient. If applicable, a reminder letter will be sent to the patient regarding the next appointment.  BI-RADS CATEGORY  2: Benign.  Electronically Signed   By: Lajean Manes M.D.   On: 07/03/2021 15:51  Assessment  The primary encounter diagnosis was S/P kyphoplasty (L2 done in 2015 with Dr Rolena Infante). Diagnoses of Lumbar degenerative disc disease, Lumbar facet arthropathy, Chronic midline thoracic back pain, Other intervertebral disc degeneration, lumbar region, Other intervertebral disc degeneration, thoracic region, and Chronic pain syndrome were also pertinent to this visit.  Plan of Care    Ms. DAIJA ROUTSON has a current medication list which includes the following long-term medication(s): atorvastatin, calcium-vitamin d, cetirizine, gabapentin, lisinopril-hydrochlorothiazide, and omeprazole.  Pharmacotherapy (Medications Ordered): Meds ordered this encounter  Medications   diclofenac (VOLTAREN) 75 MG EC tablet    Sig: Take 1 tablet (75 mg total) by mouth daily after breakfast.    Dispense:  30 tablet    Refill:  0   Orders:  Orders Placed This Encounter  Procedures   Ambulatory referral to Physical Therapy    Referral Priority:   Routine    Referral Type:   Physical Medicine    Referral Reason:   Specialty Services Required    Requested Specialty:   Physical Therapy    Number of Visits Requested:   1   Follow-up plan:   Return in about 6 months (around 03/12/2022).    Recent Visits No visits were found meeting these conditions. Showing recent visits within past 90 days and meeting all other requirements Today's Visits Date Type Provider Dept  09/10/21 Office Visit Gillis Santa, MD [facility]  Showing today's visits and meeting all other requirements Future  Appointments No visits were found meeting these conditions. Showing future appointments within next 90 days and meeting all other requirements  I discussed the assessment and treatment plan with the patient. The patient was provided  an opportunity to ask questions and all were answered. The patient agreed with the plan and demonstrated an understanding of the instructions.  Patient advised to call back or seek an in-person evaluation if the symptoms or condition worsens.  Duration of encounter: 85mnutes.  Note by: BGillis Santa MD Date: 09/10/2021; Time: 2:53 PM

## 2021-09-24 DIAGNOSIS — L218 Other seborrheic dermatitis: Secondary | ICD-10-CM | POA: Diagnosis not present

## 2021-09-24 DIAGNOSIS — L2089 Other atopic dermatitis: Secondary | ICD-10-CM | POA: Diagnosis not present

## 2021-09-24 DIAGNOSIS — L298 Other pruritus: Secondary | ICD-10-CM | POA: Diagnosis not present

## 2021-10-01 DIAGNOSIS — H40053 Ocular hypertension, bilateral: Secondary | ICD-10-CM | POA: Diagnosis not present

## 2021-10-01 DIAGNOSIS — Z961 Presence of intraocular lens: Secondary | ICD-10-CM | POA: Diagnosis not present

## 2021-10-09 ENCOUNTER — Other Ambulatory Visit: Payer: Self-pay | Admitting: General Surgery

## 2021-10-20 DIAGNOSIS — H35351 Cystoid macular degeneration, right eye: Secondary | ICD-10-CM | POA: Diagnosis not present

## 2021-11-25 DIAGNOSIS — L218 Other seborrheic dermatitis: Secondary | ICD-10-CM | POA: Diagnosis not present

## 2021-11-25 DIAGNOSIS — L2089 Other atopic dermatitis: Secondary | ICD-10-CM | POA: Diagnosis not present

## 2021-11-25 DIAGNOSIS — L589 Radiodermatitis, unspecified: Secondary | ICD-10-CM | POA: Diagnosis not present

## 2021-12-07 ENCOUNTER — Ambulatory Visit
Admission: RE | Admit: 2021-12-07 | Discharge: 2021-12-07 | Disposition: A | Discharge status: Home / Self Care | Payer: Medicare HMO | Source: Ambulatory Visit | Attending: Oncology | Admitting: Oncology

## 2021-12-07 DIAGNOSIS — N6315 Unspecified lump in the right breast, overlapping quadrants: Secondary | ICD-10-CM | POA: Diagnosis not present

## 2021-12-07 DIAGNOSIS — Z853 Personal history of malignant neoplasm of breast: Secondary | ICD-10-CM | POA: Diagnosis not present

## 2021-12-07 DIAGNOSIS — N6002 Solitary cyst of left breast: Secondary | ICD-10-CM | POA: Diagnosis not present

## 2021-12-07 DIAGNOSIS — N6320 Unspecified lump in the left breast, unspecified quadrant: Secondary | ICD-10-CM | POA: Insufficient documentation

## 2021-12-07 DIAGNOSIS — C50912 Malignant neoplasm of unspecified site of left female breast: Secondary | ICD-10-CM | POA: Insufficient documentation

## 2021-12-07 DIAGNOSIS — N6325 Unspecified lump in the left breast, overlapping quadrants: Secondary | ICD-10-CM | POA: Insufficient documentation

## 2021-12-08 DIAGNOSIS — R413 Other amnesia: Secondary | ICD-10-CM | POA: Diagnosis not present

## 2021-12-08 DIAGNOSIS — N183 Chronic kidney disease, stage 3 unspecified: Secondary | ICD-10-CM | POA: Diagnosis not present

## 2021-12-08 DIAGNOSIS — R202 Paresthesia of skin: Secondary | ICD-10-CM | POA: Diagnosis not present

## 2021-12-08 DIAGNOSIS — R2689 Other abnormalities of gait and mobility: Secondary | ICD-10-CM | POA: Diagnosis not present

## 2021-12-09 ENCOUNTER — Other Ambulatory Visit
Admission: RE | Admit: 2021-12-09 | Discharge: 2021-12-09 | Disposition: A | Discharge status: Home / Self Care | Payer: Medicare HMO | Source: Ambulatory Visit | Attending: Ophthalmology | Admitting: Ophthalmology

## 2021-12-09 DIAGNOSIS — H15001 Unspecified scleritis, right eye: Secondary | ICD-10-CM | POA: Insufficient documentation

## 2021-12-09 LAB — C-REACTIVE PROTEIN: CRP: 0.6 mg/dL (ref <1.0)

## 2021-12-09 LAB — CBC
HCT: 38.2 % (ref 36.0–46.0)
Hemoglobin: 12.5 g/dL (ref 12.0–15.0)
MCH: 30.7 pg (ref 26.0–34.0)
MCHC: 32.7 g/dL (ref 30.0–36.0)
MCV: 93.9 fL (ref 80.0–100.0)
Platelets: 280 10*3/uL (ref 150–400)
RBC: 4.07 MIL/uL (ref 3.87–5.11)
RDW: 12.2 % (ref 11.5–15.5)
WBC: 8.3 10*3/uL (ref 4.0–10.5)
nRBC: 0 % (ref 0.0–0.2)

## 2021-12-09 LAB — URIC ACID: Uric Acid, Serum: 7.4 mg/dL — ABNORMAL HIGH (ref 2.5–7.1)

## 2021-12-09 LAB — CREATININE, SERUM
Creatinine, Ser: 1.14 mg/dL — ABNORMAL HIGH (ref 0.44–1.00)
GFR, Estimated: 50 mL/min — ABNORMAL LOW (ref >60)

## 2021-12-09 LAB — SEDIMENTATION RATE: Sed Rate: 15 mm/hr (ref 0–30)

## 2021-12-10 DIAGNOSIS — H35351 Cystoid macular degeneration, right eye: Secondary | ICD-10-CM | POA: Diagnosis not present

## 2021-12-10 DIAGNOSIS — H15001 Unspecified scleritis, right eye: Secondary | ICD-10-CM | POA: Diagnosis not present

## 2021-12-10 LAB — CYCLIC CITRUL PEPTIDE ANTIBODY, IGG/IGA: CCP Antibodies IgG/IgA: 1 units (ref 0–19)

## 2021-12-10 LAB — ACETYLCHOLINE RECEPTOR, BINDING: Acety choline binding ab: <0.03 nmol/L (ref 0.00–0.24)

## 2021-12-10 LAB — ANCA PROFILE
Anti-MPO Antibodies: <0.2 units (ref 0.0–0.9)
Anti-PR3 Antibodies: <0.2 units (ref 0.0–0.9)
Atypical P-ANCA titer: <1:20 {titer}
C-ANCA: <1:20 {titer}
P-ANCA: <1:20 {titer}

## 2021-12-10 LAB — RPR: RPR Ser Ql: NONREACTIVE

## 2021-12-10 LAB — FLUORESCENT TREPONEMAL AB(FTA)-IGG-BLD: Fluorescent Treponemal Ab, IgG: NONREACTIVE

## 2021-12-11 LAB — ANTINUCLEAR ANTIBODIES, IFA: ANA Ab, IFA: NEGATIVE

## 2021-12-14 DIAGNOSIS — H35351 Cystoid macular degeneration, right eye: Secondary | ICD-10-CM | POA: Diagnosis not present

## 2021-12-15 ENCOUNTER — Other Ambulatory Visit: Payer: Self-pay

## 2021-12-15 DIAGNOSIS — H40053 Ocular hypertension, bilateral: Secondary | ICD-10-CM | POA: Diagnosis not present

## 2021-12-15 DIAGNOSIS — Z0100 Encounter for examination of eyes and vision without abnormal findings: Secondary | ICD-10-CM | POA: Diagnosis not present

## 2021-12-15 DIAGNOSIS — C50412 Malignant neoplasm of upper-outer quadrant of left female breast: Secondary | ICD-10-CM | POA: Diagnosis not present

## 2021-12-15 DIAGNOSIS — C50912 Malignant neoplasm of unspecified site of left female breast: Secondary | ICD-10-CM

## 2021-12-15 DIAGNOSIS — Z170 Estrogen receptor positive status [ER+]: Secondary | ICD-10-CM | POA: Diagnosis not present

## 2021-12-16 ENCOUNTER — Inpatient Hospital Stay: Payer: Medicare HMO | Admitting: Oncology

## 2021-12-16 ENCOUNTER — Inpatient Hospital Stay: Payer: Medicare HMO | Attending: Oncology

## 2021-12-16 ENCOUNTER — Encounter: Payer: Self-pay | Admitting: Oncology

## 2021-12-16 ENCOUNTER — Inpatient Hospital Stay: Payer: Medicare HMO

## 2021-12-16 DIAGNOSIS — M8580 Other specified disorders of bone density and structure, unspecified site: Secondary | ICD-10-CM

## 2021-12-16 DIAGNOSIS — Z170 Estrogen receptor positive status [ER+]: Secondary | ICD-10-CM

## 2021-12-16 DIAGNOSIS — C50412 Malignant neoplasm of upper-outer quadrant of left female breast: Secondary | ICD-10-CM

## 2021-12-16 DIAGNOSIS — C50912 Malignant neoplasm of unspecified site of left female breast: Secondary | ICD-10-CM

## 2021-12-16 LAB — CBC WITH DIFFERENTIAL/PLATELET
Abs Immature Granulocytes: 0.11 10*3/uL — ABNORMAL HIGH (ref 0.00–0.07)
Basophils Absolute: 0 10*3/uL (ref 0.0–0.1)
Basophils Relative: 0 %
Eosinophils Absolute: 0.3 10*3/uL (ref 0.0–0.5)
Eosinophils Relative: 3 %
HCT: 37.3 % (ref 36.0–46.0)
Hemoglobin: 12.8 g/dL (ref 12.0–15.0)
Immature Granulocytes: 1 %
Lymphocytes Relative: 12 %
Lymphs Abs: 1.3 10*3/uL (ref 0.7–4.0)
MCH: 31.8 pg (ref 26.0–34.0)
MCHC: 34.3 g/dL (ref 30.0–36.0)
MCV: 92.6 fL (ref 80.0–100.0)
Monocytes Absolute: 0.8 10*3/uL (ref 0.1–1.0)
Monocytes Relative: 8 %
Neutro Abs: 8.5 10*3/uL — ABNORMAL HIGH (ref 1.7–7.7)
Neutrophils Relative %: 76 %
Platelets: 262 10*3/uL (ref 150–400)
RBC: 4.03 MIL/uL (ref 3.87–5.11)
RDW: 12.4 % (ref 11.5–15.5)
WBC: 11 10*3/uL — ABNORMAL HIGH (ref 4.0–10.5)
nRBC: 0 % (ref 0.0–0.2)

## 2021-12-16 LAB — COMPREHENSIVE METABOLIC PANEL
ALT: 15 U/L (ref 0–44)
AST: 18 U/L (ref 15–41)
Albumin: 3.4 g/dL — ABNORMAL LOW (ref 3.5–5.0)
Alkaline Phosphatase: 55 U/L (ref 38–126)
Anion gap: 6 (ref 5–15)
BUN: 32 mg/dL — ABNORMAL HIGH (ref 8–23)
CO2: 28 mmol/L (ref 22–32)
Calcium: 8.7 mg/dL — ABNORMAL LOW (ref 8.9–10.3)
Chloride: 105 mmol/L (ref 98–111)
Creatinine, Ser: 1.34 mg/dL — ABNORMAL HIGH (ref 0.44–1.00)
GFR, Estimated: 41 mL/min — ABNORMAL LOW (ref >60)
Glucose, Bld: 130 mg/dL — ABNORMAL HIGH (ref 70–99)
Potassium: 4.1 mmol/L (ref 3.5–5.1)
Sodium: 139 mmol/L (ref 135–145)
Total Bilirubin: 0.5 mg/dL (ref 0.3–1.2)
Total Protein: 5.9 g/dL — ABNORMAL LOW (ref 6.5–8.1)

## 2021-12-16 MED ORDER — ANASTROZOLE 1 MG PO TABS: 1.0000 mg | ORAL_TABLET | Freq: Every day | ORAL | 1 refills | Status: DC

## 2021-12-16 MED ORDER — DENOSUMAB 60 MG/ML ~~LOC~~ SOSY
60.0000 mg | PREFILLED_SYRINGE | Freq: Once | SUBCUTANEOUS | Status: AC
  Administered 2021-12-16: 60 mg via SUBCUTANEOUS
  Filled 2021-12-16: qty 1

## 2021-12-16 NOTE — Assessment & Plan Note (Signed)
10/30/2019, DEXA showed osteopenia, 10-year risk of any major fracture 15%, 10-year risk of hip fracture 2.3%.  Continue calcium 1200 mg daily and vitamin D supplementation. Proceed with Prolia today.

## 2021-12-16 NOTE — Progress Notes (Signed)
Yeast under breast stomach issue back pain 7/10 upper

## 2021-12-16 NOTE — Progress Notes (Signed)
Hematology/Oncology Progress note [phone] [phone]      Patient Care Team: Juluis Pitch, MD as PCP - General (Family Medicine) Rico Junker, RN as Registered Nurse Bary Castilla, Forest Gleason, MD as Consulting Physician (General Surgery) Noreene Filbert, MD as Referring Physician (Radiation Oncology) Earlie Server, MD as Consulting Physician (Oncology)  ASSESSMENT & PLAN:   Cancer Staging  Malignant neoplasm of upper-outer quadrant of left breast in female, estrogen receptor positive ([place]) Staging form: Breast, AJCC 8th Edition - Clinical: Stage IB (cT2, cN0, cM0, G2, ER+, PR+, HER2-) - Signed by Earlie Server, MD on 01/03/2020 - Pathologic stage from 02/04/2020: Stage IA (pT2, pN0, cM0, G2, ER+, PR+, HER2-, Oncotype DX score: 5) - Signed by Earlie Server, MD on 12/16/2021   Malignant neoplasm of upper-outer quadrant of left breast in female, estrogen receptor positive ([place]) 01/2020 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 s/p adjuvant radiation. Labs are reviewed and discussed with patient. Continue Arimidex  Continue annual mammogram bilaterally.  Left breast upper outer quadrant masslike tissue thickening, likely due to scar/cysts.  No evidence of cancer on mammogram.  Osteopenia 10/30/2019, DEXA showed osteopenia, 10-year risk of any major fracture 15%, 10-year risk of hip fracture 2.3%.  Continue calcium 1200 mg daily and vitamin D supplementation. Proceed with Prolia today.  Orders Placed This Encounter  Procedures   CBC with Differential/Platelet    Standing Status:   Future    Standing Expiration Date:   12/17/2022   Comprehensive metabolic panel    Standing Status:   Future    Standing Expiration Date:   12/16/2022    All questions were answered. The patient knows to call the clinic with any problems, questions or concerns.  Earlie Server, MD, PhD [facility] Hematology  Oncology 12/16/2021   CHIEF COMPLAINTS/REASON FOR VISIT:  Follow-up for breast cancer management  HISTORY OF PRESENTING ILLNESS:   Tonya Bowen is a  76 y.o.  female with presents for follow up of evaluation of breast cancer Oncology History  Malignant neoplasm of upper-outer quadrant of left breast in female, estrogen receptor positive ([place])  12/19/2019 Mammogram   Unilateral left breast diagnostic mammogram showed  59mm irregular left upper outer breast mass, no left axillary lymphadenopathy.   01/03/2020 Initial Diagnosis   Malignant neoplasm of upper-outer quadrant of left breast in female, estrogen receptor positive  Family history of breast cancer: paternal aunt x 2 breast cancer Family history of other cancers: brother lung cancer, mother possible uterine cancer Menarche: 10 Menopause: hystectomy in 55s Age at first live childbirth:23 Used OCP: about 10 years Used estrogen and progesterone therapy: 20+ years HRT History of Radiation to the chest: none Previous of breast biopsy: none   01/03/2020 Cancer Staging   Staging form: Breast, AJCC 8th Edition - Clinical: Stage IB (cT2, cN0, cM0, G2, ER+, PR+, HER2-) - Signed by Earlie Server, MD on 01/03/2020   01/21/2020 Genetic Testing   Negative genetic testing. No pathogenic variants identified on the [facility] Common Hereditary Cancers Panel +EGFR. The report date is 01/21/2020.  The Common Hereditary Cancers Panel offered by [facility] includes sequencing and/or deletion duplication testing of the following 48 genes: APC, ATM, AXIN2, BARD1, BMPR1A, BRCA1, BRCA2, BRIP1, CDH1, CDKN2A (p14ARF), CDKN2A (p16INK4a), CKD4, CHEK2, CTNNA1, DICER1, EGFR,  EPCAM (Deletion/duplication testing only), GREM1 (promoter region deletion/duplication testing only), KIT, MEN1, MLH1, MSH2, MSH3, MSH6, MUTYH, NBN, NF1, NHTL1, PALB2, PDGFRA, PMS2, POLD1, POLE, PTEN,  RAD50, RAD51C, RAD51D, RNF43, SDHB, SDHC, SDHD, SMAD4, SMARCA4. STK11, TP53, TSC1, TSC2, and VHL.   The following genes were evaluated for sequence changes only: SDHA and HOXB13 c.251G>A variant only.   02/04/2020 Cancer Staging   Staging form: Breast, AJCC 8th Edition - Pathologic stage from 02/04/2020: Stage IA (pT2, pN0, cM0, G2, ER+, PR+, HER2-, Oncotype DX score: 5) - Signed by Earlie Server, MD on 12/16/2021 Stage prefix: Initial diagnosis Multigene prognostic tests performed: Oncotype DX Recurrence score range: Less than 11 Histologic grading system: 3 grade system    - 04/29/2020 Radiation Therapy   finished adjuvant radiation therapy   05/14/2021 -  Anti-estrogen oral therapy   started on Arimidex   07/03/2021 Mammogram   Unilateral diagnostic mammogram /ultrasound left-for evaluation of left upper outer quadrant breast mass 1. No evidence of new or recurrent breast carcinoma. 2. Clinical and imaging findings are consistent with ongoing fat necrosis throughout the surgical bed    Imaging     12/07/2021 Mammogram   Bilateral diagnostic mammogram/implant abdominal bilateral -No evidence of malignancy in either breast     She has balance difficulties and she was recently seen by neurology Dr. Manuella Ghazi on 06/04/2020.  Patient had work-up including SPEP and immunofixation.  Labs reviewed.  No M protein was identified.  INTERVAL HISTORY LITSY EPTING is a 76 y.o. female who has above history reviewed by me today presents for follow up visit for management of left breast cancer, pT2N0 , ER/PR positive, HER-2 negative. Patient takes anastrozole 1 mg daily.  Overall tolerates well, manageable side effects. + back pain, chronic.    Review of Systems  Constitutional:  Negative for appetite change, chills, fatigue and fever.  HENT:   Negative for hearing loss and voice change.   Eyes:  Negative for eye problems.  Respiratory:  Negative for chest tightness and cough.   Cardiovascular:  Negative for chest pain.  Gastrointestinal:  Negative for abdominal distention, abdominal pain and blood in  stool.  Endocrine: Negative for hot flashes.  Genitourinary:  Negative for difficulty urinating and frequency.   Musculoskeletal:  Positive for arthralgias and back pain.  Skin:  Negative for itching and rash.  Neurological:  Negative for extremity weakness.  Hematological:  Negative for adenopathy.  Psychiatric/Behavioral:  Negative for confusion.     MEDICAL HISTORY:  Past Medical History:  Diagnosis Date   Arthritis    Cancer ([place]) 12/27/2019   left breast   Chronic back pain    Complication of anesthesia    pt states she felt every thing during breast augmentation surgery   Diabetes mellitus without complication ([place])    Family history of breast cancer    Family history of lung cancer    Family history of uterine cancer    GERD (gastroesophageal reflux disease)    Hypertension    Prediabetes    Stomach ulcer     SURGICAL HISTORY: Past Surgical History:  Procedure Laterality Date   ABDOMINAL HYSTERECTOMY     AUGMENTATION MAMMAPLASTY Bilateral 1987   BREAST ENHANCEMENT SURGERY     BREAST LUMPECTOMY Left 01/2020   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   BREAST LUMPECTOMY WITH SENTINEL LYMPH NODE BIOPSY Left 02/04/2020   Procedure: BREAST LUMPECTOMY WITH SENTINEL LYMPH NODE BX;  Surgeon: Robert Bellow, MD;  Location: [facility];  Service: General;  Laterality: Left;   BUNIONECTOMY Bilateral    Great toes   CATARACT EXTRACTION Left    colonoscopy  KYPHOPLASTY N/A 02/06/2014   Procedure:  L2 KYPHOPLASTY;  Surgeon: Melina Schools, MD;  Location: [facility];  Service: Orthopedics;  Laterality: N/A;   RADIAL KERATOTOMY     TONSILLECTOMY     UPPER GI ENDOSCOPY      SOCIAL HISTORY: Social History   Socioeconomic History   Marital status: Divorced    Spouse name: Not on file   Number of children: Not on file   Years of education: Not on file   Highest education level: Not on file  Occupational History   Not on file  Tobacco Use   Smoking  status: Never   Smokeless tobacco: Never  Vaping Use   Vaping Use: Never used  Substance and Sexual Activity   Alcohol use: Yes    Alcohol/week: 2.0 standard drinks of alcohol    Types: 2 Glasses of wine per week    Comment: socially   Drug use: No   Sexual activity: Not Currently  Other Topics Concern   Not on file  Social History Narrative   Not on file   Social Determinants of Health   Financial Resource Strain: Not on file  Food Insecurity: Not on file  Transportation Needs: Not on file  Physical Activity: Not on file  Stress: Not on file  Social Connections: Not on file  Intimate Partner Violence: Not on file    FAMILY HISTORY: Family History  Problem Relation Age of Onset   Breast cancer Paternal Aunt    Breast cancer Paternal Aunt    Uterine cancer Mother        probable   Lung cancer Brother        metastatic disease; unsure if lung was pimary   Breast cancer Paternal 42    ALS Daughter    Breast cancer Cousin        dx 71s   Breast cancer Cousin        dx 60s   Breast cancer Cousin        dx 35s   Lung cancer Cousin     ALLERGIES:  has No Known Allergies.  MEDICATIONS:  Current Outpatient Medications  Medication Sig Dispense Refill   acetaminophen (TYLENOL) 325 MG tablet Take 650 mg by mouth every 6 (six) hours as needed for moderate pain.      anastrozole (ARIMIDEX) 1 MG tablet Take 1 tablet (1 mg total) by mouth daily. 90 tablet 1   atorvastatin (LIPITOR) 40 MG tablet      brimonidine-timolol (COMBIGAN) 0.2-0.5 % ophthalmic solution Place 1 drop into the right eye in the morning and at bedtime.     calcium-vitamin D (OSCAL WITH D) 250-125 MG-UNIT tablet Take 1 tablet by mouth daily.     cetirizine (ZYRTEC) 10 MG tablet Take 10 mg by mouth every morning.      Cyanocobalamin (VITAMIN B 12 PO) Take by mouth.     diclofenac (VOLTAREN) 75 MG EC tablet Take 1 tablet (75 mg total) by mouth daily after breakfast. 30 tablet 0   gabapentin (NEURONTIN)  300 MG capsule Take 1 capsule (300 mg total) by mouth 3 (three) times daily. 90 capsule 5   lisinopril-hydrochlorothiazide (ZESTORETIC) 20-12.5 MG tablet Take 1 tablet by mouth every morning.      omeprazole (PRILOSEC) 40 MG capsule Take 1 tablet by mouth every morning.      Semaglutide (OZEMPIC, 0.25 OR 0.5 MG/DOSE, [place]) Inject 0.25 mg into the skin once a week.      vitamin C (  ASCORBIC ACID) 500 MG tablet Take 500 mg by mouth daily.     Vitamin D, Ergocalciferol, (DRISDOL) 1.25 MG (50000 UT) CAPS capsule Take 50,000 Units by mouth every 7 (seven) days.      No current facility-administered medications for this visit.     PHYSICAL EXAMINATION: ECOG PERFORMANCE STATUS: 1 - Symptomatic but completely ambulatory Vitals:   12/16/21 1337  BP: (!) 103/54  Pulse: 72  Resp: 18  SpO2: 99%   Filed Weights   12/16/21 1329  Weight: 200 lb (90.7 kg)    Physical Exam Constitutional:      General: She is not in acute distress. HENT:     Head: Normocephalic and atraumatic.  Eyes:     General: No scleral icterus. Cardiovascular:     Rate and Rhythm: Normal rate and regular rhythm.     Heart sounds: Normal heart sounds.  Pulmonary:     Effort: Pulmonary effort is normal. No respiratory distress.     Breath sounds: No wheezing.  Abdominal:     General: Bowel sounds are normal. There is no distension.     Palpations: Abdomen is soft.  Musculoskeletal:        General: No deformity. Normal range of motion.     Cervical back: Normal range of motion and neck supple.  Skin:    General: Skin is warm and dry.     Findings: No erythema or rash.  Neurological:     Mental Status: She is alert and oriented to person, place, and time. Mental status is at baseline.     Cranial Nerves: No cranial nerve deficit.     Coordination: Coordination normal.  Psychiatric:        Mood and Affect: Mood normal.     LABORATORY DATA:  I have reviewed the data as listed    Latest Ref Rng & Units 12/16/2021     1:14 PM 12/09/2021    4:35 PM 06/16/2021    1:57 PM  CBC  WBC 4.0 - 10.5 K/uL 11.0  8.3  8.1   Hemoglobin 12.0 - 15.0 g/dL 12.8  12.5  13.3   Hematocrit 36.0 - 46.0 % 37.3  38.2  40.1   Platelets 150 - 400 K/uL 262  280  306       Latest Ref Rng & Units 12/16/2021    1:14 PM 12/09/2021    4:35 PM 06/16/2021    1:57 PM  CMP  Glucose 70 - 99 mg/dL 130   110   BUN 8 - 23 mg/dL 32   21   Creatinine 0.44 - 1.00 mg/dL 1.34  1.14  1.12   Sodium 135 - 145 mmol/L 139   137   Potassium 3.5 - 5.1 mmol/L 4.1   3.6   Chloride 98 - 111 mmol/L 105   101   CO2 22 - 32 mmol/L 28   29   Calcium 8.9 - 10.3 mg/dL 8.7   9.2   Total Protein 6.5 - 8.1 g/dL 5.9   6.9   Total Bilirubin 0.3 - 1.2 mg/dL 0.5   0.6   Alkaline Phos 38 - 126 U/L 55   72   AST 15 - 41 U/L 18   18   ALT 0 - 44 U/L 15   11      RADIOGRAPHIC STUDIES: I have personally reviewed the radiological images as listed and agreed with the findings in the report. MM DIAG BREAST W/IMPLANT TOMO BILATERAL  Result Date:  12/07/2021 CLINICAL DATA:  76 year old female with history of left breast cancer status post lumpectomy and radiation therapy in 2021. Patient complains of firmness in the lumpectomy site with a discrete palpable mass. EXAM: DIGITAL DIAGNOSTIC BILATERAL MAMMOGRAM WITH IMPLANTS, TOMOSYNTHESIS; ULTRASOUND LEFT BREAST LIMITED TECHNIQUE: Bilateral digital diagnostic mammography and breast tomosynthesis was performed. Standard and/or implant displaced views were performed.; Targeted ultrasound examination of the left breast was performed. COMPARISON:  Previous exam(s). ACR Breast Density Category b: There are scattered areas of fibroglandular density. FINDINGS: Lumpectomy changes with trabecular thickening is seen in the upper-outer quadrant of the left breast. Calcified oil cysts are more prominent than the prior exam dated 07/03/2021. No suspicious mass or malignant type microcalcifications identified in either breast. The patient has  retroglandular implants. I palpate a discrete mass in the left breast at 12 o'clock 5 cm from the nipple. Targeted ultrasound is performed, showing an oil cyst in the left breast at 12 o'clock 5 cm from the nipple measuring 1.3 x 0.9 x 1.1 cm. It is unchanged from the prior exam dated 07/03/2021. No suspicious mass identified at the lumpectomy site. IMPRESSION: No evidence of malignancy in either breast. RECOMMENDATION: Bilateral diagnostic mammogram in 1 year is recommended. I have discussed the findings and recommendations with the patient. If applicable, a reminder letter will be sent to the patient regarding the next appointment. BI-RADS CATEGORY  2: Benign. Electronically Signed   By: Lillia Mountain M.D.   On: 12/07/2021 16:04  US BREAST LTD UNI LEFT INC AXILLA  Result Date: 12/07/2021 CLINICAL DATA:  76 year old female with history of left breast cancer status post lumpectomy and radiation therapy in 2021. Patient complains of firmness in the lumpectomy site with a discrete palpable mass. EXAM: DIGITAL DIAGNOSTIC BILATERAL MAMMOGRAM WITH IMPLANTS, TOMOSYNTHESIS; ULTRASOUND LEFT BREAST LIMITED TECHNIQUE: Bilateral digital diagnostic mammography and breast tomosynthesis was performed. Standard and/or implant displaced views were performed.; Targeted ultrasound examination of the left breast was performed. COMPARISON:  Previous exam(s). ACR Breast Density Category b: There are scattered areas of fibroglandular density. FINDINGS: Lumpectomy changes with trabecular thickening is seen in the upper-outer quadrant of the left breast. Calcified oil cysts are more prominent than the prior exam dated 07/03/2021. No suspicious mass or malignant type microcalcifications identified in either breast. The patient has retroglandular implants. I palpate a discrete mass in the left breast at 12 o'clock 5 cm from the nipple. Targeted ultrasound is performed, showing an oil cyst in the left breast at 12 o'clock 5 cm from the  nipple measuring 1.3 x 0.9 x 1.1 cm. It is unchanged from the prior exam dated 07/03/2021. No suspicious mass identified at the lumpectomy site. IMPRESSION: No evidence of malignancy in either breast. RECOMMENDATION: Bilateral diagnostic mammogram in 1 year is recommended. I have discussed the findings and recommendations with the patient. If applicable, a reminder letter will be sent to the patient regarding the next appointment. BI-RADS CATEGORY  2: Benign. Electronically Signed   By: Lillia Mountain M.D.   On: 12/07/2021 16:04

## 2021-12-16 NOTE — Assessment & Plan Note (Addendum)
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 s/p adjuvant radiation. Labs are reviewed and discussed with patient. Continue Arimidex  Continue annual mammogram bilaterally.  Left breast upper outer quadrant masslike tissue thickening, likely due to scar/cysts.  No evidence of cancer on mammogram.

## 2021-12-17 DIAGNOSIS — H35351 Cystoid macular degeneration, right eye: Secondary | ICD-10-CM | POA: Diagnosis not present

## 2021-12-23 DIAGNOSIS — E663 Overweight: Secondary | ICD-10-CM | POA: Diagnosis not present

## 2021-12-23 DIAGNOSIS — E7800 Pure hypercholesterolemia, unspecified: Secondary | ICD-10-CM | POA: Diagnosis not present

## 2021-12-23 DIAGNOSIS — E1122 Type 2 diabetes mellitus with diabetic chronic kidney disease: Secondary | ICD-10-CM | POA: Diagnosis not present

## 2021-12-23 DIAGNOSIS — N1831 Chronic kidney disease, stage 3a: Secondary | ICD-10-CM | POA: Diagnosis not present

## 2021-12-23 DIAGNOSIS — E119 Type 2 diabetes mellitus without complications: Secondary | ICD-10-CM | POA: Diagnosis not present

## 2021-12-23 DIAGNOSIS — I10 Essential (primary) hypertension: Secondary | ICD-10-CM | POA: Diagnosis not present

## 2021-12-25 DIAGNOSIS — H40053 Ocular hypertension, bilateral: Secondary | ICD-10-CM | POA: Diagnosis not present

## 2021-12-28 DIAGNOSIS — R1310 Dysphagia, unspecified: Secondary | ICD-10-CM | POA: Diagnosis not present

## 2021-12-28 DIAGNOSIS — Z1211 Encounter for screening for malignant neoplasm of colon: Secondary | ICD-10-CM | POA: Diagnosis not present

## 2022-01-18 DIAGNOSIS — I4949 Other premature depolarization: Secondary | ICD-10-CM | POA: Diagnosis not present

## 2022-01-18 DIAGNOSIS — R1310 Dysphagia, unspecified: Secondary | ICD-10-CM | POA: Diagnosis not present

## 2022-01-18 DIAGNOSIS — K921 Melena: Secondary | ICD-10-CM | POA: Diagnosis not present

## 2022-01-18 DIAGNOSIS — R197 Diarrhea, unspecified: Secondary | ICD-10-CM | POA: Diagnosis not present

## 2022-01-18 DIAGNOSIS — R110 Nausea: Secondary | ICD-10-CM | POA: Diagnosis not present

## 2022-01-26 DIAGNOSIS — Z6832 Body mass index (BMI) 32.0-32.9, adult: Secondary | ICD-10-CM | POA: Diagnosis not present

## 2022-01-26 DIAGNOSIS — E119 Type 2 diabetes mellitus without complications: Secondary | ICD-10-CM | POA: Diagnosis not present

## 2022-01-26 DIAGNOSIS — H4061X1 Glaucoma secondary to drugs, right eye, mild stage: Secondary | ICD-10-CM | POA: Diagnosis not present

## 2022-01-26 DIAGNOSIS — I10 Essential (primary) hypertension: Secondary | ICD-10-CM | POA: Diagnosis not present

## 2022-01-26 DIAGNOSIS — E669 Obesity, unspecified: Secondary | ICD-10-CM | POA: Diagnosis not present

## 2022-01-26 DIAGNOSIS — Z853 Personal history of malignant neoplasm of breast: Secondary | ICD-10-CM | POA: Diagnosis not present

## 2022-01-26 DIAGNOSIS — E785 Hyperlipidemia, unspecified: Secondary | ICD-10-CM | POA: Diagnosis not present

## 2022-01-26 DIAGNOSIS — H40041 Steroid responder, right eye: Secondary | ICD-10-CM | POA: Diagnosis not present

## 2022-01-26 HISTORY — PX: GLAUCOMA SURGERY: SHX656

## 2022-02-01 DIAGNOSIS — H40053 Ocular hypertension, bilateral: Secondary | ICD-10-CM | POA: Diagnosis not present

## 2022-02-23 DIAGNOSIS — H35351 Cystoid macular degeneration, right eye: Secondary | ICD-10-CM | POA: Diagnosis not present

## 2022-03-02 ENCOUNTER — Encounter: Payer: Self-pay | Admitting: Student in an Organized Health Care Education/Training Program

## 2022-03-02 ENCOUNTER — Ambulatory Visit
Payer: Medicare HMO | Attending: Student in an Organized Health Care Education/Training Program | Admitting: Student in an Organized Health Care Education/Training Program

## 2022-03-02 VITALS — BP 121/58 | HR 62 | Temp 96.6°F | Ht 64.0 in | Wt 185.0 lb

## 2022-03-02 DIAGNOSIS — Z9889 Other specified postprocedural states: Secondary | ICD-10-CM | POA: Insufficient documentation

## 2022-03-02 DIAGNOSIS — G894 Chronic pain syndrome: Secondary | ICD-10-CM | POA: Insufficient documentation

## 2022-03-02 DIAGNOSIS — M546 Pain in thoracic spine: Secondary | ICD-10-CM | POA: Diagnosis not present

## 2022-03-02 DIAGNOSIS — M47816 Spondylosis without myelopathy or radiculopathy, lumbar region: Secondary | ICD-10-CM | POA: Insufficient documentation

## 2022-03-02 DIAGNOSIS — M5136 Other intervertebral disc degeneration, lumbar region: Secondary | ICD-10-CM | POA: Insufficient documentation

## 2022-03-02 DIAGNOSIS — G8929 Other chronic pain: Secondary | ICD-10-CM | POA: Diagnosis not present

## 2022-03-02 MED ORDER — GABAPENTIN 300 MG PO CAPS: 300.0000 mg | ORAL_CAPSULE | Freq: Three times a day (TID) | ORAL | 5 refills | Status: DC

## 2022-03-02 NOTE — Progress Notes (Signed)
PROVIDER NOTE: Information contained herein reflects review and annotations entered in association with encounter. Interpretation of such information and data should be left to medically-trained personnel. Information provided to patient can be located elsewhere in the medical record under "Patient Instructions". Document created using STT-dictation technology, any transcriptional errors that may result from process are unintentional.    Patient: Tonya Bowen  Service Category: E/M  Provider: Gillis Santa, MD  DOB: [birth year]  DOS: 09/04/2020  Specialty: Interventional Pain Management  MRN: [id number]  Setting: Ambulatory outpatient  PCP: Juluis Pitch, MD  Type: Established Patient    Referring Provider: Juluis Pitch, MD  Location: Office  Delivery: Face-to-face     HPI  Tonya Bowen, a 76 y.o. year old female, is here today because of her S/P kyphoplasty [Z98.890]. [name] primary complain today is Back Pain (middle)  Last encounter: My last encounter with her was on 09/10/2021 Pertinent problems: [name] has S/P kyphoplasty; Chronic midline thoracic back pain; Lumbar degenerative disc disease; Lumbar facet arthropathy; Chronic pain syndrome; and Malignant neoplasm of upper-outer quadrant of left breast in female, estrogen receptor positive ([place]) on their pertinent problem list. Pain Assessment: Severity of   is reported as a 3 /10.  Low back pain which she describes as throbbing, does get worse in the evening and with facet loading. Vitals:  height is _0  (1.626 m) and weight is 185 lb (83.9 kg). Her temporal temperature is 96.6 F (35.9 C) (abnormal). Her blood pressure is 121/58 (abnormal) and her pulse is 62. Her oxygen saturation is 100%.   Reason for encounter: medication management.    Patient follows up today for management of gabapentin.   No change in medical history since last visit.  Patient's pain is at baseline.  Patient continues multimodal pain regimen as  prescribed.  States that it provides pain relief and improvement in functional status.   ROS  Constitutional: Denies any fever or chills Gastrointestinal: No reported hemesis, hematochezia, vomiting, or acute GI distress Musculoskeletal:  Positive low back pain Neurological: No reported episodes of acute onset apraxia, aphasia, dysarthria, agnosia, amnesia, paralysis, loss of coordination, or loss of consciousness  Medication Review  Cyanocobalamin, Semaglutide, Vitamin D (Ergocalciferol), acetaminophen, anastrozole, ascorbic acid, atorvastatin, brimonidine, calcium-vitamin D, cetirizine, dorzolamide-timolol, gabapentin, lisinopril-hydrochlorothiazide, omeprazole, and predniSONE  History Review  Allergy: Tonya Bowen has No Known Allergies. Drug: Tonya Bowen  reports no history of drug use. Alcohol:  reports current alcohol use of about 2.0 standard drinks of alcohol per week. Tobacco:  reports that she has never smoked. She has never used smokeless tobacco. Social: Tonya Bowen  reports that she has never smoked. She has never used smokeless tobacco. She reports current alcohol use of about 2.0 standard drinks of alcohol per week. She reports that she does not use drugs. Medical:  has a past medical history of Arthritis, Cancer ([place]) (12/27/2019), Chronic back pain, Complication of anesthesia, Diabetes mellitus without complication ([place]), Family history of breast cancer, Family history of lung cancer, Family history of uterine cancer, GERD (gastroesophageal reflux disease), Hypertension, Prediabetes, and Stomach ulcer. Surgical: [name]  has a past surgical history that includes Abdominal hysterectomy; Tonsillectomy; Bunionectomy (Bilateral); Breast enhancement surgery; Cataract extraction (Left); Radial keratotomy; colonoscopy; Upper gi endoscopy; Kyphoplasty (N/A, 02/06/2014); Augmentation mammaplasty (Bilateral, 1987); Breast lumpectomy with sentinel lymph node bx (Left, 02/04/2020); Breast  lumpectomy (Left, 01/2020); and Glaucoma surgery (01/26/2022). Family: family history includes ALS in her daughter; Breast cancer in her cousin, cousin, cousin,  paternal aunt, paternal aunt, and paternal aunt; Lung cancer in her brother and cousin; Uterine cancer in her mother.  Laboratory Chemistry Profile   Renal Lab Results  Component Value Date   BUN 32 (H) 12/16/2021   CREATININE 1.34 (H) 12/16/2021   GFRAA >90 01/30/2014   GFRNONAA 41 (L) 12/16/2021     Hepatic Lab Results  Component Value Date   AST 18 12/16/2021   ALT 15 12/16/2021   ALBUMIN 3.4 (L) 12/16/2021   ALKPHOS 55 12/16/2021     Electrolytes Lab Results  Component Value Date   NA 139 12/16/2021   K 4.1 12/16/2021   CL 105 12/16/2021   CALCIUM 8.7 (L) 12/16/2021     Bone No results found for: "VD25OH", "VD125OH2TOT", "NT7001VC9", "SW9675FF6", "25OHVITD1", "25OHVITD2", "25OHVITD3", "TESTOFREE", "TESTOSTERONE"   Inflammation (CRP: Acute Phase) (ESR: Chronic Phase) Lab Results  Component Value Date   CRP 0.6 12/09/2021   ESRSEDRATE 15 12/09/2021       Note: Above Lab results reviewed.  Recent Imaging Review  US BREAST LTD UNI LEFT INC AXILLA CLINICAL DATA:  77 year old female with history of left breast cancer status post lumpectomy and radiation therapy in 2021. Patient complains of firmness in the lumpectomy site with a discrete palpable mass.  EXAM: DIGITAL DIAGNOSTIC BILATERAL MAMMOGRAM WITH IMPLANTS, TOMOSYNTHESIS; ULTRASOUND LEFT BREAST LIMITED  TECHNIQUE: Bilateral digital diagnostic mammography and breast tomosynthesis was performed. Standard and/or implant displaced views were performed.; Targeted ultrasound examination of the left breast was performed.  COMPARISON:  Previous exam(s).  ACR Breast Density Category b: There are scattered areas of fibroglandular density.  FINDINGS: Lumpectomy changes with trabecular thickening is seen in the upper-outer quadrant of the left breast.  Calcified oil cysts are more prominent than the prior exam dated 07/03/2021. No suspicious mass or malignant type microcalcifications identified in either breast. The patient has retroglandular implants.  I palpate a discrete mass in the left breast at 12 o'clock 5 cm from the nipple.  Targeted ultrasound is performed, showing an oil cyst in the left breast at 12 o'clock 5 cm from the nipple measuring 1.3 x 0.9 x 1.1 cm. It is unchanged from the prior exam dated 07/03/2021. No suspicious mass identified at the lumpectomy site.  IMPRESSION: No evidence of malignancy in either breast.  RECOMMENDATION: Bilateral diagnostic mammogram in 1 year is recommended.  I have discussed the findings and recommendations with the patient. If applicable, a reminder letter will be sent to the patient regarding the next appointment.  BI-RADS CATEGORY  2: Benign.  Electronically Signed   By: Lillia Mountain M.D.   On: 12/07/2021 16:04 MM DIAG BREAST W/IMPLANT TOMO BILATERAL CLINICAL DATA:  76 year old female with history of left breast cancer status post lumpectomy and radiation therapy in 2021. Patient complains of firmness in the lumpectomy site with a discrete palpable mass.  EXAM: DIGITAL DIAGNOSTIC BILATERAL MAMMOGRAM WITH IMPLANTS, TOMOSYNTHESIS; ULTRASOUND LEFT BREAST LIMITED  TECHNIQUE: Bilateral digital diagnostic mammography and breast tomosynthesis was performed. Standard and/or implant displaced views were performed.; Targeted ultrasound examination of the left breast was performed.  COMPARISON:  Previous exam(s).  ACR Breast Density Category b: There are scattered areas of fibroglandular density.  FINDINGS: Lumpectomy changes with trabecular thickening is seen in the upper-outer quadrant of the left breast. Calcified oil cysts are more prominent than the prior exam dated 07/03/2021. No suspicious mass or malignant type microcalcifications identified in either breast. The  patient has retroglandular implants.  I palpate a discrete mass in the  left breast at 12 o'clock 5 cm from the nipple.  Targeted ultrasound is performed, showing an oil cyst in the left breast at 12 o'clock 5 cm from the nipple measuring 1.3 x 0.9 x 1.1 cm. It is unchanged from the prior exam dated 07/03/2021. No suspicious mass identified at the lumpectomy site.  IMPRESSION: No evidence of malignancy in either breast.  RECOMMENDATION: Bilateral diagnostic mammogram in 1 year is recommended.  I have discussed the findings and recommendations with the patient. If applicable, a reminder letter will be sent to the patient regarding the next appointment.  BI-RADS CATEGORY  2: Benign.  Electronically Signed   By: Lillia Mountain M.D.   On: 12/07/2021 16:04  Note: Reviewed        Physical Exam  General appearance: Well nourished, well developed, and well hydrated. In no apparent acute distress Mental status: Alert, oriented x 3 (person, place, & time)       Respiratory: No evidence of acute respiratory distress Eyes: PERLA Vitals: BP (!) 121/58   Pulse 62   Temp (!) 96.6 F (35.9 C) (Temporal)   Ht _0  (1.626 m)   Wt 185 lb (83.9 kg)   SpO2 100%   BMI 31.76 kg/m  BMI: Estimated body mass index is 31.76 kg/m as calculated from the following:   Height as of this encounter: _1  (1.626 m).   Weight as of this encounter: 185 lb (83.9 kg). Ideal: Ideal body weight: 54.7 kg (120 lb 9.5 oz) Adjusted ideal body weight: 66.4 kg (146 lb 5.7 oz)    Lumbar Exam  Skin & Axial Inspection: No masses, redness, or swelling Alignment: Symmetrical Functional ROM: Pain restricted ROM       Stability: No instability detected Muscle Tone/Strength: Functionally intact. No obvious neuro-muscular anomalies detected. Sensory (Neurological): Musculoskeletal pain pattern Palpation: Complains of area being tender to palpation             Lower Extremity Exam      Side: Right lower extremity    Side: Left lower extremity  Stability: No instability observed           Stability: No instability observed          Skin & Extremity Inspection: Skin color, temperature, and hair growth are WNL. No peripheral edema or cyanosis. No masses, redness, swelling, asymmetry, or associated skin lesions. No contractures.   Skin & Extremity Inspection: Skin color, temperature, and hair growth are WNL. No peripheral edema or cyanosis. No masses, redness, swelling, asymmetry, or associated skin lesions. No contractures.  Functional ROM: Unrestricted ROM                   Functional ROM: Unrestricted ROM                  Muscle Tone/Strength: Functionally intact. No obvious neuro-muscular anomalies detected.   Muscle Tone/Strength: Functionally intact. No obvious neuro-muscular anomalies detected.  Sensory (Neurological): Unimpaired         Sensory (Neurological): Unimpaired        DTR: Patellar: deferred today Achilles: deferred today Plantar: deferred today   DTR: Patellar: deferred today Achilles: deferred today Plantar: deferred today  Palpation: No palpable anomalies   Palpation: No palpable anomalies      Assessment   Status Diagnosis  Controlled Controlled Controlled 1. S/P kyphoplasty (L2 done in 2015 with Dr Rolena Infante)   2. Lumbar degenerative disc disease   3. Lumbar facet arthropathy   4. Other  intervertebral disc degeneration, lumbar region   5. Chronic midline thoracic back pain   6. Chronic pain syndrome         Plan of Care  Ms. DARENE NAPPI has a current medication list which includes the following long-term medication(s): atorvastatin, calcium-vitamin d, cetirizine, lisinopril-hydrochlorothiazide, omeprazole, and gabapentin.  Pharmacotherapy (Medications Ordered): Meds ordered this encounter  Medications   gabapentin (NEURONTIN) 300 MG capsule    Sig: Take 1 capsule (300 mg total) by mouth 3 (three) times daily.    Dispense:  90 capsule    Refill:  5    Follow-up  plan:   Return in about 6 months (around 09/01/2022) for Medication Management, in person.   Recent Visits No visits were found meeting these conditions. Showing recent visits within past 90 days and meeting all other requirements Today's Visits Date Type Provider Dept  03/02/22 Office Visit Gillis Santa, MD [facility]  Showing today's visits and meeting all other requirements Future Appointments No visits were found meeting these conditions. Showing future appointments within next 90 days and meeting all other requirements  I discussed the assessment and treatment plan with the patient. The patient was provided an opportunity to ask questions and all were answered. The patient agreed with the plan and demonstrated an understanding of the instructions.  Patient advised to call back or seek an in-person evaluation if the symptoms or condition worsens.  Duration of encounter:20 minutes.  Note by: Gillis Santa, MD Date: 03/02/2022; Time: 3:18 PM

## 2022-03-02 NOTE — Progress Notes (Signed)
Safety precautions to be maintained throughout the outpatient stay will include: orient to surroundings, keep bed in low position, maintain call bell within reach at all times, provide assistance with transfer out of bed and ambulation.  

## 2022-03-03 DIAGNOSIS — R634 Abnormal weight loss: Secondary | ICD-10-CM | POA: Diagnosis not present

## 2022-03-03 DIAGNOSIS — R110 Nausea: Secondary | ICD-10-CM | POA: Diagnosis not present

## 2022-03-03 DIAGNOSIS — I10 Essential (primary) hypertension: Secondary | ICD-10-CM | POA: Diagnosis not present

## 2022-03-03 DIAGNOSIS — E119 Type 2 diabetes mellitus without complications: Secondary | ICD-10-CM | POA: Diagnosis not present

## 2022-03-17 SURGERY — COLONOSCOPY
Anesthesia: General

## 2022-03-19 DIAGNOSIS — H40041 Steroid responder, right eye: Secondary | ICD-10-CM | POA: Diagnosis not present

## 2022-03-22 DIAGNOSIS — I10 Essential (primary) hypertension: Secondary | ICD-10-CM | POA: Diagnosis not present

## 2022-03-22 DIAGNOSIS — R110 Nausea: Secondary | ICD-10-CM | POA: Diagnosis not present

## 2022-04-19 ENCOUNTER — Ambulatory Visit: Admit: 2022-04-19 | Payer: Medicare HMO | Admitting: Internal Medicine

## 2022-04-19 SURGERY — COLONOSCOPY
Anesthesia: General

## 2022-04-30 DIAGNOSIS — D485 Neoplasm of uncertain behavior of skin: Secondary | ICD-10-CM | POA: Diagnosis not present

## 2022-04-30 DIAGNOSIS — L2089 Other atopic dermatitis: Secondary | ICD-10-CM | POA: Diagnosis not present

## 2022-04-30 DIAGNOSIS — L821 Other seborrheic keratosis: Secondary | ICD-10-CM | POA: Diagnosis not present

## 2022-04-30 DIAGNOSIS — D2262 Melanocytic nevi of left upper limb, including shoulder: Secondary | ICD-10-CM | POA: Diagnosis not present

## 2022-04-30 DIAGNOSIS — D2261 Melanocytic nevi of right upper limb, including shoulder: Secondary | ICD-10-CM | POA: Diagnosis not present

## 2022-04-30 DIAGNOSIS — L309 Dermatitis, unspecified: Secondary | ICD-10-CM | POA: Diagnosis not present

## 2022-04-30 DIAGNOSIS — L578 Other skin changes due to chronic exposure to nonionizing radiation: Secondary | ICD-10-CM | POA: Diagnosis not present

## 2022-04-30 DIAGNOSIS — B372 Candidiasis of skin and nail: Secondary | ICD-10-CM | POA: Diagnosis not present

## 2022-05-04 ENCOUNTER — Other Ambulatory Visit: Payer: Self-pay | Admitting: Oncology

## 2022-05-06 DIAGNOSIS — R110 Nausea: Secondary | ICD-10-CM | POA: Diagnosis not present

## 2022-05-06 DIAGNOSIS — H02401 Unspecified ptosis of right eyelid: Secondary | ICD-10-CM | POA: Diagnosis not present

## 2022-05-06 DIAGNOSIS — H5461 Unqualified visual loss, right eye, normal vision left eye: Secondary | ICD-10-CM | POA: Diagnosis not present

## 2022-05-06 DIAGNOSIS — R2689 Other abnormalities of gait and mobility: Secondary | ICD-10-CM | POA: Diagnosis not present

## 2022-05-12 DIAGNOSIS — H4010X0 Unspecified open-angle glaucoma, stage unspecified: Secondary | ICD-10-CM | POA: Diagnosis not present

## 2022-05-14 DIAGNOSIS — H4010X0 Unspecified open-angle glaucoma, stage unspecified: Secondary | ICD-10-CM | POA: Diagnosis not present

## 2022-05-25 DIAGNOSIS — H401113 Primary open-angle glaucoma, right eye, severe stage: Secondary | ICD-10-CM | POA: Diagnosis not present

## 2022-05-25 DIAGNOSIS — E785 Hyperlipidemia, unspecified: Secondary | ICD-10-CM | POA: Diagnosis not present

## 2022-05-25 DIAGNOSIS — Z853 Personal history of malignant neoplasm of breast: Secondary | ICD-10-CM | POA: Diagnosis not present

## 2022-05-25 DIAGNOSIS — E1139 Type 2 diabetes mellitus with other diabetic ophthalmic complication: Secondary | ICD-10-CM | POA: Diagnosis not present

## 2022-05-25 DIAGNOSIS — E119 Type 2 diabetes mellitus without complications: Secondary | ICD-10-CM | POA: Diagnosis not present

## 2022-05-25 DIAGNOSIS — H4089 Other specified glaucoma: Secondary | ICD-10-CM | POA: Diagnosis not present

## 2022-05-25 DIAGNOSIS — H409 Unspecified glaucoma: Secondary | ICD-10-CM | POA: Diagnosis not present

## 2022-05-31 ENCOUNTER — Ambulatory Visit
Admission: RE | Admit: 2022-05-31 | Discharge: 2022-05-31 | Disposition: A | Discharge status: Home / Self Care | Payer: Medicare HMO | Source: Ambulatory Visit | Attending: Radiation Oncology | Admitting: Radiation Oncology

## 2022-05-31 ENCOUNTER — Encounter: Payer: Self-pay | Admitting: Radiation Oncology

## 2022-05-31 VITALS — BP 137/79 | HR 89 | Temp 97.8°F | Resp 20 | Wt 173.0 lb

## 2022-05-31 DIAGNOSIS — Z79811 Long term (current) use of aromatase inhibitors: Secondary | ICD-10-CM | POA: Diagnosis not present

## 2022-05-31 DIAGNOSIS — Z170 Estrogen receptor positive status [ER+]: Secondary | ICD-10-CM | POA: Insufficient documentation

## 2022-05-31 DIAGNOSIS — Z923 Personal history of irradiation: Secondary | ICD-10-CM | POA: Insufficient documentation

## 2022-05-31 DIAGNOSIS — C50412 Malignant neoplasm of upper-outer quadrant of left female breast: Secondary | ICD-10-CM | POA: Insufficient documentation

## 2022-05-31 NOTE — Progress Notes (Signed)
Radiation Oncology Follow up Note  Name: Tonya Bowen   Date:   05/31/2022 MRN:  [id number] DOB: [birth year]    This 77 y.o. female presents to the clinic today for 2-year follow-up status post whole breast radiation.  To her left breast for stage Ib ER/PR positive invasive mammary carcinoma  REFERRING PROVIDER: Juluis Pitch, MD  HPI: Patient is a 77 year old female now out 2 years having pleated whole breast radiation to her left breast for stage Ib (T2 N0 M0) ER/PR positive invasive mammary carcinoma.  She has been having some slight itching or burning sensation in her left lower neck skin there is slightly erythematous.  She otherwise specifically denies breast tenderness cough or bone pain..  She is supposed to be on Arimidex does not know if she is taking that at this time.  She had mammograms back in September which were BI-RADS 2 benign which I reviewed  COMPLICATIONS OF TREATMENT: none  FOLLOW UP COMPLIANCE: keeps appointments   PHYSICAL EXAM:  BP 137/79 (BP Location: Left Arm, Patient Position: Sitting, Cuff Size: Normal)   Pulse 89   Temp 97.8 F (36.6 C) (Tympanic)   Resp 20   Wt 173 lb (78.5 kg)   BMI 29.70 kg/m  Patient has a large seroma in the left breast.  Is unchanged from prior exam no other dominant masses noted in either breast no axillary or supraclavicular adenopathy is appreciated.  Well-developed well-nourished patient in NAD. HEENT reveals PERLA, EOMI, discs not visualized.  Oral cavity is clear. No oral mucosal lesions are identified. Neck is clear without evidence of cervical or supraclavicular adenopathy. Lungs are clear to A&P. Cardiac examination is essentially unremarkable with regular rate and rhythm without murmur rub or thrill. Abdomen is benign with no organomegaly or masses noted. Motor sensory and DTR levels are equal and symmetric in the upper and lower extremities. Cranial nerves II through XII are grossly intact. Proprioception is intact. No  peripheral adenopathy or edema is identified. No motor or sensory levels are noted. Crude visual fields are within normal range.  RADIOLOGY RESULTS: Mammogram reviewed compatible with above-stated findings  PLAN: Present time patient is doing well with no evidence of disease.  I have suggested some Benadryl gel for the skin in her lower neck I have assured her this is not radiation related.  I have otherwise asked to see her back in 1 year for follow-up and then I will discontinue follow-up care.  She will review her Arimidex with medical oncology on her next visit.  Patient is to call with any concerns.  I would like to take this opportunity to thank you for allowing me to participate in the care of your patient.Noreene Filbert, MD

## 2022-06-11 ENCOUNTER — Encounter: Payer: Self-pay | Admitting: Gastroenterology

## 2022-06-14 ENCOUNTER — Ambulatory Visit: Payer: Medicare HMO | Admitting: Certified Registered"

## 2022-06-14 ENCOUNTER — Encounter: Payer: Self-pay | Admitting: Gastroenterology

## 2022-06-14 ENCOUNTER — Ambulatory Visit
Admission: RE | Admit: 2022-06-14 | Discharge: 2022-06-14 | Disposition: A | Discharge status: Home / Self Care | Payer: Medicare HMO | Attending: Gastroenterology | Admitting: Gastroenterology

## 2022-06-14 ENCOUNTER — Encounter
Admission: RE | Disposition: A | Discharge status: Home / Self Care | Payer: Self-pay | Source: Home / Self Care | Attending: Gastroenterology

## 2022-06-14 DIAGNOSIS — K224 Dyskinesia of esophagus: Secondary | ICD-10-CM | POA: Insufficient documentation

## 2022-06-14 DIAGNOSIS — R1310 Dysphagia, unspecified: Secondary | ICD-10-CM | POA: Diagnosis not present

## 2022-06-14 DIAGNOSIS — K640 First degree hemorrhoids: Secondary | ICD-10-CM | POA: Diagnosis not present

## 2022-06-14 DIAGNOSIS — I10 Essential (primary) hypertension: Secondary | ICD-10-CM | POA: Diagnosis not present

## 2022-06-14 DIAGNOSIS — Z79899 Other long term (current) drug therapy: Secondary | ICD-10-CM | POA: Diagnosis not present

## 2022-06-14 DIAGNOSIS — Z8711 Personal history of peptic ulcer disease: Secondary | ICD-10-CM | POA: Insufficient documentation

## 2022-06-14 DIAGNOSIS — D122 Benign neoplasm of ascending colon: Secondary | ICD-10-CM | POA: Insufficient documentation

## 2022-06-14 DIAGNOSIS — D125 Benign neoplasm of sigmoid colon: Secondary | ICD-10-CM | POA: Insufficient documentation

## 2022-06-14 DIAGNOSIS — Z90710 Acquired absence of both cervix and uterus: Secondary | ICD-10-CM | POA: Diagnosis not present

## 2022-06-14 DIAGNOSIS — Z1211 Encounter for screening for malignant neoplasm of colon: Secondary | ICD-10-CM | POA: Diagnosis not present

## 2022-06-14 DIAGNOSIS — K635 Polyp of colon: Secondary | ICD-10-CM | POA: Diagnosis not present

## 2022-06-14 DIAGNOSIS — K2100 Gastro-esophageal reflux disease with esophagitis, without bleeding: Secondary | ICD-10-CM | POA: Diagnosis not present

## 2022-06-14 DIAGNOSIS — Z09 Encounter for follow-up examination after completed treatment for conditions other than malignant neoplasm: Secondary | ICD-10-CM | POA: Insufficient documentation

## 2022-06-14 DIAGNOSIS — K5730 Diverticulosis of large intestine without perforation or abscess without bleeding: Secondary | ICD-10-CM | POA: Insufficient documentation

## 2022-06-14 DIAGNOSIS — Z7985 Long-term (current) use of injectable non-insulin antidiabetic drugs: Secondary | ICD-10-CM | POA: Insufficient documentation

## 2022-06-14 DIAGNOSIS — K219 Gastro-esophageal reflux disease without esophagitis: Secondary | ICD-10-CM | POA: Diagnosis not present

## 2022-06-14 DIAGNOSIS — K5790 Diverticulosis of intestine, part unspecified, without perforation or abscess without bleeding: Secondary | ICD-10-CM | POA: Diagnosis not present

## 2022-06-14 DIAGNOSIS — E119 Type 2 diabetes mellitus without complications: Secondary | ICD-10-CM | POA: Diagnosis not present

## 2022-06-14 DIAGNOSIS — K649 Unspecified hemorrhoids: Secondary | ICD-10-CM | POA: Diagnosis not present

## 2022-06-14 DIAGNOSIS — D126 Benign neoplasm of colon, unspecified: Secondary | ICD-10-CM | POA: Diagnosis not present

## 2022-06-14 HISTORY — PX: ESOPHAGOGASTRODUODENOSCOPY: SHX5428

## 2022-06-14 HISTORY — PX: COLONOSCOPY: SHX5424

## 2022-06-14 LAB — GLUCOSE, CAPILLARY: Glucose-Capillary: 98 mg/dL (ref 70–99)

## 2022-06-14 SURGERY — COLONOSCOPY
Anesthesia: General

## 2022-06-14 MED ORDER — LIDOCAINE HCL (PF) 2 % IJ SOLN
INTRAMUSCULAR | Status: AC
  Filled 2022-06-14: qty 5

## 2022-06-14 MED ORDER — PHENYLEPHRINE HCL (PRESSORS) 10 MG/ML IV SOLN
INTRAVENOUS | Status: DC | PRN
  Administered 2022-06-14 (×2): 80 ug via INTRAVENOUS

## 2022-06-14 MED ORDER — SODIUM CHLORIDE 0.9 % IV SOLN
INTRAVENOUS | Status: DC
  Administered 2022-06-14: 20 mL/h via INTRAVENOUS

## 2022-06-14 MED ORDER — LIDOCAINE HCL (CARDIAC) PF 100 MG/5ML IV SOSY
PREFILLED_SYRINGE | INTRAVENOUS | Status: DC | PRN
  Administered 2022-06-14: 100 mg via INTRAVENOUS

## 2022-06-14 MED ORDER — PROPOFOL 10 MG/ML IV BOLUS
INTRAVENOUS | Status: DC | PRN
  Administered 2022-06-14: 140 ug/kg/min via INTRAVENOUS
  Administered 2022-06-14: 100 mg via INTRAVENOUS

## 2022-06-14 MED ORDER — PROPOFOL 10 MG/ML IV BOLUS
INTRAVENOUS | Status: AC
  Filled 2022-06-14: qty 40

## 2022-06-14 NOTE — Op Note (Signed)
[facility] Gastroenterology Patient Name: Tonya Bowen Procedure Date: 06/14/2022 9:31 AM MRN: [id number] Account #: [id number] Date of Birth: Apr 19, 1945 Admit Type: Outpatient Age: 77 Room: [facility] ENDO ROOM 2 Gender: Female Note Status: Finalized Instrument Name: Altamese Cabal Endoscope [id number] Procedure:             Upper GI endoscopy Indications:           Dysphagia Providers:             Rueben Bash, DO Referring MD:          Youlanda Roys. Lovie Macadamia, MD (Referring MD) Medicines:             Monitored Anesthesia Care Complications:         No immediate complications. Estimated blood loss:                         Minimal. Procedure:             Pre-Anesthesia Assessment:                        - Prior to the procedure, a History and Physical was                         performed, and patient medications and allergies were                         reviewed. The patient is competent. The risks and                         benefits of the procedure and the sedation options and                         risks were discussed with the patient. All questions                         were answered and informed consent was obtained.                         Patient identification and proposed procedure were                         verified by the physician, the nurse, the anesthetist                         and the technician in the endoscopy suite. Mental                         Status Examination: alert and oriented. Airway                         Examination: normal oropharyngeal airway and neck                         mobility. Respiratory Examination: clear to                         auscultation. CV Examination: RRR, no murmurs, no S3  or S4. Prophylactic Antibiotics: The patient does not                         require prophylactic antibiotics. Prior                         Anticoagulants: The patient has taken no anticoagulant                          or antiplatelet agents. ASA Grade Assessment: III - A                         patient with severe systemic disease. After reviewing                         the risks and benefits, the patient was deemed in                         satisfactory condition to undergo the procedure. The                         anesthesia plan was to use monitored anesthesia care                         (MAC). Immediately prior to administration of                         medications, the patient was re-assessed for adequacy                         to receive sedatives. The heart rate, respiratory                         rate, oxygen saturations, blood pressure, adequacy of                         pulmonary ventilation, and response to care were                         monitored throughout the procedure. The physical                         status of the patient was re-assessed after the                         procedure.                        After obtaining informed consent, the endoscope was                         passed under direct vision. Throughout the procedure,                         the patient's blood pressure, pulse, and oxygen                         saturations were monitored continuously. The Endoscope  was introduced through the mouth, and advanced to the                         second part of duodenum. The upper GI endoscopy was                         accomplished without difficulty. The patient tolerated                         the procedure well. Findings:      The duodenal bulb, first portion of the duodenum and second portion of       the duodenum were normal. Estimated blood loss: none.      The entire examined stomach was normal. Estimated blood loss: none.      The Z-line was regular. Estimated blood loss: none.      Esophagogastric landmarks were identified: the gastroesophageal junction       was found at 35 cm from the incisors.      Normal mucosa was found  in the entire esophagus. The scope was       withdrawn. Dilation was performed with a Maloney dilator with no       resistance at 52 Fr. The dilation site was examined following endoscope       reinsertion and showed no change. Estimated blood loss: none. Biopsies       were taken with a cold forceps for histology. from mid and distal       esophagus to rule out EOE. Estimated blood loss was minimal.      Abnormal motility was noted in the esophagus. The cricopharyngeus was       normal. There is spasticity of the esophageal body. The distal       esophagus/lower esophageal sphincter is open. Tertiary peristaltic waves       are noted. Estimated blood loss: none.      The exam of the esophagus was otherwise normal. Impression:            - Normal duodenal bulb, first portion of the duodenum                         and second portion of the duodenum.                        - Normal stomach.                        - Z-line regular.                        - Esophagogastric landmarks identified.                        - Normal mucosa was found in the entire esophagus.                         Dilated. Biopsied.                        - Abnormal esophageal motility. Recommendation:        - Patient has a contact number available for  emergencies. The signs and symptoms of potential                         delayed complications were discussed with the patient.                         Return to normal activities tomorrow. Written                         discharge instructions were provided to the patient.                        - Discharge patient to home.                        - Resume previous diet.                        - Continue present medications.                        - Await pathology results.                        - Repeat upper endoscopy PRN for retreatment.                        - Return to GI office as previously scheduled.                        - if  dysphagia persists, consider motility and                         manometry study.                        - proceed with colonoscopy                        - The findings and recommendations were discussed with                         the patient. Procedure Code(s):     --- Professional ---                        [id number], Esophagogastroduodenoscopy, flexible,                         transoral; with biopsy, single or multiple                        43450, Dilation of esophagus, by unguided sound or                         bougie, single or multiple passes Diagnosis Code(s):     --- Professional ---                        K22.4, Dyskinesia of esophagus                        R13.10, Dysphagia, unspecified CPT copyright 2022  American Medical Association. All rights reserved. The codes documented in this report are preliminary and upon coder review may  be revised to meet current compliance requirements. Attending Participation:      I personally performed the entire procedure. Volney American, DO Annamaria Helling DO, DO 06/14/2022 9:55:47 AM This report has been signed electronically. Number of Addenda: 0 Note Initiated On: 06/14/2022 9:31 AM Estimated Blood Loss:  Estimated blood loss was minimal.      [facility]

## 2022-06-14 NOTE — Anesthesia Postprocedure Evaluation (Signed)
Anesthesia Post Note  Patient: Tonya Bowen  Procedure(s) Performed: COLONOSCOPY ESOPHAGOGASTRODUODENOSCOPY (EGD)  Patient location during evaluation: PACU Anesthesia Type: General Level of consciousness: awake and alert Pain management: pain level controlled Vital Signs Assessment: post-procedure vital signs reviewed and stable Respiratory status: spontaneous breathing, nonlabored ventilation, respiratory function stable and patient connected to nasal cannula oxygen Cardiovascular status: blood pressure returned to baseline and stable Postop Assessment: no apparent nausea or vomiting Anesthetic complications: no   No notable events documented.   Last Vitals:  Vitals:   06/14/22 0849 06/14/22 1026  BP: (!) 161/77 (!) 98/48  Pulse: 82 67  Resp: 20 20  Temp: (!) 35.7 C   SpO2: 100% 99%    Last Pain:  Vitals:   06/14/22 1026  TempSrc:   PainSc: [name]

## 2022-06-14 NOTE — H&P (Signed)
Pre-Procedure H&P   Patient ID: Tonya Bowen is a 77 y.o. female.  Gastroenterology Provider: Annamaria Helling, DO  Referring Provider: Laurine Blazer, PA PCP: Juluis Pitch, MD  Date: 06/14/2022  HPI [name] is a 77 y.o. female who presents today for Esophagogastroduodenoscopy and Colonoscopy for Dysphagia, colorectal cancer screening .  Pt with gerd and dysphagia to solids (breads). No issues with liquids or pills.  No odynophagia.  H. pylori breath testing negative  EGD in 2014 demonstrating gastric ulcer negative for H. pylori on biopsy  Last underwent colonoscopy in 2013 with diverticulosis and internal hemorrhoids with 10-year recommended follow-up  Status post hysterectomy  No family history of colon cancer or colon polyps  Past Medical History:  Diagnosis Date   Arthritis    Cancer 12/27/2019   left breast   Chronic back pain    Complication of anesthesia    pt states she felt every thing during breast augmentation surgery   Diabetes mellitus without complication    Family history of breast cancer    Family history of lung cancer    Family history of uterine cancer    GERD (gastroesophageal reflux disease)    Hypertension    Stomach ulcer     Past Surgical History:  Procedure Laterality Date   ABDOMINAL HYSTERECTOMY     AUGMENTATION MAMMAPLASTY Bilateral 1987   BREAST ENHANCEMENT SURGERY     BREAST LUMPECTOMY Left 01/2020   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   BREAST LUMPECTOMY WITH SENTINEL LYMPH NODE BIOPSY Left 02/04/2020   Procedure: BREAST LUMPECTOMY WITH SENTINEL LYMPH NODE BX;  Surgeon: Robert Bellow, MD;  Location: [facility];  Service: General;  Laterality: Left;   BUNIONECTOMY Bilateral    Great toes   CATARACT EXTRACTION Left    colonoscopy     GLAUCOMA SURGERY  01/26/2022   KYPHOPLASTY N/A 02/06/2014   Procedure:  L2 KYPHOPLASTY;  Surgeon: Melina Schools, MD;  Location: [place];  Service:  Orthopedics;  Laterality: N/A;   RADIAL KERATOTOMY     TONSILLECTOMY     UPPER GI ENDOSCOPY      Family History No h/o GI disease or malignancy  Review of Systems  Constitutional:  Negative for activity change, appetite change, chills, diaphoresis, fatigue, fever and unexpected weight change.  HENT:  Positive for trouble swallowing. Negative for voice change.   Respiratory:  Negative for shortness of breath and wheezing.   Cardiovascular:  Negative for chest pain, palpitations and leg swelling.  Gastrointestinal:  Positive for abdominal pain. Negative for abdominal distention, anal bleeding, blood in stool, constipation, diarrhea, nausea, rectal pain and vomiting.  Musculoskeletal:  Negative for arthralgias and myalgias.  Skin:  Negative for color change and pallor.  Neurological:  Negative for dizziness, syncope and weakness.  Psychiatric/Behavioral:  Negative for confusion.   All other systems reviewed and are negative.    Medications No current facility-administered medications on file prior to encounter.   Current Outpatient Medications on File Prior to Encounter  Medication Sig Dispense Refill   acetaminophen (TYLENOL) 325 MG tablet Take 650 mg by mouth every 6 (six) hours as needed for moderate pain.      brimonidine (ALPHAGAN) 0.2 % ophthalmic solution 1 drop 3 (three) times daily.     Semaglutide (OZEMPIC, 0.25 OR 0.5 MG/DOSE, [place]) Inject 0.25 mg into the skin once a week.      atorvastatin (LIPITOR) 40 MG tablet  (Patient not taking: Reported on 05/31/2022)  calcium-vitamin D (OSCAL WITH D) 250-125 MG-UNIT tablet Take 1 tablet by mouth daily. (Patient not taking: Reported on 05/31/2022)     cetirizine (ZYRTEC) 10 MG tablet Take 10 mg by mouth every morning.  (Patient not taking: Reported on 05/31/2022)     Cyanocobalamin (VITAMIN B 12 PO) Take by mouth. (Patient not taking: Reported on 05/31/2022)     dorzolamide-timolol (COSOPT) 22.3-6.8 MG/ML ophthalmic solution Place 1  drop into the right eye 2 (two) times daily. (Patient not taking: Reported on 05/31/2022)     gabapentin (NEURONTIN) 300 MG capsule Take 1 capsule (300 mg total) by mouth 3 (three) times daily. (Patient not taking: Reported on 05/31/2022) 90 capsule 5   lisinopril-hydrochlorothiazide (ZESTORETIC) 20-12.5 MG tablet Take 1 tablet by mouth every morning.  (Patient not taking: Reported on 05/31/2022)     omeprazole (PRILOSEC) 40 MG capsule Take 1 tablet by mouth every morning.  (Patient not taking: Reported on 05/31/2022)     predniSONE (DELTASONE) 20 MG tablet Take 20 mg by mouth 3 (three) times daily. (Patient not taking: Reported on 05/31/2022)     vitamin C (ASCORBIC ACID) 500 MG tablet Take 500 mg by mouth daily. (Patient not taking: Reported on 05/31/2022)     Vitamin D, Ergocalciferol, (DRISDOL) 1.25 MG (50000 UT) CAPS capsule Take 50,000 Units by mouth every 7 (seven) days.  (Patient not taking: Reported on 05/31/2022)      Pertinent medications related to GI and procedure were reviewed by me with the patient prior to the procedure   Current Facility-Administered Medications:    0.9 %  sodium chloride infusion, , Intravenous, Continuous, Annamaria Helling, DO, Last Rate: 20 mL/hr at 06/14/22 0906, 20 mL/hr at 06/14/22 0906      No Known Allergies Allergies were reviewed by me prior to the procedure  Objective   Body mass index is 29.28 kg/m. Vitals:   06/14/22 0849  BP: (!) 161/77  Pulse: 82  Resp: 20  Temp: (!) 96.3 F (35.7 C)  TempSrc: Temporal  SpO2: 100%  Weight: 77.4 kg  Height: 5\' 4"  (1.626 m)     Physical Exam Vitals and nursing note reviewed.  Constitutional:      General: She is not in acute distress.    Appearance: Normal appearance. She is not ill-appearing, toxic-appearing or diaphoretic.  HENT:     Head: Normocephalic and atraumatic.     Nose: Nose normal.     Mouth/Throat:     Mouth: Mucous membranes are moist.     Pharynx: Oropharynx is clear.  Eyes:      General: No scleral icterus.    Extraocular Movements: Extraocular movements intact.  Cardiovascular:     Rate and Rhythm: Normal rate and regular rhythm.     Heart sounds: Normal heart sounds. No murmur heard.    No friction rub. No gallop.  Pulmonary:     Effort: Pulmonary effort is normal. No respiratory distress.     Breath sounds: Normal breath sounds. No wheezing, rhonchi or rales.  Abdominal:     General: Bowel sounds are normal. There is no distension.     Palpations: Abdomen is soft.     Tenderness: There is no abdominal tenderness. There is no guarding or rebound.  Musculoskeletal:     Cervical back: Neck supple.     Right lower leg: No edema.     Left lower leg: No edema.  Skin:    General: Skin is warm and dry.  Coloration: Skin is not jaundiced or pale.  Neurological:     General: No focal deficit present.     Mental Status: She is alert and oriented to person, place, and time. Mental status is at baseline.  Psychiatric:        Mood and Affect: Mood normal.        Behavior: Behavior normal.        Thought Content: Thought content normal.        Judgment: Judgment normal.      Assessment:  [name] is a 77 y.o. female  who presents today for Esophagogastroduodenoscopy and Colonoscopy for Dysphagia, colorectal cancer screening.  Plan:  Esophagogastroduodenoscopy and Colonoscopy with possible intervention today  Esophagogastroduodenoscopy and Colonoscopy with possible biopsy, control of bleeding, polypectomy, and interventions as necessary has been discussed with the patient/patient representative. Informed consent was obtained from the patient/patient representative after explaining the indication, nature, and risks of the procedure including but not limited to death, bleeding, perforation, missed neoplasm/lesions, cardiorespiratory compromise, and reaction to medications. Opportunity for questions was given and appropriate answers were provided.  Patient/patient representative has verbalized understanding is amenable to undergoing the procedure.   Annamaria Helling, DO  [facility]  Portions of the record may have been created with voice recognition software. Occasional wrong-word or 'sound-a-like' substitutions may have occurred due to the inherent limitations of voice recognition software.  Read the chart carefully and recognize, using context, where substitutions may have occurred.

## 2022-06-14 NOTE — Anesthesia Preprocedure Evaluation (Signed)
Anesthesia Evaluation  Patient identified by MRN, date of birth, ID band Patient awake    Reviewed: Allergy & Precautions, H&P , NPO status , Patient's Chart, lab work & pertinent test results, reviewed documented beta blocker date and time   History of Anesthesia Complications (+) AWARENESS UNDER ANESTHESIA and history of anesthetic complications  Airway Mallampati: II   Neck ROM: full    Dental  (+) Poor Dentition   Pulmonary neg pulmonary ROS   Pulmonary exam normal        Cardiovascular Exercise Tolerance: Good hypertension, On Medications negative cardio ROS Normal cardiovascular exam Rhythm:regular Rate:Normal     Neuro/Psych negative neurological ROS  negative psych ROS   GI/Hepatic Neg liver ROS, PUD,GERD  Medicated,,  Endo/Other  negative endocrine ROSdiabetes, Well Controlled, Type 2    Renal/GU negative Renal ROS  negative genitourinary   Musculoskeletal   Abdominal   Peds  Hematology negative hematology ROS (+)   Anesthesia Other Findings Past Medical History: No date: Arthritis 12/27/2019: Cancer ([place])     Comment:  left breast No date: Chronic back pain No date: Complication of anesthesia     Comment:  pt states she felt every thing during breast               augmentation surgery No date: Diabetes mellitus without complication (HCC) No date: Family history of breast cancer No date: Family history of lung cancer No date: Family history of uterine cancer No date: GERD (gastroesophageal reflux disease) No date: Hypertension No date: Stomach ulcer Past Surgical History: No date: ABDOMINAL HYSTERECTOMY 1987: AUGMENTATION MAMMAPLASTY; Bilateral No date: BREAST ENHANCEMENT SURGERY 01/2020: BREAST LUMPECTOMY; Left     Comment:  INVASIVE SOLID PAPILLARY CARCINOMA WITH MUCINOUS               CARCINOMA 02/04/2020: BREAST LUMPECTOMY WITH SENTINEL LYMPH NODE BIOPSY; Left     Comment:  Procedure:  BREAST LUMPECTOMY WITH SENTINEL LYMPH NODE               BX;  Surgeon: Robert Bellow, MD;  Location: [facility]               ORS;  Service: General;  Laterality: Left; No date: BUNIONECTOMY; Bilateral     Comment:  Great toes No date: CATARACT EXTRACTION; Left No date: colonoscopy 01/26/2022: GLAUCOMA SURGERY 02/06/2014: KYPHOPLASTY; N/A     Comment:  Procedure:  L2 KYPHOPLASTY;  Surgeon: Melina Schools, MD;              Location: [place];  Service: Orthopedics;  Laterality: N/A; No date: RADIAL KERATOTOMY No date: TONSILLECTOMY No date: UPPER GI ENDOSCOPY BMI    Body Mass Index: 29.28 kg/m     Reproductive/Obstetrics negative OB ROS                             Anesthesia Physical Anesthesia Plan  ASA: 3  Anesthesia Plan: General   Post-op Pain Management:    Induction:   PONV Risk Score and Plan:   Airway Management Planned:   Additional Equipment:   Intra-op Plan:   Post-operative Plan:   Informed Consent: I have reviewed the patients History and Physical, chart, labs and discussed the procedure including the risks, benefits and alternatives for the proposed anesthesia with the patient or authorized representative who has indicated his/her understanding and acceptance.     Dental Advisory Given  Plan Discussed with: CRNA  Anesthesia Plan  Comments:        Anesthesia Quick Evaluation

## 2022-06-14 NOTE — Op Note (Signed)
[facility] Patient Name: Tonya Bowen Procedure Date: 06/14/2022 9:30 AM MRN: [id number] Account #: [id number] Date of Birth: 09/09/1945 Admit Type: Outpatient Age: 77 Room: [facility] ENDO ROOM 2 Gender: Female Note Status: Finalized Instrument Name: Colonoscope [id number] Procedure:             Colonoscopy Indications:           Screening for colorectal malignant neoplasm Providers:             Rueben Bash, DO Referring MD:          Youlanda Roys. Lovie Macadamia, MD (Referring MD) Medicines:             Monitored Anesthesia Care Complications:         No immediate complications. Estimated blood loss:                         Minimal. Procedure:             Pre-Anesthesia Assessment:                        - Prior to the procedure, a History and Physical was                         performed, and patient medications and allergies were                         reviewed. The patient is competent. The risks and                         benefits of the procedure and the sedation options and                         risks were discussed with the patient. All questions                         were answered and informed consent was obtained.                         Patient identification and proposed procedure were                         verified by the physician, the nurse, the anesthetist                         and the technician in the endoscopy suite. Mental                         Status Examination: alert and oriented. Airway                         Examination: normal oropharyngeal airway and neck                         mobility. Respiratory Examination: clear to                         auscultation. CV Examination: RRR, no murmurs, no S3  or S4. Prophylactic Antibiotics: The patient does not                         require prophylactic antibiotics. Prior                         Anticoagulants: The patient has taken no anticoagulant                          or antiplatelet agents. ASA Grade Assessment: III - A                         patient with severe systemic disease. After reviewing                         the risks and benefits, the patient was deemed in                         satisfactory condition to undergo the procedure. The                         anesthesia plan was to use monitored anesthesia care                         (MAC). Immediately prior to administration of                         medications, the patient was re-assessed for adequacy                         to receive sedatives. The heart rate, respiratory                         rate, oxygen saturations, blood pressure, adequacy of                         pulmonary ventilation, and response to care were                         monitored throughout the procedure. The physical                         status of the patient was re-assessed after the                         procedure.                        After obtaining informed consent, the colonoscope was                         passed under direct vision. Throughout the procedure,                         the patient's blood pressure, pulse, and oxygen                         saturations were monitored continuously. The  Colonoscope was introduced through the anus and                         advanced to the the terminal ileum, with                         identification of the appendiceal orifice and IC                         valve. The colonoscopy was performed without                         difficulty. The patient tolerated the procedure well.                         The quality of the bowel preparation was evaluated                         using the BBPS [facility] Bowel Preparation Scale) with                         scores of: Right Colon = 2 (minor amount of residual                         staining, small fragments of stool and/or opaque                          liquid, but mucosa seen well), Transverse Colon = 2                         (minor amount of residual staining, small fragments of                         stool and/or opaque liquid, but mucosa seen well) and                         Left Colon = 2 (minor amount of residual staining,                         small fragments of stool and/or opaque liquid, but                         mucosa seen well). The total BBPS score equals 6. The                         quality of the bowel preparation was good. The                         terminal ileum, ileocecal valve, appendiceal orifice,                         and rectum were photographed. Findings:      The perianal and digital rectal examinations were normal. Pertinent       negatives include normal sphincter tone.      The terminal ileum appeared normal. Estimated blood loss: none.      Multiple small-mouthed diverticula were found in the entire  colon. Most       prevalent in the left colon. Estimated blood loss: none.      Non-bleeding internal hemorrhoids were found during retroflexion. The       hemorrhoids were Grade I (internal hemorrhoids that do not prolapse).       Estimated blood loss: none.      Two sessile polyps were found in the sigmoid colon and ascending colon.       The polyps were 1 to 2 mm in size. These polyps were removed with a       jumbo cold forceps. Resection and retrieval were complete. Estimated       blood loss was minimal.      Two sessile polyps were found in the sigmoid colon and ascending colon.       The polyps were 3 to 4 mm in size. These polyps were removed with a cold       snare. Resection and retrieval were complete. Estimated blood loss was       minimal.      The exam was otherwise without abnormality on direct and retroflexion       views. Impression:            - The examined portion of the ileum was normal.                        - Diverticulosis in the entire examined colon.                         - Non-bleeding internal hemorrhoids.                        - Two 1 to 2 mm polyps in the sigmoid colon and in the                         ascending colon, removed with a jumbo cold forceps.                         Resected and retrieved.                        - Two 3 to 4 mm polyps in the sigmoid colon and in the                         ascending colon, removed with a cold snare. Resected                         and retrieved.                        - The examination was otherwise normal on direct and                         retroflexion views. Recommendation:        - Patient has a contact number available for                         emergencies. The signs and symptoms of potential  delayed complications were discussed with the patient.                         Return to normal activities tomorrow. Written                         discharge instructions were provided to the patient.                        - Discharge patient to home.                        - Resume previous diet.                        - Continue present medications.                        - No ibuprofen, naproxen, or other non-steroidal                         anti-inflammatory drugs for 5 days after polyp removal.                        - Await pathology results.                        - Repeat colonoscopy for surveillance based on                         pathology results.                        - Return to GI office as previously scheduled.                        - The findings and recommendations were discussed with                         the patient. Procedure Code(s):     --- Professional ---                        [phone], Colonoscopy, flexible; with removal of                         tumor(s), polyp(s), or other lesion(s) by snare                         technique                        [id number], 27, Colonoscopy, flexible; with biopsy, single                         or multiple Diagnosis  Code(s):     --- Professional ---                        Z12.11, Encounter for screening for malignant neoplasm                         of colon  K64.0, First degree hemorrhoids                        D12.5, Benign neoplasm of sigmoid colon                        D12.2, Benign neoplasm of ascending colon                        K57.30, Diverticulosis of large intestine without                         perforation or abscess without bleeding CPT copyright 2022 American Medical Association. All rights reserved. The codes documented in this report are preliminary and upon coder review may  be revised to meet current compliance requirements. Attending Participation:      I personally performed the entire procedure. Volney American, DO Annamaria Helling DO, DO 06/14/2022 10:26:23 AM This report has been signed electronically. Number of Addenda: 0 Note Initiated On: 06/14/2022 9:30 AM Scope Withdrawal Time: 0 hours 15 minutes 13 seconds  Total Procedure Duration: 0 hours 20 minutes 33 seconds  Estimated Blood Loss:  Estimated blood loss was minimal.      [facility]

## 2022-06-14 NOTE — Transfer of Care (Signed)
Immediate Anesthesia Transfer of Care Note  Patient: Tonya Bowen  Procedure(s) Performed: COLONOSCOPY ESOPHAGOGASTRODUODENOSCOPY (EGD)  Patient Location: PACU  Anesthesia Type:General  Level of Consciousness: drowsy  Airway & Oxygen Therapy: Patient Spontanous Breathing  Post-op Assessment: Report given to RN and Post -op Vital signs reviewed and stable  Post vital signs: Reviewed and stable  Last Vitals:  Vitals Value Taken Time  BP 101/56 1023  Temp 35.9 1023  Pulse 78 1023  Resp 18 1023  SpO2 98 1023    Last Pain:  Vitals:   06/14/22 0849  TempSrc: Temporal  PainSc: 0-No pain         Complications: No notable events documented.

## 2022-06-14 NOTE — Interval H&P Note (Signed)
History and Physical Interval Note: Preprocedure H&P from 06/14/22  was reviewed and there was no interval change after seeing and examining the patient.  Written consent was obtained from the patient after discussion of risks, benefits, and alternatives. Patient has consented to proceed with Esophagogastroduodenoscopy and Colonoscopy with possible intervention   06/14/2022 9:35 AM  Tonya Bowen  has presented today for surgery, with the diagnosis of Colon cancer screening (Z12.11) Dysphagia, unspecified type (R13.10).  The various methods of treatment have been discussed with the patient and family. After consideration of risks, benefits and other options for treatment, the patient has consented to  Procedure(s): COLONOSCOPY (N/A) ESOPHAGOGASTRODUODENOSCOPY (EGD) (N/A) as a surgical intervention.  The patient's history has been reviewed, patient examined, no change in status, stable for surgery.  I have reviewed the patient's chart and labs.  Questions were answered to the patient's satisfaction.     Annamaria Helling

## 2022-06-15 ENCOUNTER — Encounter: Payer: Self-pay | Admitting: Gastroenterology

## 2022-06-15 LAB — SURGICAL PATHOLOGY

## 2022-06-17 ENCOUNTER — Encounter: Payer: Self-pay | Admitting: Oncology

## 2022-06-17 ENCOUNTER — Inpatient Hospital Stay: Payer: Medicare HMO

## 2022-06-17 ENCOUNTER — Inpatient Hospital Stay: Payer: Medicare HMO | Admitting: Oncology

## 2022-06-17 ENCOUNTER — Inpatient Hospital Stay: Payer: Medicare HMO | Attending: Oncology

## 2022-06-17 VITALS — BP 156/66 | HR 76 | Temp 96.0°F | Resp 18 | Wt 170.7 lb

## 2022-06-17 DIAGNOSIS — C50412 Malignant neoplasm of upper-outer quadrant of left female breast: Secondary | ICD-10-CM

## 2022-06-17 DIAGNOSIS — R300 Dysuria: Secondary | ICD-10-CM | POA: Diagnosis not present

## 2022-06-17 DIAGNOSIS — M8580 Other specified disorders of bone density and structure, unspecified site: Secondary | ICD-10-CM | POA: Diagnosis not present

## 2022-06-17 DIAGNOSIS — Z79811 Long term (current) use of aromatase inhibitors: Secondary | ICD-10-CM | POA: Insufficient documentation

## 2022-06-17 DIAGNOSIS — N390 Urinary tract infection, site not specified: Secondary | ICD-10-CM | POA: Insufficient documentation

## 2022-06-17 DIAGNOSIS — Z170 Estrogen receptor positive status [ER+]: Secondary | ICD-10-CM

## 2022-06-17 DIAGNOSIS — Z79899 Other long term (current) drug therapy: Secondary | ICD-10-CM | POA: Diagnosis not present

## 2022-06-17 DIAGNOSIS — N3000 Acute cystitis without hematuria: Secondary | ICD-10-CM | POA: Diagnosis not present

## 2022-06-17 LAB — URINALYSIS, COMPLETE (UACMP) WITH MICROSCOPIC
Bilirubin Urine: NEGATIVE
Glucose, UA: NEGATIVE mg/dL
Hgb urine dipstick: NEGATIVE
Ketones, ur: NEGATIVE mg/dL
Leukocytes,Ua: NEGATIVE
Nitrite: POSITIVE — AB
Protein, ur: 30 mg/dL — AB
Specific Gravity, Urine: 1.027 (ref 1.005–1.030)
pH: 5 (ref 5.0–8.0)

## 2022-06-17 LAB — COMPREHENSIVE METABOLIC PANEL
ALT: 10 U/L (ref 0–44)
AST: 20 U/L (ref 15–41)
Albumin: 3.8 g/dL (ref 3.5–5.0)
Alkaline Phosphatase: 52 U/L (ref 38–126)
Anion gap: 7 (ref 5–15)
BUN: 14 mg/dL (ref 8–23)
CO2: 25 mmol/L (ref 22–32)
Calcium: 8.6 mg/dL — ABNORMAL LOW (ref 8.9–10.3)
Chloride: 108 mmol/L (ref 98–111)
Creatinine, Ser: 1.07 mg/dL — ABNORMAL HIGH (ref 0.44–1.00)
GFR, Estimated: 54 mL/min — ABNORMAL LOW (ref >60)
Glucose, Bld: 99 mg/dL (ref 70–99)
Potassium: 3.5 mmol/L (ref 3.5–5.1)
Sodium: 140 mmol/L (ref 135–145)
Total Bilirubin: 0.4 mg/dL (ref 0.3–1.2)
Total Protein: 6.4 g/dL — ABNORMAL LOW (ref 6.5–8.1)

## 2022-06-17 LAB — CBC WITH DIFFERENTIAL/PLATELET
Abs Immature Granulocytes: 0.01 10*3/uL (ref 0.00–0.07)
Basophils Absolute: 0.1 10*3/uL (ref 0.0–0.1)
Basophils Relative: 1 %
Eosinophils Absolute: 0.2 10*3/uL (ref 0.0–0.5)
Eosinophils Relative: 2 %
HCT: 43.2 % (ref 36.0–46.0)
Hemoglobin: 14.2 g/dL (ref 12.0–15.0)
Immature Granulocytes: 0 %
Lymphocytes Relative: 13 %
Lymphs Abs: 0.9 10*3/uL (ref 0.7–4.0)
MCH: 30.5 pg (ref 26.0–34.0)
MCHC: 32.9 g/dL (ref 30.0–36.0)
MCV: 92.7 fL (ref 80.0–100.0)
Monocytes Absolute: 0.4 10*3/uL (ref 0.1–1.0)
Monocytes Relative: 6 %
Neutro Abs: 5.7 10*3/uL (ref 1.7–7.7)
Neutrophils Relative %: 78 %
Platelets: 253 10*3/uL (ref 150–400)
RBC: 4.66 MIL/uL (ref 3.87–5.11)
RDW: 11.9 % (ref 11.5–15.5)
WBC: 7.2 10*3/uL (ref 4.0–10.5)
nRBC: 0 % (ref 0.0–0.2)

## 2022-06-17 MED ORDER — CIPROFLOXACIN HCL 250 MG PO TABS: 250.0000 mg | ORAL_TABLET | Freq: Two times a day (BID) | ORAL | 0 refills | Status: DC

## 2022-06-17 MED ORDER — ANASTROZOLE 1 MG PO TABS: 1.0000 mg | ORAL_TABLET | Freq: Every day | ORAL | 3 refills | Status: DC

## 2022-06-17 NOTE — Assessment & Plan Note (Signed)
UA positive for nitrite  Recommend Cipro 250mg  BID x 5 days. Rx sent.  I sent a message to her mychart.

## 2022-06-17 NOTE — Assessment & Plan Note (Addendum)
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 s/p adjuvant radiation. Labs are reviewed and discussed with patient. Continue Arimidex 1mg  daily Continue annual mammogram bilaterally.  Left breast upper outer quadrant masslike tissue thickening, likely due to scar/cysts.  No evidence of cancer on mammogram.

## 2022-06-17 NOTE — Assessment & Plan Note (Signed)
Recommend calcium 1000-1200 mgand vitmain D supplementation.

## 2022-06-17 NOTE — Assessment & Plan Note (Addendum)
10/30/2019, DEXA showed osteopenia, 10-year risk of any major fracture 15%, 10-year risk of hip fracture 2.3%.  Continue calcium 1200 mg daily and vitamin D supplementation. Hold  Prolia today due to hypocalcemia.  Repeat BMP in 4 weeks +/- Prolia

## 2022-06-17 NOTE — Progress Notes (Signed)
Hematology/Oncology Progress note [phone] [phone]     CHIEF COMPLAINTS/REASON FOR VISIT:  Follow-up for breast cancer management   ASSESSMENT & PLAN:   Cancer Staging  Malignant neoplasm of upper-outer quadrant of left breast in female, estrogen receptor positive Staging form: Breast, AJCC 8th Edition - Clinical: Stage IB (cT2, cN0, cM0, G2, ER+, PR+, HER2-) - Signed by Earlie Server, MD on 01/03/2020 - Pathologic stage from 02/04/2020: Stage IA (pT2, pN0, cM0, G2, ER+, PR+, HER2-, Oncotype DX score: 5) - Signed by Earlie Server, MD on 12/16/2021   Malignant neoplasm of upper-outer quadrant of left breast in female, estrogen receptor positive ([place]) 01/2020 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 s/p adjuvant radiation. Labs are reviewed and discussed with patient. Continue Arimidex 1mg  daily Continue annual mammogram bilaterally.  Left breast upper outer quadrant masslike tissue thickening, likely due to scar/cysts.  No evidence of cancer on mammogram.  Osteopenia 10/30/2019, DEXA showed osteopenia, 10-year risk of any major fracture 15%, 10-year risk of hip fracture 2.3%.  Continue calcium 1200 mg daily and vitamin D supplementation. Hold  Prolia today due to hypocalcemia.  Repeat BMP in 4 weeks +/- Prolia   Hypocalcemia Recommend calcium 1000-1200 mgand vitmain D supplementation.   Orders Placed This Encounter  Procedures   Urine Culture    Standing Status:   Future    Number of Occurrences:   1    Standing Expiration Date:   06/17/2023   [place] LIMITED ULTRASOUND INCLUDING AXILLA LEFT BREAST     Standing Status:   Future    Standing Expiration Date:   06/17/2023    Order Specific Question:   Reason for Exam (SYMPTOM  OR DIAGNOSIS REQUIRED)    Answer:   Breast cancer    Order Specific Question:   Preferred imaging location?    Answer:   [facility]   [place] LIMITED  ULTRASOUND INCLUDING AXILLA RIGHT BREAST    Standing Status:   Future    Standing Expiration Date:   06/17/2023    Order Specific Question:   Reason for Exam (SYMPTOM  OR DIAGNOSIS REQUIRED)    Answer:   Breast cancer    Order Specific Question:   Preferred imaging location?    Answer:   [facility]   MM 3D DIAGNOSTIC MAMMOGRAM BILATERAL BREAST W/IMPLANT    Standing Status:   Future    Standing Expiration Date:   06/17/2023    Order Specific Question:   Reason for Exam (SYMPTOM  OR DIAGNOSIS REQUIRED)    Answer:   Breast cancer    Order Specific Question:   Preferred imaging location?    Answer:   [place]   Urinalysis, Complete w Microscopic    Standing Status:   Future    Number of Occurrences:   1    Standing Expiration Date:   [id number]   Basic Metabolic Panel - [place] Only    Standing Status:   Future    Standing Expiration Date:   06/17/2023   CBC with Differential ([place] Only)    Standing Status:   Future    Standing Expiration Date:   06/17/2023   CMP ([place] only)    Standing Status:   Future    Standing Expiration Date:   06/17/2023   Follow up in 6 months.  All questions were answered. The patient knows to call the clinic with any problems, questions or  concerns.  Earlie Server, MD, PhD [facility] Hematology Oncology 06/17/2022    HISTORY OF PRESENTING ILLNESS:   Tonya Bowen is a  77 y.o.  female with presents for follow up of evaluation of breast cancer Oncology History  Malignant neoplasm of upper-outer quadrant of left breast in female, estrogen receptor positive  12/19/2019 Mammogram   Unilateral left breast diagnostic mammogram showed  25mm irregular left upper outer breast mass, no left axillary lymphadenopathy.   01/03/2020 Initial Diagnosis   Malignant neoplasm of upper-outer quadrant of left breast in female, estrogen receptor positive  Family history of breast cancer: paternal aunt x 2 breast cancer Family history of other  cancers: brother lung cancer, mother possible uterine cancer Menarche: 63 Menopause: hystectomy in 10s Age at first live childbirth:23 Used OCP: about 10 years Used estrogen and progesterone therapy: 20+ years HRT History of Radiation to the chest: none Previous of breast biopsy: none   01/03/2020 Cancer Staging   Staging form: Breast, AJCC 8th Edition - Clinical: Stage IB (cT2, cN0, cM0, G2, ER+, PR+, HER2-) - Signed by Earlie Server, MD on 01/03/2020   01/21/2020 Genetic Testing   Negative genetic testing. No pathogenic variants identified on the [facility] Common Hereditary Cancers Panel +EGFR. The report date is 01/21/2020.  The Common Hereditary Cancers Panel offered by [facility] includes sequencing and/or deletion duplication testing of the following 48 genes: APC, ATM, AXIN2, BARD1, BMPR1A, BRCA1, BRCA2, BRIP1, CDH1, CDKN2A (p14ARF), CDKN2A (p16INK4a), CKD4, CHEK2, CTNNA1, DICER1, EGFR,  EPCAM (Deletion/duplication testing only), GREM1 (promoter region deletion/duplication testing only), KIT, MEN1, MLH1, MSH2, MSH3, MSH6, MUTYH, NBN, NF1, NHTL1, PALB2, PDGFRA, PMS2, POLD1, POLE, PTEN, RAD50, RAD51C, RAD51D, RNF43, SDHB, SDHC, SDHD, SMAD4, SMARCA4. STK11, TP53, TSC1, TSC2, and VHL.  The following genes were evaluated for sequence changes only: SDHA and HOXB13 c.251G>A variant only.   02/04/2020 Cancer Staging   Staging form: Breast, AJCC 8th Edition - Pathologic stage from 02/04/2020: Stage IA (pT2, pN0, cM0, G2, ER+, PR+, HER2-, Oncotype DX score: 5) - Signed by Earlie Server, MD on 12/16/2021 Stage prefix: Initial diagnosis Multigene prognostic tests performed: Oncotype DX Recurrence score range: Less than 11 Histologic grading system: 3 grade system    - 04/29/2020 Radiation Therapy   finished adjuvant radiation therapy   05/14/2021 -  Anti-estrogen oral therapy   started on Arimidex   07/03/2021 Mammogram   Unilateral diagnostic mammogram /ultrasound left-for evaluation of left upper outer  quadrant breast mass 1. No evidence of new or recurrent breast carcinoma. 2. Clinical and imaging findings are consistent with ongoing fat necrosis throughout the surgical bed    Imaging     12/07/2021 Mammogram   Bilateral diagnostic mammogram/implant abdominal bilateral -No evidence of malignancy in either breast     She has balance difficulties and she was recently seen by neurology Dr. Manuella Ghazi on 06/04/2020.  Patient had work-up including SPEP and immunofixation.  Labs reviewed.  No M protein was identified.  INTERVAL HISTORY Tonya Bowen is a 77 y.o. female who has above history reviewed by me today presents for follow up visit for management of left breast cancer, pT2N0 , ER/PR positive, HER-2 negative. Patient takes anastrozole 1 mg daily.  Overall tolerates well, manageable side effects. She reports dysuria, difficulty voiding with a need to "really push it out." Patient reports the onset of symptoms started yesterday.- Describes a sensation of pain that "sends a shiver up my spine" when able to urinate. - Denies experiencing fever, chills, or flank pain. -  Reports a history of approximately "3 or 4" UTIs per year.   Review of Systems  Constitutional:  Negative for appetite change, chills, fatigue and fever.  HENT:   Negative for hearing loss and voice change.   Eyes:  Negative for eye problems.  Respiratory:  Negative for chest tightness and cough.   Cardiovascular:  Negative for chest pain.  Gastrointestinal:  Negative for abdominal distention, abdominal pain and blood in stool.  Endocrine: Negative for hot flashes.  Genitourinary:  Positive for difficulty urinating and dysuria. Negative for frequency.   Musculoskeletal:  Positive for arthralgias and back pain.  Skin:  Negative for itching and rash.  Neurological:  Negative for extremity weakness.  Hematological:  Negative for adenopathy.  Psychiatric/Behavioral:  Negative for confusion.     MEDICAL HISTORY:  Past Medical  History:  Diagnosis Date   Arthritis    Cancer 12/27/2019   left breast   Chronic back pain    Complication of anesthesia    pt states she felt every thing during breast augmentation surgery   Diabetes mellitus without complication    Family history of breast cancer    Family history of lung cancer    Family history of uterine cancer    GERD (gastroesophageal reflux disease)    Hypertension    Stomach ulcer     SURGICAL HISTORY: Past Surgical History:  Procedure Laterality Date   ABDOMINAL HYSTERECTOMY     AUGMENTATION MAMMAPLASTY Bilateral 1987   BREAST ENHANCEMENT SURGERY     BREAST LUMPECTOMY Left 01/2020   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   BREAST LUMPECTOMY WITH SENTINEL LYMPH NODE BIOPSY Left 02/04/2020   Procedure: BREAST LUMPECTOMY WITH SENTINEL LYMPH NODE BX;  Surgeon: Robert Bellow, MD;  Location: [facility] ORS;  Service: General;  Laterality: Left;   BUNIONECTOMY Bilateral    Great toes   CATARACT EXTRACTION Left    colonoscopy     COLONOSCOPY N/A 06/14/2022   Procedure: COLONOSCOPY;  Surgeon: Annamaria Helling, DO;  Location: [facility] ENDOSCOPY;  Service: Gastroenterology;  Laterality: N/A;   ESOPHAGOGASTRODUODENOSCOPY N/A 06/14/2022   Procedure: ESOPHAGOGASTRODUODENOSCOPY (EGD);  Surgeon: Annamaria Helling, DO;  Location: [facility] ENDOSCOPY;  Service: Gastroenterology;  Laterality: N/A;   GLAUCOMA SURGERY  01/26/2022   KYPHOPLASTY N/A 02/06/2014   Procedure:  L2 KYPHOPLASTY;  Surgeon: Melina Schools, MD;  Location: [facility];  Service: Orthopedics;  Laterality: N/A;   RADIAL KERATOTOMY     TONSILLECTOMY     UPPER GI ENDOSCOPY      SOCIAL HISTORY: Social History   Socioeconomic History   Marital status: Divorced    Spouse name: Not on file   Number of children: Not on file   Years of education: Not on file   Highest education level: Not on file  Occupational History   Not on file  Tobacco Use   Smoking status: Never   Smokeless  tobacco: Never  Vaping Use   Vaping Use: Never used  Substance and Sexual Activity   Alcohol use: Yes    Alcohol/week: 2.0 standard drinks of alcohol    Types: 2 Glasses of wine per week    Comment: socially   Drug use: No   Sexual activity: Not Currently  Other Topics Concern   Not on file  Social History Narrative   Not on file   Social Determinants of Health   Financial Resource Strain: Not on file  Food Insecurity: Not on file  Transportation Needs: Not on file  Physical Activity: Not on file  Stress: Not on file  Social Connections: Not on file  Intimate Partner Violence: Not on file    FAMILY HISTORY: Family History  Problem Relation Age of Onset   Breast cancer Paternal Aunt    Breast cancer Paternal Aunt    Uterine cancer Mother        probable   Lung cancer Brother        metastatic disease; unsure if lung was pimary   Breast cancer Paternal 46    ALS Daughter    Breast cancer Cousin        dx 61s   Breast cancer Cousin        dx 31s   Breast cancer Cousin        dx 53s   Lung cancer Cousin     ALLERGIES:  has No Known Allergies.  MEDICATIONS:  Current Outpatient Medications  Medication Sig Dispense Refill   acetaminophen (TYLENOL) 325 MG tablet Take 650 mg by mouth every 6 (six) hours as needed for moderate pain.      brimonidine (ALPHAGAN) 0.2 % ophthalmic solution 1 drop 3 (three) times daily.     Semaglutide (OZEMPIC, 0.25 OR 0.5 MG/DOSE, [place]) Inject 0.25 mg into the skin once a week.      anastrozole (ARIMIDEX) 1 MG tablet Take 1 tablet (1 mg total) by mouth daily. 90 tablet 3   atorvastatin (LIPITOR) 40 MG tablet  (Patient not taking: Reported on 06/17/2022)     calcium-vitamin D (OSCAL WITH D) 250-125 MG-UNIT tablet Take 1 tablet by mouth daily. (Patient not taking: Reported on 05/31/2022)     cetirizine (ZYRTEC) 10 MG tablet Take 10 mg by mouth every morning.  (Patient not taking: Reported on 05/31/2022)     Cyanocobalamin (VITAMIN B 12 PO) Take  by mouth. (Patient not taking: Reported on 05/31/2022)     dorzolamide-timolol (COSOPT) 22.3-6.8 MG/ML ophthalmic solution Place 1 drop into the right eye 2 (two) times daily. (Patient not taking: Reported on 05/31/2022)     gabapentin (NEURONTIN) 300 MG capsule Take 1 capsule (300 mg total) by mouth 3 (three) times daily. (Patient not taking: Reported on 05/31/2022) 90 capsule 5   lisinopril-hydrochlorothiazide (ZESTORETIC) 20-12.5 MG tablet Take 1 tablet by mouth every morning.  (Patient not taking: Reported on 05/31/2022)     omeprazole (PRILOSEC) 40 MG capsule Take 1 tablet by mouth every morning.  (Patient not taking: Reported on 05/31/2022)     predniSONE (DELTASONE) 20 MG tablet Take 20 mg by mouth 3 (three) times daily. (Patient not taking: Reported on 05/31/2022)     vitamin C (ASCORBIC ACID) 500 MG tablet Take 500 mg by mouth daily. (Patient not taking: Reported on 05/31/2022)     Vitamin D, Ergocalciferol, (DRISDOL) 1.25 MG (50000 UT) CAPS capsule Take 50,000 Units by mouth every 7 (seven) days.  (Patient not taking: Reported on 05/31/2022)     No current facility-administered medications for this visit.     PHYSICAL EXAMINATION: ECOG PERFORMANCE STATUS: 1 - Symptomatic but completely ambulatory Vitals:   06/17/22 1403  BP: (!) 156/66  Pulse: 76  Resp: 18  Temp: (!) 96 F (35.6 C)  SpO2: 97%   Filed Weights   06/17/22 1403  Weight: 170 lb 11.2 oz (77.4 kg)    Physical Exam Constitutional:      General: She is not in acute distress. HENT:     Head: Normocephalic and atraumatic.  Eyes:  General: No scleral icterus. Cardiovascular:     Rate and Rhythm: Normal rate and regular rhythm.     Heart sounds: Normal heart sounds.  Pulmonary:     Effort: Pulmonary effort is normal. No respiratory distress.     Breath sounds: No wheezing.  Abdominal:     General: Bowel sounds are normal. There is no distension.     Palpations: Abdomen is soft.  Musculoskeletal:        General:  No deformity. Normal range of motion.     Cervical back: Normal range of motion and neck supple.  Skin:    General: Skin is warm and dry.     Findings: No erythema or rash.  Neurological:     Mental Status: She is alert and oriented to person, place, and time. Mental status is at baseline.     Cranial Nerves: No cranial nerve deficit.     Coordination: Coordination normal.  Psychiatric:        Mood and Affect: Mood normal.    Breast exam was performed in seated and lying down position. Patient is status post left lumpectomy with a well-healed surgical scar.  History of breast augmentation.  There is palpable area of tissue thickening at the site of lumpectomy. No discrete mass palpable in right breast.  No palpable lymphadenopathy bilateral axillary  LABORATORY DATA:  I have reviewed the data as listed    Latest Ref Rng & Units 06/17/2022    1:47 PM 12/16/2021    1:14 PM 12/09/2021    4:35 PM  CBC  WBC 4.0 - 10.5 K/uL 7.2  11.0  8.3   Hemoglobin 12.0 - 15.0 g/dL 14.2  12.8  12.5   Hematocrit 36.0 - 46.0 % 43.2  37.3  38.2   Platelets 150 - 400 K/uL 253  262  280       Latest Ref Rng & Units 06/17/2022    1:47 PM 12/16/2021    1:14 PM 12/09/2021    4:35 PM  CMP  Glucose 70 - 99 mg/dL 99  130    BUN 8 - 23 mg/dL 14  32    Creatinine 0.44 - 1.00 mg/dL 1.07  1.34  1.14   Sodium 135 - 145 mmol/L 140  139    Potassium 3.5 - 5.1 mmol/L 3.5  4.1    Chloride 98 - 111 mmol/L 108  105    CO2 22 - 32 mmol/L 25  28    Calcium 8.9 - 10.3 mg/dL 8.6  8.7    Total Protein 6.5 - 8.1 g/dL 6.4  5.9    Total Bilirubin 0.3 - 1.2 mg/dL 0.4  0.5    Alkaline Phos 38 - 126 U/L 52  55    AST 15 - 41 U/L 20  18    ALT 0 - 44 U/L 10  15       RADIOGRAPHIC STUDIES: I have personally reviewed the radiological images as listed and agreed with the findings in the report. No results found.

## 2022-06-19 LAB — URINE CULTURE: Culture: >=100000 — AB

## 2022-06-23 IMAGING — MG MM BREAST SURGICAL SPECIMEN
1 series · 1 of 1 positions shown · non-contrast
Comparison: Previous exam(s).

CLINICAL DATA: Post lumpectomy specimen radiograph. 74-year-old
female with recently diagnosed invasive mammary carcinoma.

EXAM:
SPECIMEN RADIOGRAPH OF THE LEFT BREAST

[L SPECIMEN]
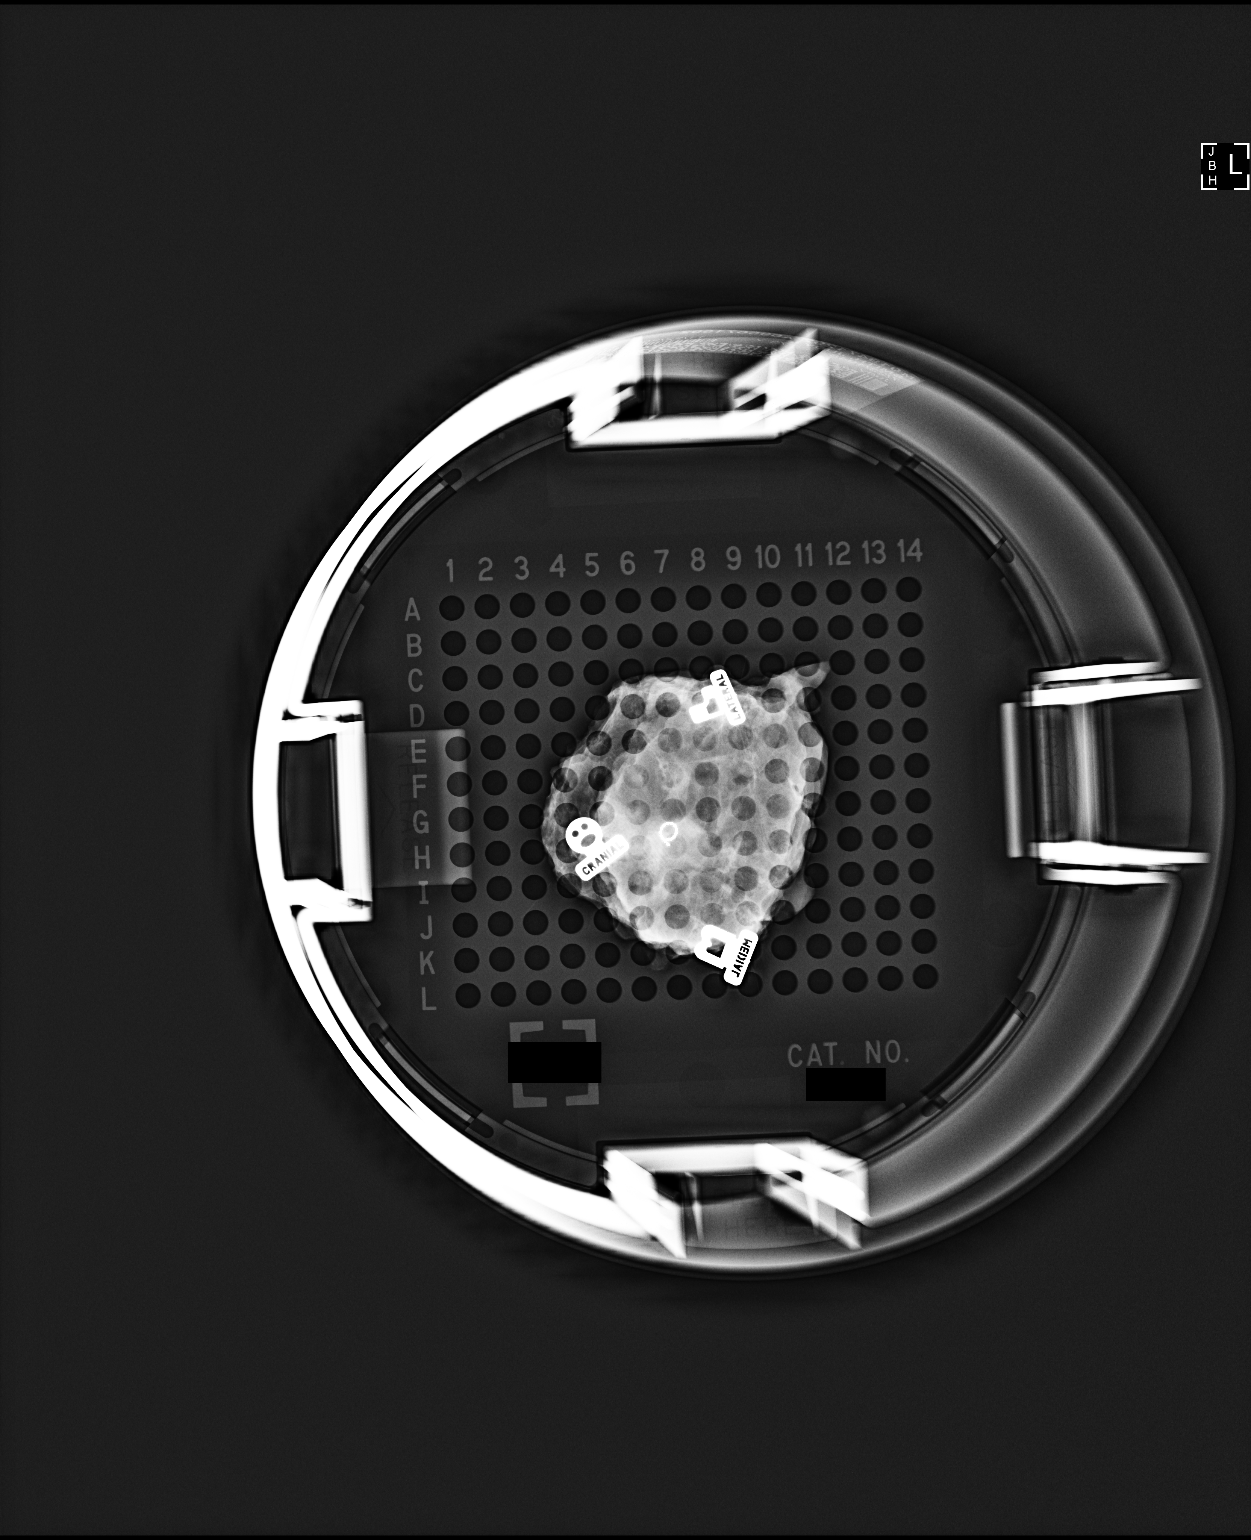

[1 of 1 positions shown; findings below may reference images not displayed]

FINDINGS: Status post excision of the left breast. The Q biopsy marker clip is
present within the specimen.
IMPRESSION: Specimen radiograph of the left breast.

This was confirmed with Dr. Ceola at [DATE] p.m. on 02/04/2020.

## 2022-06-29 DIAGNOSIS — B372 Candidiasis of skin and nail: Secondary | ICD-10-CM | POA: Diagnosis not present

## 2022-06-29 DIAGNOSIS — B354 Tinea corporis: Secondary | ICD-10-CM | POA: Diagnosis not present

## 2022-06-30 DIAGNOSIS — E7800 Pure hypercholesterolemia, unspecified: Secondary | ICD-10-CM | POA: Diagnosis not present

## 2022-06-30 DIAGNOSIS — E663 Overweight: Secondary | ICD-10-CM | POA: Diagnosis not present

## 2022-06-30 DIAGNOSIS — E1122 Type 2 diabetes mellitus with diabetic chronic kidney disease: Secondary | ICD-10-CM | POA: Diagnosis not present

## 2022-06-30 DIAGNOSIS — I10 Essential (primary) hypertension: Secondary | ICD-10-CM | POA: Diagnosis not present

## 2022-06-30 DIAGNOSIS — N1831 Chronic kidney disease, stage 3a: Secondary | ICD-10-CM | POA: Diagnosis not present

## 2022-07-13 DIAGNOSIS — H40041 Steroid responder, right eye: Secondary | ICD-10-CM | POA: Diagnosis not present

## 2022-07-13 DIAGNOSIS — H35351 Cystoid macular degeneration, right eye: Secondary | ICD-10-CM | POA: Diagnosis not present

## 2022-07-13 DIAGNOSIS — H4010X0 Unspecified open-angle glaucoma, stage unspecified: Secondary | ICD-10-CM | POA: Diagnosis not present

## 2022-07-15 ENCOUNTER — Inpatient Hospital Stay: Payer: Medicare HMO | Attending: Oncology

## 2022-07-15 ENCOUNTER — Inpatient Hospital Stay: Payer: Medicare HMO

## 2022-07-15 ENCOUNTER — Other Ambulatory Visit: Payer: Self-pay

## 2022-07-15 DIAGNOSIS — M8580 Other specified disorders of bone density and structure, unspecified site: Secondary | ICD-10-CM | POA: Insufficient documentation

## 2022-07-15 DIAGNOSIS — Z79811 Long term (current) use of aromatase inhibitors: Secondary | ICD-10-CM | POA: Diagnosis not present

## 2022-07-15 DIAGNOSIS — Z170 Estrogen receptor positive status [ER+]: Secondary | ICD-10-CM | POA: Insufficient documentation

## 2022-07-15 DIAGNOSIS — C50412 Malignant neoplasm of upper-outer quadrant of left female breast: Secondary | ICD-10-CM | POA: Insufficient documentation

## 2022-07-15 LAB — CBC WITH DIFFERENTIAL (CANCER CENTER ONLY)
Abs Immature Granulocytes: 0.03 10*3/uL (ref 0.00–0.07)
Basophils Absolute: 0 10*3/uL (ref 0.0–0.1)
Basophils Relative: 1 %
Eosinophils Absolute: 0.3 10*3/uL (ref 0.0–0.5)
Eosinophils Relative: 3 %
HCT: 43.1 % (ref 36.0–46.0)
Hemoglobin: 14.1 g/dL (ref 12.0–15.0)
Immature Granulocytes: 0 %
Lymphocytes Relative: 10 %
Lymphs Abs: 0.9 10*3/uL (ref 0.7–4.0)
MCH: 30.2 pg (ref 26.0–34.0)
MCHC: 32.7 g/dL (ref 30.0–36.0)
MCV: 92.3 fL (ref 80.0–100.0)
Monocytes Absolute: 0.4 10*3/uL (ref 0.1–1.0)
Monocytes Relative: 5 %
Neutro Abs: 6.9 10*3/uL (ref 1.7–7.7)
Neutrophils Relative %: 81 %
Platelet Count: 281 10*3/uL (ref 150–400)
RBC: 4.67 MIL/uL (ref 3.87–5.11)
RDW: 12.8 % (ref 11.5–15.5)
WBC Count: 8.5 10*3/uL (ref 4.0–10.5)
nRBC: 0 % (ref 0.0–0.2)

## 2022-07-15 LAB — BASIC METABOLIC PANEL - CANCER CENTER ONLY
Anion gap: 8 (ref 5–15)
BUN: 19 mg/dL (ref 8–23)
CO2: 26 mmol/L (ref 22–32)
Calcium: 8.5 mg/dL — ABNORMAL LOW (ref 8.9–10.3)
Chloride: 108 mmol/L (ref 98–111)
Creatinine: 1.02 mg/dL — ABNORMAL HIGH (ref 0.44–1.00)
GFR, Estimated: 57 mL/min — ABNORMAL LOW (ref >60)
Glucose, Bld: 115 mg/dL — ABNORMAL HIGH (ref 70–99)
Potassium: 4.1 mmol/L (ref 3.5–5.1)
Sodium: 142 mmol/L (ref 135–145)

## 2022-07-15 MED ORDER — DENOSUMAB 60 MG/ML ~~LOC~~ SOSY: 60.0000 mg | PREFILLED_SYRINGE | Freq: Once | SUBCUTANEOUS | Status: DC

## 2022-07-15 NOTE — Addendum Note (Signed)
Addended by: Rueben Bash on: 07/15/2022 01:29 PM   Modules accepted: Orders

## 2022-07-15 NOTE — Progress Notes (Signed)
Patient her for Prolia injection, calcium is 8.5 today. Per Dr. Cathie Hoops, hold prolia today.

## 2022-07-16 LAB — VITAMIN D 25 HYDROXY (VIT D DEFICIENCY, FRACTURES): Vit D, 25-Hydroxy: 44.49 ng/mL (ref 30–100)

## 2022-08-26 ENCOUNTER — Encounter: Payer: Self-pay | Admitting: Student in an Organized Health Care Education/Training Program

## 2022-08-26 ENCOUNTER — Ambulatory Visit
Payer: Medicare HMO | Attending: Student in an Organized Health Care Education/Training Program | Admitting: Student in an Organized Health Care Education/Training Program

## 2022-08-26 VITALS — BP 109/58 | HR 78 | Temp 97.9°F | Resp 16 | Ht 64.5 in | Wt 172.0 lb

## 2022-08-26 DIAGNOSIS — G8929 Other chronic pain: Secondary | ICD-10-CM | POA: Diagnosis not present

## 2022-08-26 DIAGNOSIS — G894 Chronic pain syndrome: Secondary | ICD-10-CM | POA: Diagnosis not present

## 2022-08-26 DIAGNOSIS — Z9889 Other specified postprocedural states: Secondary | ICD-10-CM | POA: Diagnosis not present

## 2022-08-26 DIAGNOSIS — M5136 Other intervertebral disc degeneration, lumbar region: Secondary | ICD-10-CM | POA: Diagnosis not present

## 2022-08-26 DIAGNOSIS — M546 Pain in thoracic spine: Secondary | ICD-10-CM | POA: Insufficient documentation

## 2022-08-26 DIAGNOSIS — M47816 Spondylosis without myelopathy or radiculopathy, lumbar region: Secondary | ICD-10-CM | POA: Diagnosis not present

## 2022-08-26 MED ORDER — GABAPENTIN 300 MG PO CAPS: 300.0000 mg | ORAL_CAPSULE | Freq: Three times a day (TID) | ORAL | 5 refills | Status: DC

## 2022-08-26 NOTE — Progress Notes (Signed)
Safety precautions to be maintained throughout the outpatient stay will include: orient to surroundings, keep bed in low position, maintain call bell within reach at all times, provide assistance with transfer out of bed and ambulation.  

## 2022-08-26 NOTE — Progress Notes (Signed)
PROVIDER NOTE: Information contained herein reflects review and annotations entered in association with encounter. Interpretation of such information and data should be left to medically-trained personnel. Information provided to patient can be located elsewhere in the medical record under "Patient Instructions". Document created using STT-dictation technology, any transcriptional errors that may result from process are unintentional.    Patient: Tonya Bowen  Service Category: E/M  Provider: Edward Jolly, MD  DOB: 05/30/1945  DOS: 09/04/2020  Specialty: Interventional Pain Management  MRN: [id number]  Setting: Ambulatory outpatient  PCP: Dorothey Baseman, MD  Type: Established Patient    Referring Provider: Dorothey Baseman, MD  Location: Office  Delivery: Face-to-face     HPI  Tonya Bowen, a 77 y.o. year old female, is here today because of her S/P kyphoplasty [Z98.890]. Tonya Bowen primary complain today is Back Pain  Last encounter: My last encounter with her was on 03/02/22 Pertinent problems: Tonya Bowen has S/P kyphoplasty; Chronic midline thoracic back pain; Lumbar degenerative disc disease; Lumbar facet arthropathy; Chronic pain syndrome; and Malignant neoplasm of upper-outer quadrant of left breast in female, estrogen receptor positive (HCC) on their pertinent problem list. Pain Assessment: Severity of Chronic pain is reported as a 5 /10.  Low back pain which she describes as throbbing, does get worse in the evening and with facet loading. Vitals:  height is 5' 4.5" (1.638 m) and weight is 172 lb (78 kg). Her temporal temperature is 97.9 F (36.6 C). Her blood pressure is 109/58 (abnormal) and her pulse is 78. Her respiration is 16 and oxygen saturation is 100%.   Reason for encounter: medication management.    Patient follows up today for management of gabapentin.   No change in medical history since last visit.  Patient's pain is at baseline.  Patient continues multimodal pain  regimen as prescribed.  States that it provides pain relief and improvement in functional status.   ROS  Constitutional: Denies any fever or chills Gastrointestinal: No reported hemesis, hematochezia, vomiting, or acute GI distress Musculoskeletal:  Positive low back pain Neurological: No reported episodes of acute onset apraxia, aphasia, dysarthria, agnosia, amnesia, paralysis, loss of coordination, or loss of consciousness  Medication Review  Semaglutide, acetaminophen, anastrozole, cetirizine, dorzolamide-timolol, and gabapentin  History Review  Allergy: Tonya Bowen has No Known Allergies. Drug: Tonya Bowen  reports no history of drug use. Alcohol:  reports current alcohol use of about 2.0 standard drinks of alcohol per week. Tobacco:  reports that she has never smoked. She has never used smokeless tobacco. Social: Tonya Bowen  reports that she has never smoked. She has never used smokeless tobacco. She reports current alcohol use of about 2.0 standard drinks of alcohol per week. She reports that she does not use drugs. Medical:  has a past medical history of Arthritis, Cancer (HCC) (12/27/2019), Chronic back pain, Complication of anesthesia, Diabetes mellitus without complication (HCC), Family history of breast cancer, Family history of lung cancer, Family history of uterine cancer, GERD (gastroesophageal reflux disease), Hypertension, and Stomach ulcer. Surgical: Tonya Bowen  has a past surgical history that includes Abdominal hysterectomy; Tonsillectomy; Bunionectomy (Bilateral); Breast enhancement surgery; Cataract extraction (Left); Radial keratotomy; colonoscopy; Upper gi endoscopy; Kyphoplasty (N/A, 02/06/2014); Augmentation mammaplasty (Bilateral, 1987); Breast lumpectomy with sentinel lymph node bx (Left, 02/04/2020); Breast lumpectomy (Left, 01/2020); Glaucoma surgery (01/26/2022); Colonoscopy (N/A, 06/14/2022); and Esophagogastroduodenoscopy (N/A, 06/14/2022). Family: family history includes  ALS in her daughter; Breast cancer in her cousin, cousin, cousin, paternal aunt, paternal aunt, and paternal  aunt; Lung cancer in her brother and cousin; Uterine cancer in her mother.  Laboratory Chemistry Profile   Renal Lab Results  Component Value Date   BUN 19 07/15/2022   CREATININE 1.02 (H) 07/15/2022   GFRAA >90 01/30/2014   GFRNONAA 57 (L) 07/15/2022     Hepatic Lab Results  Component Value Date   AST 20 06/17/2022   ALT 10 06/17/2022   ALBUMIN 3.8 06/17/2022   ALKPHOS 52 06/17/2022     Electrolytes Lab Results  Component Value Date   NA 142 07/15/2022   K 4.1 07/15/2022   CL 108 07/15/2022   CALCIUM 8.5 (L) 07/15/2022     Bone Lab Results  Component Value Date   VD25OH 44.49 07/15/2022     Inflammation (CRP: Acute Phase) (ESR: Chronic Phase) Lab Results  Component Value Date   CRP 0.6 12/09/2021   ESRSEDRATE 15 12/09/2021       Note: Above Lab results reviewed.  Recent Imaging Review  US BREAST LTD UNI LEFT INC AXILLA CLINICAL DATA:  77 year old female with history of left breast cancer status post lumpectomy and radiation therapy in 2021. Patient complains of firmness in the lumpectomy site with a discrete palpable mass.  EXAM: DIGITAL DIAGNOSTIC BILATERAL MAMMOGRAM WITH IMPLANTS, TOMOSYNTHESIS; ULTRASOUND LEFT BREAST LIMITED  TECHNIQUE: Bilateral digital diagnostic mammography and breast tomosynthesis was performed. Standard and/or implant displaced views were performed.; Targeted ultrasound examination of the left breast was performed.  COMPARISON:  Previous exam(s).  ACR Breast Density Category b: There are scattered areas of fibroglandular density.  FINDINGS: Lumpectomy changes with trabecular thickening is seen in the upper-outer quadrant of the left breast. Calcified oil cysts are more prominent than the prior exam dated 07/03/2021. No suspicious mass or malignant type microcalcifications identified in either breast. The patient  has retroglandular implants.  I palpate a discrete mass in the left breast at 12 o'clock 5 cm from the nipple.  Targeted ultrasound is performed, showing an oil cyst in the left breast at 12 o'clock 5 cm from the nipple measuring 1.3 x 0.9 x 1.1 cm. It is unchanged from the prior exam dated 07/03/2021. No suspicious mass identified at the lumpectomy site.  IMPRESSION: No evidence of malignancy in either breast.  RECOMMENDATION: Bilateral diagnostic mammogram in 1 year is recommended.  I have discussed the findings and recommendations with the patient. If applicable, a reminder letter will be sent to the patient regarding the next appointment.  BI-RADS CATEGORY  2: Benign.  Electronically Signed   By: Baird Lyons M.D.   On: 12/07/2021 16:04 MM DIAG BREAST W/IMPLANT TOMO BILATERAL CLINICAL DATA:  77 year old female with history of left breast cancer status post lumpectomy and radiation therapy in 2021. Patient complains of firmness in the lumpectomy site with a discrete palpable mass.  EXAM: DIGITAL DIAGNOSTIC BILATERAL MAMMOGRAM WITH IMPLANTS, TOMOSYNTHESIS; ULTRASOUND LEFT BREAST LIMITED  TECHNIQUE: Bilateral digital diagnostic mammography and breast tomosynthesis was performed. Standard and/or implant displaced views were performed.; Targeted ultrasound examination of the left breast was performed.  COMPARISON:  Previous exam(s).  ACR Breast Density Category b: There are scattered areas of fibroglandular density.  FINDINGS: Lumpectomy changes with trabecular thickening is seen in the upper-outer quadrant of the left breast. Calcified oil cysts are more prominent than the prior exam dated 07/03/2021. No suspicious mass or malignant type microcalcifications identified in either breast. The patient has retroglandular implants.  I palpate a discrete mass in the left breast at 12 o'clock 5 cm from the  nipple.  Targeted ultrasound is performed, showing an oil cyst  in the left breast at 12 o'clock 5 cm from the nipple measuring 1.3 x 0.9 x 1.1 cm. It is unchanged from the prior exam dated 07/03/2021. No suspicious mass identified at the lumpectomy site.  IMPRESSION: No evidence of malignancy in either breast.  RECOMMENDATION: Bilateral diagnostic mammogram in 1 year is recommended.  I have discussed the findings and recommendations with the patient. If applicable, a reminder letter will be sent to the patient regarding the next appointment.  BI-RADS CATEGORY  2: Benign.  Electronically Signed   By: Baird Lyons M.D.   On: 12/07/2021 16:04  Note: Reviewed        Physical Exam  General appearance: Well nourished, well developed, and well hydrated. In no apparent acute distress Mental status: Alert, oriented x 3 (person, place, & time)       Respiratory: No evidence of acute respiratory distress Eyes: PERLA Vitals: BP (!) 109/58   Pulse 78   Temp 97.9 F (36.6 C) (Temporal)   Resp 16   Ht 5' 4.5" (1.638 m)   Wt 172 lb (78 kg)   SpO2 100%   BMI 29.07 kg/m  BMI: Estimated body mass index is 29.07 kg/m as calculated from the following:   Height as of this encounter: 5' 4.5" (1.638 m).   Weight as of this encounter: 172 lb (78 kg). Ideal: Ideal body weight: 55.8 kg (123 lb 2 oz) Adjusted ideal body weight: 64.7 kg (142 lb 10.8 oz)    Lumbar Exam  Skin & Axial Inspection: No masses, redness, or swelling Alignment: Symmetrical Functional ROM: Pain restricted ROM       Stability: No instability detected Muscle Tone/Strength: Functionally intact. No obvious neuro-muscular anomalies detected. Sensory (Neurological): Musculoskeletal pain pattern Palpation: Complains of area being tender to palpation             Lower Extremity Exam      Side: Right lower extremity   Side: Left lower extremity  Stability: No instability observed           Stability: No instability observed          Skin & Extremity Inspection: Skin color,  temperature, and hair growth are WNL. No peripheral edema or cyanosis. No masses, redness, swelling, asymmetry, or associated skin lesions. No contractures.   Skin & Extremity Inspection: Skin color, temperature, and hair growth are WNL. No peripheral edema or cyanosis. No masses, redness, swelling, asymmetry, or associated skin lesions. No contractures.  Functional ROM: Unrestricted ROM                   Functional ROM: Unrestricted ROM                  Muscle Tone/Strength: Functionally intact. No obvious neuro-muscular anomalies detected.   Muscle Tone/Strength: Functionally intact. No obvious neuro-muscular anomalies detected.  Sensory (Neurological): Unimpaired         Sensory (Neurological): Unimpaired        DTR: Patellar: deferred today Achilles: deferred today Plantar: deferred today   DTR: Patellar: deferred today Achilles: deferred today Plantar: deferred today  Palpation: No palpable anomalies   Palpation: No palpable anomalies      Assessment   Status Diagnosis  Controlled Controlled Controlled 1. S/P kyphoplasty (L2 done in 2015 with Dr Shon Baton)   2. Lumbar degenerative disc disease   3. Lumbar facet arthropathy   4. Other intervertebral disc degeneration, lumbar region  5. Chronic midline thoracic back pain   6. Chronic pain syndrome         Plan of Care  Tonya Bowen has a current medication list which includes the following long-term medication(s): cetirizine and gabapentin.  Pharmacotherapy (Medications Ordered): Meds ordered this encounter  Medications   gabapentin (NEURONTIN) 300 MG capsule    Sig: Take 1 capsule (300 mg total) by mouth 3 (three) times daily.    Dispense:  90 capsule    Refill:  5    Follow-up plan:   Return in about 6 months (around 02/25/2023) for Medication Management, in person.   Recent Visits No visits were found meeting these conditions. Showing recent visits within past 90 days and meeting all other requirements Today's  Visits Date Type Provider Dept  08/26/22 Office Visit Edward Jolly, MD [facility]  Showing today's visits and meeting all other requirements Future Appointments No visits were found meeting these conditions. Showing future appointments within next 90 days and meeting all other requirements  I discussed the assessment and treatment plan with the patient. The patient was provided an opportunity to ask questions and all were answered. The patient agreed with the plan and demonstrated an understanding of the instructions.  Patient advised to call back or seek an in-person evaluation if the symptoms or condition worsens.  Duration of encounter:15 minutes.  Note by: Edward Jolly, MD Date: 08/26/2022; Time: 2:51 PM

## 2022-09-24 DIAGNOSIS — H40041 Steroid responder, right eye: Secondary | ICD-10-CM | POA: Diagnosis not present

## 2022-10-03 ENCOUNTER — Other Ambulatory Visit: Payer: Self-pay | Admitting: Oncology

## 2022-12-09 ENCOUNTER — Ambulatory Visit
Admission: RE | Admit: 2022-12-09 | Discharge: 2022-12-09 | Disposition: A | Discharge status: Home / Self Care | Payer: Medicare HMO | Source: Ambulatory Visit | Attending: Oncology | Admitting: Oncology

## 2022-12-09 DIAGNOSIS — Z170 Estrogen receptor positive status [ER+]: Secondary | ICD-10-CM | POA: Diagnosis not present

## 2022-12-09 DIAGNOSIS — R92323 Mammographic fibroglandular density, bilateral breasts: Secondary | ICD-10-CM | POA: Diagnosis not present

## 2022-12-09 DIAGNOSIS — C50412 Malignant neoplasm of upper-outer quadrant of left female breast: Secondary | ICD-10-CM | POA: Diagnosis not present

## 2022-12-21 DIAGNOSIS — C50412 Malignant neoplasm of upper-outer quadrant of left female breast: Secondary | ICD-10-CM | POA: Diagnosis not present

## 2022-12-21 DIAGNOSIS — Z170 Estrogen receptor positive status [ER+]: Secondary | ICD-10-CM | POA: Diagnosis not present

## 2023-01-05 DIAGNOSIS — I10 Essential (primary) hypertension: Secondary | ICD-10-CM | POA: Diagnosis not present

## 2023-01-05 DIAGNOSIS — E7800 Pure hypercholesterolemia, unspecified: Secondary | ICD-10-CM | POA: Diagnosis not present

## 2023-01-05 DIAGNOSIS — E663 Overweight: Secondary | ICD-10-CM | POA: Diagnosis not present

## 2023-01-05 DIAGNOSIS — N1831 Chronic kidney disease, stage 3a: Secondary | ICD-10-CM | POA: Diagnosis not present

## 2023-01-05 DIAGNOSIS — E1122 Type 2 diabetes mellitus with diabetic chronic kidney disease: Secondary | ICD-10-CM | POA: Diagnosis not present

## 2023-01-08 ENCOUNTER — Ambulatory Visit
Admission: EM | Admit: 2023-01-08 | Discharge: 2023-01-08 | Disposition: A | Discharge status: Home / Self Care | Payer: Medicare HMO

## 2023-01-08 ENCOUNTER — Ambulatory Visit: Payer: Medicare HMO

## 2023-01-08 DIAGNOSIS — W19XXXA Unspecified fall, initial encounter: Secondary | ICD-10-CM

## 2023-01-08 DIAGNOSIS — R0781 Pleurodynia: Secondary | ICD-10-CM | POA: Diagnosis not present

## 2023-01-08 DIAGNOSIS — R0602 Shortness of breath: Secondary | ICD-10-CM | POA: Diagnosis not present

## 2023-01-08 DIAGNOSIS — R918 Other nonspecific abnormal finding of lung field: Secondary | ICD-10-CM | POA: Diagnosis not present

## 2023-01-08 MED ORDER — MELOXICAM 7.5 MG PO TABS: 7.5000 mg | ORAL_TABLET | Freq: Every day | ORAL | 0 refills | Status: DC

## 2023-01-08 NOTE — Discharge Instructions (Addendum)
X-ray pending- you will be called will results tomorrow   If there is a broken rib we allow them to heal on their own, May take up to 3 months for it to fully heal, however pain should improve with time  Use meloxicam every morning for 5 days to help reduce inflammation then may use as needed, take with food to prevent stomach irritation  You may use compression wrap when completing activity add stability and support with movement  May use ice or heat over the affected area in 10 to 15-minute intervals   You may cushion the body with pillows whenever sitting and lying for comfort and support  You may follow-up with his urgent care or your primary doctor for reevaluation as needed

## 2023-01-08 NOTE — ED Triage Notes (Signed)
Pt states she fell a week ago and landed on her left side rib/breast. Now she is having pain when moving and deep breaths on her left side. Pt states she had breast cancer surgery 3 years ago on left side and wanted to make sure nothing is broken.

## 2023-01-09 ENCOUNTER — Telehealth: Payer: Self-pay | Admitting: Emergency Medicine

## 2023-01-09 MED ORDER — AMOXICILLIN-POT CLAVULANATE 875-125 MG PO TABS: 1.0000 | ORAL_TABLET | Freq: Two times a day (BID) | ORAL | 0 refills | Status: DC

## 2023-01-09 NOTE — ED Provider Notes (Signed)
Tonya Bowen    CSN: [id number] Arrival date & time: 01/08/23  1353      History   Chief Complaint Chief Complaint  Patient presents with   Chest Pain    HPI PABLA NEDD is a 77 y.o. female.   Patient presents for evaluation of pain to the anterior left chest radiating to the left flank present for 7 days occurring after fall.  Was walking down steps carrying a box in the mail unable to see when she misstepped causing her to fall forward landing on top of the box causing it to present to the left breast.  Pain has been present since, actuating intensity, exacerbated by sneezing, coughing and all movements.  Feels as if she has shallow breathing but denies shortness of breath.  Completed surgical intervention for brain cancer 3 years ago and has silicone implant, mammogram completed within the last week, endorses she was not told of any concerning results.  Wants to ensure there is no injury to the breast or bone.  Past Medical History:  Diagnosis Date   Arthritis    Cancer (HCC) 12/27/2019   left breast   Chronic back pain    Complication of anesthesia    pt states she felt every thing during breast augmentation surgery   Diabetes mellitus without complication (HCC)    Family history of breast cancer    Family history of lung cancer    Family history of uterine cancer    GERD (gastroesophageal reflux disease)    Hypertension    Stomach ulcer     Patient Active Problem List   Diagnosis Date Noted   Hypocalcemia 06/17/2022   UTI (urinary tract infection) 06/17/2022   Aromatase inhibitor use 06/16/2021   Genetic testing 01/22/2020   Family history of uterine cancer    Family history of lung cancer    Malignant neoplasm of upper-outer quadrant of left breast in female, estrogen receptor positive (HCC) 01/03/2020   Goals of care, counseling/discussion 01/03/2020   Family history of breast cancer 01/03/2020   Chronic midline thoracic back pain 01/02/2020    Lumbar degenerative disc disease 01/02/2020   Lumbar facet arthropathy 01/02/2020   Chronic pain syndrome 01/02/2020   Hyperglycemia 12/09/2017   Osteopenia 10/04/2014   S/P kyphoplasty 02/06/2014   Benign essential hypertension 09/03/2013    Past Surgical History:  Procedure Laterality Date   ABDOMINAL HYSTERECTOMY     AUGMENTATION MAMMAPLASTY Bilateral 1987   BREAST ENHANCEMENT SURGERY     BREAST LUMPECTOMY Left 01/2020   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   BREAST LUMPECTOMY WITH SENTINEL LYMPH NODE BIOPSY Left 02/04/2020   Procedure: BREAST LUMPECTOMY WITH SENTINEL LYMPH NODE BX;  Surgeon: Earline Mayotte, MD;  Location: [facility] ORS;  Service: General;  Laterality: Left;   BUNIONECTOMY Bilateral    Great toes   CATARACT EXTRACTION Left    colonoscopy     COLONOSCOPY N/A 06/14/2022   Procedure: COLONOSCOPY;  Surgeon: Jaynie Collins, DO;  Location: [facility] ENDOSCOPY;  Service: Gastroenterology;  Laterality: N/A;   ESOPHAGOGASTRODUODENOSCOPY N/A 06/14/2022   Procedure: ESOPHAGOGASTRODUODENOSCOPY (EGD);  Surgeon: Jaynie Collins, DO;  Location: [facility] ENDOSCOPY;  Service: Gastroenterology;  Laterality: N/A;   GLAUCOMA SURGERY  01/26/2022   KYPHOPLASTY N/A 02/06/2014   Procedure:  L2 KYPHOPLASTY;  Surgeon: Venita Lick, MD;  Location: [facility] OR;  Service: Orthopedics;  Laterality: N/A;   RADIAL KERATOTOMY     TONSILLECTOMY     UPPER GI ENDOSCOPY  OB History   No obstetric history on file.      Home Medications    Prior to Admission medications   Medication Sig Start Date End Date Taking? Authorizing Provider  anastrozole (ARIMIDEX) 1 MG tablet TAKE 1 TABLET BY MOUTH EVERY DAY 10/04/22  Yes Rickard Patience, MD  cetirizine (ZYRTEC) 10 MG tablet Take 10 mg by mouth every morning.   Yes [provider]  cyanocobalamin (VITAMIN B12) 1000 MCG tablet Take 1,000 mcg by mouth daily.   Yes [provider]  dorzolamide-timolol (COSOPT)  22.3-6.8 MG/ML ophthalmic solution Place 1 drop into the right eye 2 (two) times daily. 11/30/21  Yes [provider]  gabapentin (NEURONTIN) 300 MG capsule Take 1 capsule (300 mg total) by mouth 3 (three) times daily. 08/26/22 02/22/23 Yes Edward Jolly, MD  lisinopril (ZESTRIL) 10 MG tablet Take 10 mg by mouth daily.   Yes [provider]  meloxicam (MOBIC) 7.5 MG tablet Take 1 tablet (7.5 mg total) by mouth daily. 01/08/23  Yes Tiernan Millikin R, NP  Semaglutide (OZEMPIC, 0.25 OR 0.5 MG/DOSE, Crisp) Inject 0.25 mg into the skin once a week.    Yes [provider]  Vitamin D, Ergocalciferol, (DRISDOL) 1.25 MG (50000 UNIT) CAPS capsule Take 50,000 Units by mouth once a week. 12/25/22  Yes [provider]  acetaminophen (TYLENOL) 325 MG tablet Take 650 mg by mouth every 6 (six) hours as needed for moderate pain.     [provider]    Family History Family History  Problem Relation Age of Onset   Breast cancer Paternal Aunt    Breast cancer Paternal Aunt    Uterine cancer Mother        probable   Lung cancer Brother        metastatic disease; unsure if lung was pimary   Breast cancer Paternal Aunt    ALS Daughter    Breast cancer Cousin        dx 36s   Breast cancer Cousin        dx 7s   Breast cancer Cousin        dx 52s   Lung cancer Cousin     Social History Social History   Tobacco Use   Smoking status: Never   Smokeless tobacco: Never  Vaping Use   Vaping status: Never Used  Substance Use Topics   Alcohol use: Yes    Alcohol/week: 2.0 standard drinks of alcohol    Types: 2 Glasses of wine per week    Comment: socially   Drug use: No     Allergies   Patient has no known allergies.   Review of Systems Review of Systems  Cardiovascular:  Positive for chest pain.     Physical Exam Triage Vital Signs ED Triage Vitals [01/08/23 1428]  Encounter Vitals Group     BP 132/79     Systolic BP Percentile      Diastolic BP  Percentile      Pulse Rate 67     Resp 16     Temp 98.6 F (37 C)     Temp Source Oral     SpO2 96 %     Weight      Height      Head Circumference      Peak Flow      Pain Score 3     Pain Loc      Pain Education      Exclude from  Growth Chart    No data found.  Updated Vital Signs BP 132/79 (BP Location: Right Arm)   Pulse 67   Temp 98.6 F (37 C) (Oral)   Resp 16   SpO2 96%   Visual Acuity Right Eye Distance:   Left Eye Distance:   Bilateral Distance:    Right Eye Near:   Left Eye Near:    Bilateral Near:     Physical Exam Constitutional:      Appearance: Normal appearance.  HENT:     Head: Normocephalic.  Eyes:     Extraocular Movements: Extraocular movements intact.  Cardiovascular:     Rate and Rhythm: Normal rate and regular rhythm.     Pulses: Normal pulses.     Heart sounds: Normal heart sounds.  Pulmonary:     Effort: Pulmonary effort is normal.     Breath sounds: Normal breath sounds.  Chest:     Comments: Tenderness is present to the left anterior chest wall beginning at ribs 3 through 5 extending to the left flank, no ecchymosis swelling or deformity, no abnormality to the left breast Neurological:     Mental Status: She is alert and oriented to person, place, and time. Mental status is at baseline.      UC Treatments / Results  Labs (all labs ordered are listed, but only abnormal results are displayed) Labs Reviewed - No data to display  EKG   Radiology DG Ribs Unilateral W/Chest Left  Result Date: 01/08/2023 CLINICAL DATA:  Fall, left upper rib pain shortness of breath EXAM: LEFT RIBS AND CHEST - 3+ VIEW COMPARISON:  01/30/2014 FINDINGS: No displaced fracture or other bone lesions are seen involving the ribs. Possible nondisplaced fracture of the anterolateral left third rib seen on oblique view only. There is no evidence of pneumothorax or pleural effusion. Diffuse bilateral interstitial pulmonary opacity. Heart size and mediastinal  contours are within normal limits. IMPRESSION: 1. No displaced fracture of the ribs. Possible nondisplaced fracture of the anterolateral left third rib seen on oblique view only. 2. Diffuse bilateral interstitial pulmonary opacity, consistent with edema or atypical/viral infection. No focal airspace opacity. No pneumothorax or pleural effusion. Electronically Signed   By: Jearld Lesch M.D.   On: 01/08/2023 16:09    Procedures Procedures (including critical care time)  Medications Ordered in UC Medications - No data to display  Initial Impression / Assessment and Plan / UC Course  I have reviewed the triage vital signs and the nursing notes.  Pertinent labs & imaging results that were available during my care of the patient were reviewed by me and considered in my medical decision making (see chart for details).  Rib pain on the left side, fall, initial encounter  X-ray pending, no respiratory involvement, vitals are stable, discussed timeline for healing if rib fracture noted, given compression wrap for stability and comfort also recommended if uncomfortable may attempt use of the double sports bra for compression, prescribed meloxicam for pain and advise ice and heat, pillows for support with activity as tolerated, advise follow-up with urgent care or PCP for any further concerns Final Clinical Impressions(s) / UC Diagnoses   Final diagnoses:  Fall, initial encounter  Rib pain on left side     Discharge Instructions      X-ray pending- you will be called will results tomorrow   If there is a broken rib we allow them to heal on their own, May take up to 3 months for it to fully  heal, however pain should improve with time  Use meloxicam every morning for 5 days to help reduce inflammation then may use as needed, take with food to prevent stomach irritation  You may use compression wrap when completing activity add stability and support with movement  May use ice or heat over the  affected area in 10 to 15-minute intervals   You may cushion the body with pillows whenever sitting and lying for comfort and support  You may follow-up with his urgent care or your primary doctor for reevaluation as needed   ED Prescriptions     Medication Sig Dispense Auth. Provider   meloxicam (MOBIC) 7.5 MG tablet Take 1 tablet (7.5 mg total) by mouth daily. 30 tablet Valinda Hoar, NP      PDMP not reviewed this encounter.   Valinda Hoar, [place] 01/09/23 [phone]

## 2023-01-09 NOTE — Telephone Encounter (Signed)
Reported left rib x-ray results to patient of possible third rib fracture, discussed additional findings of possible edema versus atypical infection, patient endorses experiencing nasal congestion primarily in the morning but denies further URI symptoms, based on medical history has no cause for pulmonary edema therefore will provide coverage for atypical viral infection, Augmentin sent to pharmacy for coverage, recommended repeat chest x-ray in 1 month with PCP and follow-up as needed for any concerning symptoms

## 2023-01-11 DIAGNOSIS — L821 Other seborrheic keratosis: Secondary | ICD-10-CM | POA: Diagnosis not present

## 2023-01-11 DIAGNOSIS — L570 Actinic keratosis: Secondary | ICD-10-CM | POA: Diagnosis not present

## 2023-01-14 ENCOUNTER — Other Ambulatory Visit: Payer: Self-pay

## 2023-01-14 DIAGNOSIS — M8580 Other specified disorders of bone density and structure, unspecified site: Secondary | ICD-10-CM

## 2023-01-17 ENCOUNTER — Encounter: Payer: Self-pay | Admitting: Oncology

## 2023-01-17 ENCOUNTER — Inpatient Hospital Stay: Payer: Medicare HMO

## 2023-01-17 ENCOUNTER — Inpatient Hospital Stay: Payer: Medicare HMO | Admitting: Oncology

## 2023-01-17 ENCOUNTER — Inpatient Hospital Stay: Payer: Medicare HMO | Attending: Oncology

## 2023-01-17 VITALS — BP 150/65 | HR 52 | Temp 97.5°F | Resp 18 | Wt 188.1 lb

## 2023-01-17 DIAGNOSIS — H4010X0 Unspecified open-angle glaucoma, stage unspecified: Secondary | ICD-10-CM | POA: Diagnosis not present

## 2023-01-17 DIAGNOSIS — Z170 Estrogen receptor positive status [ER+]: Secondary | ICD-10-CM | POA: Diagnosis not present

## 2023-01-17 DIAGNOSIS — M8580 Other specified disorders of bone density and structure, unspecified site: Secondary | ICD-10-CM

## 2023-01-17 DIAGNOSIS — C50412 Malignant neoplasm of upper-outer quadrant of left female breast: Secondary | ICD-10-CM

## 2023-01-17 DIAGNOSIS — H40041 Steroid responder, right eye: Secondary | ICD-10-CM | POA: Diagnosis not present

## 2023-01-17 DIAGNOSIS — H35351 Cystoid macular degeneration, right eye: Secondary | ICD-10-CM | POA: Diagnosis not present

## 2023-01-17 LAB — CMP (CANCER CENTER ONLY)
ALT: 13 U/L (ref 0–44)
AST: 19 U/L (ref 15–41)
Albumin: 3.8 g/dL (ref 3.5–5.0)
Alkaline Phosphatase: 77 U/L (ref 38–126)
Anion gap: 6 (ref 5–15)
BUN: 35 mg/dL — ABNORMAL HIGH (ref 8–23)
CO2: 28 mmol/L (ref 22–32)
Calcium: 9.3 mg/dL (ref 8.9–10.3)
Chloride: 107 mmol/L (ref 98–111)
Creatinine: 1.12 mg/dL — ABNORMAL HIGH (ref 0.44–1.00)
GFR, Estimated: 51 mL/min — ABNORMAL LOW (ref >60)
Glucose, Bld: 92 mg/dL (ref 70–99)
Potassium: 4.2 mmol/L (ref 3.5–5.1)
Sodium: 141 mmol/L (ref 135–145)
Total Bilirubin: 0.6 mg/dL (ref <1.2)
Total Protein: 6.5 g/dL (ref 6.5–8.1)

## 2023-01-17 LAB — CBC WITH DIFFERENTIAL (CANCER CENTER ONLY)
Abs Immature Granulocytes: 0.02 10*3/uL (ref 0.00–0.07)
Basophils Absolute: 0 10*3/uL (ref 0.0–0.1)
Basophils Relative: 0 %
Eosinophils Absolute: 0.3 10*3/uL (ref 0.0–0.5)
Eosinophils Relative: 4 %
HCT: 36.7 % (ref 36.0–46.0)
Hemoglobin: 12 g/dL (ref 12.0–15.0)
Immature Granulocytes: 0 %
Lymphocytes Relative: 16 %
Lymphs Abs: 1.2 10*3/uL (ref 0.7–4.0)
MCH: 31 pg (ref 26.0–34.0)
MCHC: 32.7 g/dL (ref 30.0–36.0)
MCV: 94.8 fL (ref 80.0–100.0)
Monocytes Absolute: 0.4 10*3/uL (ref 0.1–1.0)
Monocytes Relative: 6 %
Neutro Abs: 5.2 10*3/uL (ref 1.7–7.7)
Neutrophils Relative %: 74 %
Platelet Count: 269 10*3/uL (ref 150–400)
RBC: 3.87 MIL/uL (ref 3.87–5.11)
RDW: 12.5 % (ref 11.5–15.5)
WBC Count: 7.1 10*3/uL (ref 4.0–10.5)
nRBC: 0 % (ref 0.0–0.2)

## 2023-01-17 MED ORDER — ANASTROZOLE 1 MG PO TABS: 1.0000 mg | ORAL_TABLET | Freq: Every day | ORAL | 1 refills | Status: DC

## 2023-01-17 MED ORDER — DENOSUMAB 60 MG/ML ~~LOC~~ SOSY
60.0000 mg | PREFILLED_SYRINGE | Freq: Once | SUBCUTANEOUS | Status: AC
  Administered 2023-01-17: 60 mg via SUBCUTANEOUS
  Filled 2023-01-17: qty 1

## 2023-01-17 NOTE — Assessment & Plan Note (Signed)
Recommend calcium 1000-1200 mgand vitmain D supplementation.

## 2023-01-17 NOTE — Progress Notes (Signed)
Hematology/Oncology Progress note [phone] [phone]     CHIEF COMPLAINTS/REASON FOR VISIT:  Follow-up for breast cancer management   ASSESSMENT & PLAN:   Cancer Staging  Malignant neoplasm of upper-outer quadrant of left breast in female, estrogen receptor positive (HCC) Staging form: Breast, AJCC 8th Edition - Clinical: Stage IB (cT2, cN0, cM0, G2, ER+, PR+, HER2-) - Signed by Rickard Patience, MD on 01/03/2020 - Pathologic stage from 02/04/2020: Stage IA (pT2, pN0, cM0, G2, ER+, PR+, HER2-, Oncotype DX score: 5) - Signed by Rickard Patience, MD on 12/16/2021   Malignant neoplasm of upper-outer quadrant of left breast in female, estrogen receptor positive (HCC) 01/2020 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 s/p adjuvant radiation. Labs are reviewed and discussed with patient. Continue Arimidex 1mg  daily Continue annual mammogram bilaterally.  Focal tenderness due to recent fall. She has implants. Refer plastic surgeon   Hypocalcemia Recommend calcium 1000-1200 mgand vitmain D supplementation.   Osteopenia 10/30/2019, DEXA showed osteopenia, 10-year risk of any major fracture 15%, 10-year risk of hip fracture 2.3%.   Continue calcium 1200 mg daily and vitamin D supplementation. Proceed with Prolia  Orders Placed This Encounter  Procedures   CBC with Differential (Cancer Center Only)    Standing Status:   Future    Standing Expiration Date:   01/17/2024   CMP (Cancer Center only)    Standing Status:   Future    Standing Expiration Date:   01/17/2024   Ambulatory referral to Plastic Surgery    Referral Priority:   Routine    Referral Type:   Surgical    Referral Reason:   Specialty Services Required    Referred to Provider:   Peggye Form, DO    Requested Specialty:   Plastic Surgery    Number of Visits Requested:   1   Follow up in 6 months.  All questions were  answered. The patient knows to call the clinic with any problems, questions or concerns.  Rickard Patience, MD, PhD [facility] Hematology Oncology 01/17/2023    HISTORY OF PRESENTING ILLNESS:   Tonya Bowen is a  77 y.o.  female with presents for follow up of evaluation of breast cancer Oncology History  Malignant neoplasm of upper-outer quadrant of left breast in female, estrogen receptor positive (HCC)  12/19/2019 Mammogram   Unilateral left breast diagnostic mammogram showed  22mm irregular left upper outer breast mass, no left axillary lymphadenopathy.   01/03/2020 Initial Diagnosis   Malignant neoplasm of upper-outer quadrant of left breast in female, estrogen receptor positive  Family history of breast cancer: paternal aunt x 2 breast cancer Family history of other cancers: brother lung cancer, mother possible uterine cancer Menarche: 13 Menopause: hystectomy in 37s Age at first live childbirth:23 Used OCP: about 10 years Used estrogen and progesterone therapy: 20+ years HRT History of Radiation to the chest: none Previous of breast biopsy: none   01/03/2020 Cancer Staging   Staging form: Breast, AJCC 8th Edition - Clinical: Stage IB (cT2, cN0, cM0, G2, ER+, PR+, HER2-) - Signed by Rickard Patience, MD on 01/03/2020   01/21/2020 Genetic Testing   Negative genetic testing. No pathogenic variants identified on the [facility] Common Hereditary Cancers Panel +EGFR. The report date is 01/21/2020.  The Common Hereditary Cancers Panel offered by [facility] includes sequencing and/or deletion duplication testing of the following 48 genes: APC, ATM, AXIN2, BARD1, BMPR1A, BRCA1, BRCA2, BRIP1, CDH1, CDKN2A (  p14ARF), CDKN2A (p16INK4a), CKD4, CHEK2, CTNNA1, DICER1, EGFR,  EPCAM (Deletion/duplication testing only), GREM1 (promoter region deletion/duplication testing only), KIT, MEN1, MLH1, MSH2, MSH3, MSH6, MUTYH, NBN, NF1, NHTL1, PALB2, PDGFRA, PMS2, POLD1, POLE, PTEN, RAD50, RAD51C, RAD51D, RNF43, SDHB, SDHC,  SDHD, SMAD4, SMARCA4. STK11, TP53, TSC1, TSC2, and VHL.  The following genes were evaluated for sequence changes only: SDHA and HOXB13 c.251G>A variant only.   02/04/2020 Cancer Staging   Staging form: Breast, AJCC 8th Edition - Pathologic stage from 02/04/2020: Stage IA (pT2, pN0, cM0, G2, ER+, PR+, HER2-, Oncotype DX score: 5) - Signed by Rickard Patience, MD on 12/16/2021 Stage prefix: Initial diagnosis Multigene prognostic tests performed: Oncotype DX Recurrence score range: Less than 11 Histologic grading system: 3 grade system    - 04/29/2020 Radiation Therapy   finished adjuvant radiation therapy   05/14/2021 -  Anti-estrogen oral therapy   started on Arimidex   07/03/2021 Mammogram   Unilateral diagnostic mammogram /ultrasound left-for evaluation of left upper outer quadrant breast mass 1. No evidence of new or recurrent breast carcinoma. 2. Clinical and imaging findings are consistent with ongoing fat necrosis throughout the surgical bed    Imaging     12/07/2021 Mammogram   Bilateral diagnostic mammogram/implant abdominal bilateral -No evidence of malignancy in either breast     She has balance difficulties and she was recently seen by neurology Dr. Sherryll Burger on 06/04/2020.  Patient had work-up including SPEP and immunofixation.  Labs reviewed.  No M protein was identified.  - Reports a history of approximately "3 or 4" UTIs per year.  INTERVAL HISTORY Tonya Bowen is a 77 y.o. female who has above history reviewed by me today presents for follow up visit for management of left breast cancer, pT2N0 , ER/PR positive, HER-2 negative. Patient takes anastrozole 1 mg daily.  Overall tolerates well, manageable side effects. She had a fall recently and has experienced left breast bruising and pain. She is concerned as she has breast implant. .    Review of Systems  Constitutional:  Negative for appetite change, chills, fatigue and fever.  HENT:   Negative for hearing loss and voice change.    Eyes:  Negative for eye problems.  Respiratory:  Negative for chest tightness and cough.   Cardiovascular:  Negative for chest pain.  Gastrointestinal:  Negative for abdominal distention, abdominal pain and blood in stool.  Endocrine: Negative for hot flashes.  Genitourinary:  Negative for difficulty urinating, dysuria and frequency.   Musculoskeletal:  Positive for arthralgias and back pain.  Skin:  Negative for itching and rash.  Neurological:  Negative for extremity weakness.  Hematological:  Negative for adenopathy.  Psychiatric/Behavioral:  Negative for confusion.     MEDICAL HISTORY:  Past Medical History:  Diagnosis Date   Arthritis    Cancer (HCC) 12/27/2019   left breast   Chronic back pain    Complication of anesthesia    pt states she felt every thing during breast augmentation surgery   Diabetes mellitus without complication (HCC)    Family history of breast cancer    Family history of lung cancer    Family history of uterine cancer    GERD (gastroesophageal reflux disease)    Hypertension    Stomach ulcer     SURGICAL HISTORY: Past Surgical History:  Procedure Laterality Date   ABDOMINAL HYSTERECTOMY     AUGMENTATION MAMMAPLASTY Bilateral 1987   BREAST ENHANCEMENT SURGERY     BREAST LUMPECTOMY Left 01/2020   INVASIVE SOLID  PAPILLARY CARCINOMA WITH MUCINOUS CARCINOMA   BREAST LUMPECTOMY WITH SENTINEL LYMPH NODE BIOPSY Left 02/04/2020   Procedure: BREAST LUMPECTOMY WITH SENTINEL LYMPH NODE BX;  Surgeon: Earline Mayotte, MD;  Location: [facility] ORS;  Service: General;  Laterality: Left;   BUNIONECTOMY Bilateral    Great toes   CATARACT EXTRACTION Left    colonoscopy     COLONOSCOPY N/A 06/14/2022   Procedure: COLONOSCOPY;  Surgeon: Jaynie Collins, DO;  Location: [facility] ENDOSCOPY;  Service: Gastroenterology;  Laterality: N/A;   ESOPHAGOGASTRODUODENOSCOPY N/A 06/14/2022   Procedure: ESOPHAGOGASTRODUODENOSCOPY (EGD);  Surgeon: Jaynie Collins, DO;   Location: [facility] ENDOSCOPY;  Service: Gastroenterology;  Laterality: N/A;   GLAUCOMA SURGERY  01/26/2022   KYPHOPLASTY N/A 02/06/2014   Procedure:  L2 KYPHOPLASTY;  Surgeon: Venita Lick, MD;  Location: [facility] OR;  Service: Orthopedics;  Laterality: N/A;   RADIAL KERATOTOMY     TONSILLECTOMY     UPPER GI ENDOSCOPY      SOCIAL HISTORY: Social History   Socioeconomic History   Marital status: Divorced    Spouse name: Not on file   Number of children: Not on file   Years of education: Not on file   Highest education level: Not on file  Occupational History   Not on file  Tobacco Use   Smoking status: Never   Smokeless tobacco: Never  Vaping Use   Vaping status: Never Used  Substance and Sexual Activity   Alcohol use: Yes    Alcohol/week: 2.0 standard drinks of alcohol    Types: 2 Glasses of wine per week    Comment: socially   Drug use: No   Sexual activity: Not Currently  Other Topics Concern   Not on file  Social History Narrative   Not on file   Social Determinants of Health   Financial Resource Strain: Not on file  Food Insecurity: Not on file  Transportation Needs: Not on file  Physical Activity: Not on file  Stress: Not on file  Social Connections: Not on file  Intimate Partner Violence: Not on file    FAMILY HISTORY: Family History  Problem Relation Age of Onset   Breast cancer Paternal Aunt    Breast cancer Paternal Aunt    Uterine cancer Mother        probable   Lung cancer Brother        metastatic disease; unsure if lung was pimary   Breast cancer Paternal Aunt    ALS Daughter    Breast cancer Cousin        dx 31s   Breast cancer Cousin        dx 74s   Breast cancer Cousin        dx 60s   Lung cancer Cousin     ALLERGIES:  has No Known Allergies.  MEDICATIONS:  Current Outpatient Medications  Medication Sig Dispense Refill   acetaminophen (TYLENOL) 325 MG tablet Take 650 mg by mouth every 6 (six) hours as needed for moderate pain.       amLODipine (NORVASC) 5 MG tablet Take 1 tablet by mouth daily.     amoxicillin-clavulanate (AUGMENTIN) 875-125 MG tablet Take 1 tablet by mouth every 12 (twelve) hours. 14 tablet 0   Calcium Carb-Cholecalciferol (CALCIUM 500 + D PO) Take 2 tablets by mouth daily.     cetirizine (ZYRTEC) 10 MG tablet Take 10 mg by mouth every morning.     cyanocobalamin (VITAMIN B12) 1000 MCG tablet Take 1,000 mcg by mouth daily.  dorzolamide-timolol (COSOPT) 22.3-6.8 MG/ML ophthalmic solution Place 1 drop into the right eye 2 (two) times daily.     gabapentin (NEURONTIN) 300 MG capsule Take 1 capsule (300 mg total) by mouth 3 (three) times daily. 90 capsule 5   lisinopril (ZESTRIL) 10 MG tablet Take 10 mg by mouth daily.     meloxicam (MOBIC) 7.5 MG tablet Take 1 tablet (7.5 mg total) by mouth daily. 30 tablet 0   Semaglutide (OZEMPIC, 0.25 OR 0.5 MG/DOSE, [place]) Inject 0.25 mg into the skin once a week.      Vitamin D, Ergocalciferol, (DRISDOL) 1.25 MG (50000 UNIT) CAPS capsule Take 50,000 Units by mouth once a week.     anastrozole (ARIMIDEX) 1 MG tablet Take 1 tablet (1 mg total) by mouth daily. 90 tablet 1   No current facility-administered medications for this visit.     PHYSICAL EXAMINATION: ECOG PERFORMANCE STATUS: 1 - Symptomatic but completely ambulatory Vitals:   01/17/23 1305 01/17/23 1320  BP: (!) 145/56 (!) 150/65  Pulse: (!) 52   Resp: 18   Temp: (!) 97.5 F (36.4 C)    Filed Weights   01/17/23 1305  Weight: 188 lb 1.6 oz (85.3 kg)    Physical Exam Constitutional:      General: She is not in acute distress. HENT:     Head: Normocephalic and atraumatic.  Eyes:     General: No scleral icterus. Cardiovascular:     Rate and Rhythm: Normal rate and regular rhythm.     Heart sounds: Normal heart sounds.  Pulmonary:     Effort: Pulmonary effort is normal. No respiratory distress.     Breath sounds: No wheezing.  Abdominal:     General: Bowel sounds are normal. There is no  distension.     Palpations: Abdomen is soft.  Musculoskeletal:        General: No deformity. Normal range of motion.     Cervical back: Normal range of motion and neck supple.  Skin:    General: Skin is warm and dry.     Findings: No erythema or rash.  Neurological:     Mental Status: She is alert and oriented to person, place, and time. Mental status is at baseline.     Cranial Nerves: No cranial nerve deficit.     Coordination: Coordination normal.  Psychiatric:        Mood and Affect: Mood normal.    Breast exam was performed in seated and lying down position. Patient is status post left lumpectomy with a well-healed surgical scar.  History of breast augmentation.  There is palpable area of tissue thickening at the site of lumpectomy. No discrete mass palpable in right breast.  No palpable lymphadenopathy bilateral axillary  LABORATORY DATA:  I have reviewed the data as listed    Latest Ref Rng & Units 01/17/2023   12:47 PM 07/15/2022   12:52 PM 06/17/2022    1:47 PM  CBC  WBC 4.0 - 10.5 K/uL 7.1  8.5  7.2   Hemoglobin 12.0 - 15.0 g/dL 16.1  09.6  04.5   Hematocrit 36.0 - 46.0 % 36.7  43.1  43.2   Platelets 150 - 400 K/uL 269  281  253       Latest Ref Rng & Units 01/17/2023   12:47 PM 07/15/2022   12:52 PM 06/17/2022    1:47 PM  CMP  Glucose 70 - 99 mg/dL 92  409  99   BUN 8 - 23  mg/dL 35  19  14   Creatinine 0.44 - 1.00 mg/dL 8.29  5.62  1.30   Sodium 135 - 145 mmol/L 141  142  140   Potassium 3.5 - 5.1 mmol/L 4.2  4.1  3.5   Chloride 98 - 111 mmol/L 107  108  108   CO2 22 - 32 mmol/L 28  26  25    Calcium 8.9 - 10.3 mg/dL 9.3  8.5  8.6   Total Protein 6.5 - 8.1 g/dL 6.5   6.4   Total Bilirubin <1.2 mg/dL 0.6   0.4   Alkaline Phos 38 - 126 U/L 77   52   AST 15 - 41 U/L 19   20   ALT 0 - 44 U/L 13   10      RADIOGRAPHIC STUDIES: I have personally reviewed the radiological images as listed and agreed with the findings in the report. DG Ribs Unilateral W/Chest  Left  Result Date: 01/08/2023 CLINICAL DATA:  Fall, left upper rib pain shortness of breath EXAM: LEFT RIBS AND CHEST - 3+ VIEW COMPARISON:  01/30/2014 FINDINGS: No displaced fracture or other bone lesions are seen involving the ribs. Possible nondisplaced fracture of the anterolateral left third rib seen on oblique view only. There is no evidence of pneumothorax or pleural effusion. Diffuse bilateral interstitial pulmonary opacity. Heart size and mediastinal contours are within normal limits. IMPRESSION: 1. No displaced fracture of the ribs. Possible nondisplaced fracture of the anterolateral left third rib seen on oblique view only. 2. Diffuse bilateral interstitial pulmonary opacity, consistent with edema or atypical/viral infection. No focal airspace opacity. No pneumothorax or pleural effusion. Electronically Signed   By: Jearld Lesch M.D.   On: 01/08/2023 16:09   MM 3D DIAGNOSTIC MAMMOGRAM BILATERAL BREAST W/IMPLANT  Result Date: 12/09/2022 CLINICAL DATA:  Status post LEFT lumpectomy in 2021 EXAM: DIGITAL DIAGNOSTIC BILATERAL MAMMOGRAM WITH IMPLANTS, TOMOSYNTHESIS AND CAD; ULTRASOUND LEFT BREAST LIMITED TECHNIQUE: Bilateral digital diagnostic mammography and breast tomosynthesis was performed. Standard and/or implant displaced views were performed. The images were evaluated with computer-aided detection. ; Targeted ultrasound examination of the left breast was performed. COMPARISON:  Previous exam(s). ACR Breast Density Category b: There are scattered areas of fibroglandular density. FINDINGS: No suspicious mass, distortion, or microcalcifications are identified to suggest presence of malignancy in the RIGHT breast. There is density and architectural distortion within the LEFT breast, consistent with postsurgical changes. These are stable in comparison to prior. There is a new high density focal asymmetry in the LEFT lower breast. This is favored to reflect extracapsular silicone. The patient has  prepectoral silicone implants. Multiple high-density lobulations are noted along the contours, consistent with extracapsular rupture. Targeted ultrasound was performed of the LEFT lower breast. There is extensive snowstorm artifact noted at the site of focal asymmetry in the LEFT lower breast. This is consistent with extracapsular silicone. IMPRESSION: 1. No mammographic evidence of malignancy bilaterally. There is bilateral extracapsular rupture. RECOMMENDATION: Per protocol, as the patient is now 2 or more years status post lumpectomy, she may return to annual screening mammography in 1 year. However, given the history of breast cancer, the patient remains eligible for annual diagnostic mammography if preferred. (Code:SM-B-01Y) I have discussed the findings and recommendations with the patient. If applicable, a reminder letter will be sent to the patient regarding the next appointment. BI-RADS CATEGORY  2: Benign. Electronically Signed   By: Meda Klinefelter M.D.   On: 12/09/2022 15:46   [place] LIMITED ULTRASOUND INCLUDING AXILLA  LEFT BREAST   Result Date: 12/09/2022 CLINICAL DATA:  Status post LEFT lumpectomy in 2021 EXAM: DIGITAL DIAGNOSTIC BILATERAL MAMMOGRAM WITH IMPLANTS, TOMOSYNTHESIS AND CAD; ULTRASOUND LEFT BREAST LIMITED TECHNIQUE: Bilateral digital diagnostic mammography and breast tomosynthesis was performed. Standard and/or implant displaced views were performed. The images were evaluated with computer-aided detection. ; Targeted ultrasound examination of the left breast was performed. COMPARISON:  Previous exam(s). ACR Breast Density Category b: There are scattered areas of fibroglandular density. FINDINGS: No suspicious mass, distortion, or microcalcifications are identified to suggest presence of malignancy in the RIGHT breast. There is density and architectural distortion within the LEFT breast, consistent with postsurgical changes. These are stable in comparison to prior. There is a new high  density focal asymmetry in the LEFT lower breast. This is favored to reflect extracapsular silicone. The patient has prepectoral silicone implants. Multiple high-density lobulations are noted along the contours, consistent with extracapsular rupture. Targeted ultrasound was performed of the LEFT lower breast. There is extensive snowstorm artifact noted at the site of focal asymmetry in the LEFT lower breast. This is consistent with extracapsular silicone. IMPRESSION: 1. No mammographic evidence of malignancy bilaterally. There is bilateral extracapsular rupture. RECOMMENDATION: Per protocol, as the patient is now 2 or more years status post lumpectomy, she may return to annual screening mammography in 1 year. However, given the history of breast cancer, the patient remains eligible for annual diagnostic mammography if preferred. (Code:[id number]) I have discussed the findings and recommendations with the patient. If applicable, a reminder letter will be sent to the patient regarding the next appointment. BI-RADS CATEGORY  2: Benign. Electronically Signed   By: Meda Klinefelter M.D.   On: 12/09/2022 15:46

## 2023-01-17 NOTE — Assessment & Plan Note (Signed)
10/30/2019, DEXA showed osteopenia, 10-year risk of any major fracture 15%, 10-year risk of hip fracture 2.3%.   Continue calcium 1200 mg daily and vitamin D supplementation. Proceed with Prolia

## 2023-01-17 NOTE — Assessment & Plan Note (Addendum)
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 s/p adjuvant radiation. Labs are reviewed and discussed with patient. Continue Arimidex 1mg  daily Continue annual mammogram bilaterally.  Focal tenderness due to recent fall. She has implants. Refer plastic surgeon

## 2023-01-18 DIAGNOSIS — H40041 Steroid responder, right eye: Secondary | ICD-10-CM | POA: Diagnosis not present

## 2023-01-18 DIAGNOSIS — H35351 Cystoid macular degeneration, right eye: Secondary | ICD-10-CM | POA: Diagnosis not present

## 2023-01-18 DIAGNOSIS — Z961 Presence of intraocular lens: Secondary | ICD-10-CM | POA: Diagnosis not present

## 2023-01-18 DIAGNOSIS — H4010X0 Unspecified open-angle glaucoma, stage unspecified: Secondary | ICD-10-CM | POA: Diagnosis not present

## 2023-01-28 ENCOUNTER — Encounter: Payer: Self-pay | Admitting: Plastic Surgery

## 2023-01-28 ENCOUNTER — Ambulatory Visit (INDEPENDENT_AMBULATORY_CARE_PROVIDER_SITE_OTHER): Payer: Self-pay | Admitting: Plastic Surgery

## 2023-01-28 VITALS — BP 157/89 | HR 71 | Ht 64.0 in | Wt 186.4 lb

## 2023-01-28 DIAGNOSIS — C50412 Malignant neoplasm of upper-outer quadrant of left female breast: Secondary | ICD-10-CM | POA: Diagnosis not present

## 2023-01-28 DIAGNOSIS — Z170 Estrogen receptor positive status [ER+]: Secondary | ICD-10-CM | POA: Diagnosis not present

## 2023-01-28 DIAGNOSIS — Z923 Personal history of irradiation: Secondary | ICD-10-CM

## 2023-01-28 DIAGNOSIS — T8543XA Leakage of breast prosthesis and implant, initial encounter: Secondary | ICD-10-CM

## 2023-01-28 NOTE — Progress Notes (Signed)
Patient ID: Tonya Bowen, female    DOB: 1945/09/21, 77 y.o.   MRN: [id number]   Chief Complaint  Patient presents with   consult         The patient is a 77 year old female here for evaluation of her breasts.  The patient underwent left partial mastectomy approximately 30 years ago at [facility].  She was reconstructed with bilateral breast implants.  She does not know what size they are.  She believes that they are silicone implants.  She has been diligent about weight reduction over the past year and has been able to lose 55 pounds.  She is now 5 feet 4 inches tall and weighs 186 pounds.  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  According to the note from oncology the patient's genetic testing was negative in 2021.  She did have radiation therapy.  It looks like it finished 2 years ago.  Her past surgical history is positive for breast surgery with implant placement for reconstruction for a left partial mastectomy, tonsils, wisdom teeth, abdominal hysterectomy, spine surgery and bunions.  He is 5 feet 4 inches tall weighs 186 pounds. The patient had a mammogram in September 2024 which was negative for malignancy but showed bilateral extracapsular ruptures of the implants.  The patient is requesting removal of the implants with a mastopexy.    Review of Systems  Constitutional: Negative.   HENT: Negative.    Eyes: Negative.   Respiratory: Negative.    Cardiovascular: Negative.   Gastrointestinal: Negative.   Endocrine: Negative.   Genitourinary: Negative.   Musculoskeletal:  Positive for back pain.  Skin:  Positive for rash.    Past Medical History:  Diagnosis Date   Arthritis    Cancer (HCC) 12/27/2019   left breast   Chronic back pain    Complication of anesthesia    pt states she felt every  thing during breast augmentation surgery   Diabetes mellitus without complication (HCC)    Family history of breast cancer    Family history of lung cancer    Family history of uterine cancer    GERD (gastroesophageal reflux disease)    Hypertension    Stomach ulcer     Past Surgical History:  Procedure Laterality Date   ABDOMINAL HYSTERECTOMY     AUGMENTATION MAMMAPLASTY Bilateral 1987   BREAST ENHANCEMENT SURGERY     BREAST LUMPECTOMY Left 01/2020   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   BREAST LUMPECTOMY WITH SENTINEL LYMPH NODE BIOPSY Left 02/04/2020   Procedure: BREAST LUMPECTOMY WITH SENTINEL LYMPH NODE BX;  Surgeon: Earline Mayotte, MD;  Location: [facility] ORS;  Service: General;  Laterality: Left;   BUNIONECTOMY Bilateral    Great toes   CATARACT EXTRACTION Left    colonoscopy     COLONOSCOPY N/A 06/14/2022   Procedure: COLONOSCOPY;  Surgeon: Jaynie Collins, DO;  Location: [facility] ENDOSCOPY;  Service: Gastroenterology;  Laterality: N/A;   ESOPHAGOGASTRODUODENOSCOPY N/A 06/14/2022   Procedure: ESOPHAGOGASTRODUODENOSCOPY (EGD);  Surgeon: Jaynie Collins, DO;  Location: [facility] ENDOSCOPY;  Service: Gastroenterology;  Laterality: N/A;   GLAUCOMA SURGERY  01/26/2022   KYPHOPLASTY N/A 02/06/2014   Procedure:  L2 KYPHOPLASTY;  Surgeon: Venita Lick, MD;  Location: [facility] OR;  Service: Orthopedics;  Laterality: N/A;   RADIAL KERATOTOMY  TONSILLECTOMY     UPPER GI ENDOSCOPY        Current Outpatient Medications:    acetaminophen (TYLENOL) 325 MG tablet, Take 650 mg by mouth every 6 (six) hours as needed for moderate pain. , Disp: , Rfl:    amLODipine (NORVASC) 5 MG tablet, Take 1 tablet by mouth daily., Disp: , Rfl:    amoxicillin-clavulanate (AUGMENTIN) 875-125 MG tablet, Take 1 tablet by mouth every 12 (twelve) hours., Disp: 14 tablet, Rfl: 0   anastrozole (ARIMIDEX) 1 MG tablet, Take 1 tablet (1 mg total) by mouth daily., Disp: 90 tablet, Rfl: 1    Calcium Carb-Cholecalciferol (CALCIUM 500 + D PO), Take 2 tablets by mouth daily., Disp: , Rfl:    cetirizine (ZYRTEC) 10 MG tablet, Take 10 mg by mouth every morning., Disp: , Rfl:    cyanocobalamin (VITAMIN B12) 1000 MCG tablet, Take 1,000 mcg by mouth daily., Disp: , Rfl:    dorzolamide-timolol (COSOPT) 22.3-6.8 MG/ML ophthalmic solution, Place 1 drop into the right eye 2 (two) times daily., Disp: , Rfl:    gabapentin (NEURONTIN) 300 MG capsule, Take 1 capsule (300 mg total) by mouth 3 (three) times daily., Disp: 90 capsule, Rfl: 5   lisinopril (ZESTRIL) 10 MG tablet, Take 10 mg by mouth daily., Disp: , Rfl:    Vitamin D, Ergocalciferol, (DRISDOL) 1.25 MG (50000 UNIT) CAPS capsule, Take 50,000 Units by mouth once a week., Disp: , Rfl:    meloxicam (MOBIC) 7.5 MG tablet, Take 1 tablet (7.5 mg total) by mouth daily. (Patient not taking: Reported on 01/28/2023), Disp: 30 tablet, Rfl: 0   Semaglutide (OZEMPIC, 0.25 OR 0.5 MG/DOSE, [place]), Inject 0.25 mg into the skin once a week.  (Patient not taking: Reported on 01/28/2023), Disp: , Rfl:    Objective:   Vitals:   01/28/23 1216  BP: (!) 157/89  Pulse: 71  SpO2: 97%    Physical Exam Vitals and nursing note reviewed.  Constitutional:      Appearance: Normal appearance.  HENT:     Head: Normocephalic and atraumatic.  Cardiovascular:     Rate and Rhythm: Normal rate.     Pulses: Normal pulses.  Pulmonary:     Effort: Pulmonary effort is normal.  Abdominal:     General: There is no distension.     Palpations: Abdomen is soft. There is no mass.     Tenderness: There is no abdominal tenderness.  Skin:    General: Skin is warm.     Capillary Refill: Capillary refill takes less than 2 seconds.     Coloration: Skin is not jaundiced.     Findings: No bruising.  Neurological:     Mental Status: She is alert and oriented to person, place, and time.  Psychiatric:        Mood and Affect: Mood normal.        Behavior: Behavior normal.         Thought Content: Thought content normal.        Judgment: Judgment normal.     Assessment & Plan:  Malignant neoplasm of upper-outer quadrant of left breast in female, estrogen receptor positive (HCC)  Ruptured silicone breast implant, initial encounter  The patient would like to have bilateral implant removal with complete capsulectomy.  This will require drain on each side for 1 to 2 weeks.  She would also like a mastopexy.  Pictures were obtained of the patient and placed in the chart with the patient's or guardian's permission.  Alena Bills Cahterine Heinzel, DO

## 2023-02-01 ENCOUNTER — Telehealth: Payer: Self-pay

## 2023-02-01 NOTE — Telephone Encounter (Signed)
Faxed the Request for Surgical Clearance form to Dr. Terance Hart at [facility], PCP, with confirmed receipt.

## 2023-02-08 DIAGNOSIS — M5134 Other intervertebral disc degeneration, thoracic region: Secondary | ICD-10-CM | POA: Diagnosis not present

## 2023-02-08 DIAGNOSIS — R9389 Abnormal findings on diagnostic imaging of other specified body structures: Secondary | ICD-10-CM | POA: Diagnosis not present

## 2023-02-08 DIAGNOSIS — I10 Essential (primary) hypertension: Secondary | ICD-10-CM | POA: Diagnosis not present

## 2023-02-08 NOTE — Telephone Encounter (Signed)
I attempted to call the patient but she did not answer. LVM to return call

## 2023-02-15 ENCOUNTER — Encounter: Payer: Self-pay | Admitting: Student in an Organized Health Care Education/Training Program

## 2023-02-15 ENCOUNTER — Ambulatory Visit
Payer: Medicare HMO | Attending: Student in an Organized Health Care Education/Training Program | Admitting: Student in an Organized Health Care Education/Training Program

## 2023-02-15 VITALS — BP 135/73 | HR 73 | Temp 97.3°F | Resp 14 | Ht 64.0 in | Wt 184.0 lb

## 2023-02-15 DIAGNOSIS — M546 Pain in thoracic spine: Secondary | ICD-10-CM | POA: Insufficient documentation

## 2023-02-15 DIAGNOSIS — G894 Chronic pain syndrome: Secondary | ICD-10-CM | POA: Diagnosis not present

## 2023-02-15 DIAGNOSIS — Z9889 Other specified postprocedural states: Secondary | ICD-10-CM | POA: Insufficient documentation

## 2023-02-15 DIAGNOSIS — M47816 Spondylosis without myelopathy or radiculopathy, lumbar region: Secondary | ICD-10-CM | POA: Diagnosis not present

## 2023-02-15 DIAGNOSIS — M5136 Other intervertebral disc degeneration, lumbar region with discogenic back pain only: Secondary | ICD-10-CM | POA: Insufficient documentation

## 2023-02-15 DIAGNOSIS — G8929 Other chronic pain: Secondary | ICD-10-CM | POA: Diagnosis not present

## 2023-02-15 MED ORDER — GABAPENTIN 300 MG PO CAPS: 300.0000 mg | ORAL_CAPSULE | Freq: Three times a day (TID) | ORAL | 5 refills | Status: DC

## 2023-02-15 NOTE — Progress Notes (Signed)
PROVIDER NOTE: Information contained herein reflects review and annotations entered in association with encounter. Interpretation of such information and data should be left to medically-trained personnel. Information provided to patient can be located elsewhere in the medical record under "Patient Instructions". Document created using STT-dictation technology, any transcriptional errors that may result from process are unintentional.    Patient: Tonya Bowen  Service Category: E/M  Provider: Edward Jolly, MD  DOB: 06/25/1945  DOS: 09/04/2020  Specialty: Interventional Pain Management  MRN: [id number]  Setting: Ambulatory outpatient  PCP: Dorothey Baseman, MD  Type: Established Patient    Referring Provider: Dorothey Baseman, MD  Location: Office  Delivery: Face-to-face     HPI  Tonya Bowen, a 77 y.o. year old female, is here today because of her S/P kyphoplasty [Z98.890]. Tonya Bowen primary complain today is Back Pain (lower)  Last encounter: My last encounter with her was on 08/26/22 Pertinent problems: Tonya Bowen has S/P kyphoplasty; Chronic midline thoracic back pain; Lumbar degenerative disc disease; Lumbar facet arthropathy; Chronic pain syndrome; and Malignant neoplasm of upper-outer quadrant of left breast in female, estrogen receptor positive (HCC) on their pertinent problem list. Pain Assessment: Severity of Chronic pain is reported as a 2 /10.  Low back pain which she describes as throbbing, does get worse in the evening and with facet loading. Vitals:  height is 5\' 4"  (1.626 m) and weight is 184 lb (83.5 kg). Her temporal temperature is 97.3 F (36.3 C) (abnormal). Her blood pressure is 135/73 and her pulse is 73. Her respiration is 14 and oxygen saturation is 99%.   Reason for encounter: medication management.    Patient follows up today for management of gabapentin.     History of Present Illness   The patient, diagnosed with lumbar osteoarthropathy and lumbar degenerative  disc disease, has been on gabapentin for pain management. She reports no major changes in her medical history and believes the gabapentin is beneficial. She denies experiencing any side effects such as leg swelling or vision changes, although she does have a concurrent diagnosis of glaucoma.  The patient's other chronic conditions, including hypertension and diabetes, are reportedly well-managed. She recently had a consultation with internal medicine, and no issues were raised during that visit.  The patient did not express any new concerns or symptoms during this consultation. The patient did not report experiencing any of these side effects.       ROS  Constitutional: Denies any fever or chills Gastrointestinal: No reported hemesis, hematochezia, vomiting, or acute GI distress Musculoskeletal:  Positive low back pain Neurological: No reported episodes of acute onset apraxia, aphasia, dysarthria, agnosia, amnesia, paralysis, loss of coordination, or loss of consciousness  Medication Review  Calcium Carb-Cholecalciferol, Semaglutide, Vitamin D (Ergocalciferol), acetaminophen, amLODipine, amoxicillin-clavulanate, anastrozole, cetirizine, cyanocobalamin, dorzolamide-timolol, gabapentin, lisinopril, and meloxicam  History Review  Allergy: Tonya Bowen has No Known Allergies. Drug: Tonya Bowen  reports no history of drug use. Alcohol:  reports current alcohol use of about 2.0 standard drinks of alcohol per week. Tobacco:  reports that she has never smoked. She has never used smokeless tobacco. Social: Tonya Bowen  reports that she has never smoked. She has never used smokeless tobacco. She reports current alcohol use of about 2.0 standard drinks of alcohol per week. She reports that she does not use drugs. Medical:  has a past medical history of Arthritis, Cancer (HCC) (12/27/2019), Chronic back pain, Complication of anesthesia, Diabetes mellitus without complication (HCC), Family history of breast  cancer, Family history of lung cancer, Family history of uterine cancer, GERD (gastroesophageal reflux disease), Hypertension, and Stomach ulcer. Surgical: Tonya Bowen  has a past surgical history that includes Abdominal hysterectomy; Tonsillectomy; Bunionectomy (Bilateral); Breast enhancement surgery; Cataract extraction (Left); Radial keratotomy; colonoscopy; Upper gi endoscopy; Kyphoplasty (N/A, 02/06/2014); Augmentation mammaplasty (Bilateral, 1987); Breast lumpectomy with sentinel lymph node bx (Left, 02/04/2020); Breast lumpectomy (Left, 01/2020); Glaucoma surgery (01/26/2022); Colonoscopy (N/A, 06/14/2022); and Esophagogastroduodenoscopy (N/A, 06/14/2022). Family: family history includes ALS in her daughter; Breast cancer in her cousin, cousin, cousin, paternal aunt, paternal aunt, and paternal aunt; Lung cancer in her brother and cousin; Uterine cancer in her mother.  Laboratory Chemistry Profile   Renal Lab Results  Component Value Date   BUN 35 (H) 01/17/2023   CREATININE 1.12 (H) 01/17/2023   GFRAA >90 01/30/2014   GFRNONAA 51 (L) 01/17/2023     Hepatic Lab Results  Component Value Date   AST 19 01/17/2023   ALT 13 01/17/2023   ALBUMIN 3.8 01/17/2023   ALKPHOS 77 01/17/2023     Electrolytes Lab Results  Component Value Date   NA 141 01/17/2023   K 4.2 01/17/2023   CL 107 01/17/2023   CALCIUM 9.3 01/17/2023     Bone Lab Results  Component Value Date   VD25OH 44.49 07/15/2022     Inflammation (CRP: Acute Phase) (ESR: Chronic Phase) Lab Results  Component Value Date   CRP 0.6 12/09/2021   ESRSEDRATE 15 12/09/2021       Note: Above Lab results reviewed.  Recent Imaging Review  DG Ribs Unilateral W/Chest Left CLINICAL DATA:  Fall, left upper rib pain shortness of breath  EXAM: LEFT RIBS AND CHEST - 3+ VIEW  COMPARISON:  01/30/2014  FINDINGS: No displaced fracture or other bone lesions are seen involving the ribs. Possible nondisplaced fracture of the  anterolateral left third rib seen on oblique view only. There is no evidence of pneumothorax or pleural effusion. Diffuse bilateral interstitial pulmonary opacity. Heart size and mediastinal contours are within normal limits.  IMPRESSION: 1. No displaced fracture of the ribs. Possible nondisplaced fracture of the anterolateral left third rib seen on oblique view only. 2. Diffuse bilateral interstitial pulmonary opacity, consistent with edema or atypical/viral infection. No focal airspace opacity. No pneumothorax or pleural effusion.  Electronically Signed   By: Jearld Lesch M.D.   On: 01/08/2023 16:09  Note: Reviewed        Physical Exam  General appearance: Well nourished, well developed, and well hydrated. In no apparent acute distress Mental status: Alert, oriented x 3 (person, place, & time)       Respiratory: No evidence of acute respiratory distress Eyes: PERLA Vitals: BP 135/73   Pulse 73   Temp (!) 97.3 F (36.3 C) (Temporal)   Resp 14   Ht 5\' 4"  (1.626 m)   Wt 184 lb (83.5 kg)   SpO2 99%   BMI 31.58 kg/m  BMI: Estimated body mass index is 31.58 kg/m as calculated from the following:   Height as of this encounter: 5\' 4"  (1.626 m).   Weight as of this encounter: 184 lb (83.5 kg). Ideal: Ideal body weight: 54.7 kg (120 lb 9.5 oz) Adjusted ideal body weight: 66.2 kg (145 lb 15.3 oz)    Lumbar Exam  Skin & Axial Inspection: No masses, redness, or swelling Alignment: Symmetrical Functional ROM: Pain restricted ROM       Stability: No instability detected Muscle Tone/Strength: Functionally intact. No obvious neuro-muscular anomalies detected.  Sensory (Neurological): Musculoskeletal pain pattern Palpation: Complains of area being tender to palpation             Lower Extremity Exam      Side: Right lower extremity   Side: Left lower extremity  Stability: No instability observed           Stability: No instability observed          Skin & Extremity Inspection:  Skin color, temperature, and hair growth are WNL. No peripheral edema or cyanosis. No masses, redness, swelling, asymmetry, or associated skin lesions. No contractures.   Skin & Extremity Inspection: Skin color, temperature, and hair growth are WNL. No peripheral edema or cyanosis. No masses, redness, swelling, asymmetry, or associated skin lesions. No contractures.  Functional ROM: Unrestricted ROM                   Functional ROM: Unrestricted ROM                  Muscle Tone/Strength: Functionally intact. No obvious neuro-muscular anomalies detected.   Muscle Tone/Strength: Functionally intact. No obvious neuro-muscular anomalies detected.  Sensory (Neurological): Unimpaired         Sensory (Neurological): Unimpaired        DTR: Patellar: deferred today Achilles: deferred today Plantar: deferred today   DTR: Patellar: deferred today Achilles: deferred today Plantar: deferred today  Palpation: No palpable anomalies   Palpation: No palpable anomalies      Assessment   Status Diagnosis  Controlled Controlled Controlled 1. S/P kyphoplasty (L2 done in 2015 with Dr Shon Baton)   2. Degeneration of intervertebral disc of lumbar region with discogenic back pain   3. Lumbar facet arthropathy   4. Chronic midline thoracic back pain   5. Chronic pain syndrome          Plan of Care  Tonya Bowen has a current medication list which includes the following long-term medication(s): calcium carb-cholecalciferol, cetirizine, lisinopril, and gabapentin.  Pharmacotherapy (Medications Ordered): Meds ordered this encounter  Medications   gabapentin (NEURONTIN) 300 MG capsule    Sig: Take 1 capsule (300 mg total) by mouth 3 (three) times daily.    Dispense:  90 capsule    Refill:  5   Assessment and Plan    Low Back Pain related to Lumbar Osteoarthropathy and Lumbar Degenerative Disc Disease   Gabapentin effectively manages her chronic low back pain due to lumbar osteoarthropathy and lumbar  degenerative disc disease without significant side effects. She reports no new symptoms such as leg swelling or vision changes. We will refill gabapentin for six months and renew the urine toxicology screen for medication compliance and monitoring.  Glaucoma   Glaucoma complicates the assessment of vision changes potentially related to gabapentin.  Hypertension   Her hypertension is well-managed.  Diabetes Mellitus   Her diabetes is well-managed.  Follow-up   We will schedule a follow-up appointment in six months.        Follow-up plan:   Return in about 6 months (around 08/16/2023) for MM, F2F.   Recent Visits No visits were found meeting these conditions. Showing recent visits within past 90 days and meeting all other requirements Today's Visits Date Type Provider Dept  02/15/23 Office Visit Edward Jolly, MD [facility]  Showing today's visits and meeting all other requirements Future Appointments No visits were found meeting these conditions. Showing future appointments within next 90 days and meeting all other requirements  I discussed the  assessment and treatment plan with the patient. The patient was provided an opportunity to ask questions and all were answered. The patient agreed with the plan and demonstrated an understanding of the instructions.  Patient advised to call back or seek an in-person evaluation if the symptoms or condition worsens.  Duration of encounter:15 minutes.  Note by: Edward Jolly, MD Date: 02/15/2023; Time: 2:13 PM

## 2023-02-22 ENCOUNTER — Encounter: Payer: Self-pay | Admitting: Oncology

## 2023-02-22 ENCOUNTER — Ambulatory Visit: Payer: Medicare HMO | Admitting: Plastic Surgery

## 2023-02-22 ENCOUNTER — Encounter: Payer: Self-pay | Admitting: Plastic Surgery

## 2023-02-22 DIAGNOSIS — Z170 Estrogen receptor positive status [ER+]: Secondary | ICD-10-CM | POA: Diagnosis not present

## 2023-02-22 DIAGNOSIS — C50412 Malignant neoplasm of upper-outer quadrant of left female breast: Secondary | ICD-10-CM

## 2023-02-22 NOTE — Progress Notes (Signed)
   Subjective:    Patient ID: Tonya Bowen, female    DOB: 22-Aug-1945, 77 y.o.   MRN: [id number]  The patient is a 77 year old female joining me by phone.  Patient had a partial left mastectomy at [facility] 30 years ago.  She had bilateral implants placed that might be silicone.  She does not know the size of the implants.  She is 5 feet 4 inches tall and weighs 186 pounds.  The breast cancer was left breast.  She had a recent mammogram which showed rupture of bilateral implants.  She was trying to decide whether or not to replace the implants.  She has grade 3 ptosis of her breasts now and would likely have that after the implants are removed.      Review of Systems  Constitutional: Negative.   HENT: Negative.    Eyes: Negative.   Respiratory: Negative.  Negative for chest tightness.   Cardiovascular: Negative.   Gastrointestinal: Negative.   Endocrine: Negative.   Genitourinary: Negative.        Objective:   Physical Exam       Assessment & Plan:     ICD-10-CM   1. Malignant neoplasm of upper-outer quadrant of left breast in female, estrogen receptor positive (HCC)  C50.412    Z17.0       I connected with  [name] on 02/22/23 by phone and verified that I am speaking with the correct person using two identifiers. The patient was at home and I was at the office.  Spent 15 minutes in discussion.   Patient would like to have the implants removed with capsulectomies and a mastopexy.  She is thinking that she will not replace the implants and I think that is a very good idea. I discussed the limitations of evaluation and management by telemedicine. The patient expressed understanding and agreed to proceed.

## 2023-04-05 ENCOUNTER — Encounter: Payer: Self-pay | Admitting: Oncology

## 2023-04-18 ENCOUNTER — Encounter: Payer: Self-pay | Admitting: Oncology

## 2023-04-26 NOTE — Addendum Note (Signed)
Addended by: Peggye Form on: 04/26/2023 10:48 AM   Modules accepted: Orders

## 2023-05-30 ENCOUNTER — Ambulatory Visit: Payer: Medicare HMO | Admitting: Radiation Oncology

## 2023-05-31 ENCOUNTER — Encounter: Payer: Self-pay | Admitting: Plastic Surgery

## 2023-05-31 ENCOUNTER — Ambulatory Visit: Payer: Medicare Other | Admitting: Plastic Surgery

## 2023-05-31 DIAGNOSIS — T8543XA Leakage of breast prosthesis and implant, initial encounter: Secondary | ICD-10-CM | POA: Diagnosis not present

## 2023-05-31 DIAGNOSIS — Z170 Estrogen receptor positive status [ER+]: Secondary | ICD-10-CM

## 2023-05-31 DIAGNOSIS — C50412 Malignant neoplasm of upper-outer quadrant of left female breast: Secondary | ICD-10-CM

## 2023-05-31 NOTE — Progress Notes (Signed)
   Subjective:    Patient ID: Tonya Bowen, female    DOB: 1945-08-04, 78 y.o.   MRN: [id number]  The patient is a 78 year old female here for further evaluation of her breast.  She underwent implant placement approximately 30 years ago at [facility].  She then had a diagnosis of breast cancer of the left breast in the upper outer quadrant.  It was estrogen positive.  Her genetic testing was negative in 2021.  She was treated with a partial mastectomy and radiation of the left breast.  That finished about 2 years ago.  She has recent imaging showing rupture of both breast implants.  She is 5 feet 4 inches tall and weighs 186 pounds.  She is wanting to have the implants removed and replaced with a bilateral mastopexy.      Review of Systems  Constitutional: Negative.   Eyes: Negative.   Respiratory: Negative.    Cardiovascular: Negative.   Gastrointestinal: Negative.   Endocrine: Negative.   Genitourinary: Negative.        Objective:   Physical Exam Constitutional:      Appearance: Normal appearance.  HENT:     Head: Atraumatic.  Cardiovascular:     Rate and Rhythm: Normal rate.  Skin:    Capillary Refill: Capillary refill takes less than 2 seconds.  Neurological:     Mental Status: She is alert and oriented to person, place, and time.  Psychiatric:        Mood and Affect: Mood normal.        Behavior: Behavior normal.        Thought Content: Thought content normal.        Judgment: Judgment normal.       Assessment & Plan:     ICD-10-CM   1. Malignant neoplasm of upper-outer quadrant of left breast in female, estrogen receptor positive (HCC)  C50.412    Z17.0     2. Ruptured silicone breast implant, initial encounter  T85.43XA       The risks and complications were reviewed especially considering the radiation to the left breast and the risk of loss of nipple areola on the left breast.  This would result in her having to have a tattoo for reconstruction.  The patient would  like to proceed with removal of bilateral breast implants lateral liposuction for improved symmetry replacement of the implants and a bilateral mastopexy.

## 2023-06-30 ENCOUNTER — Encounter: Payer: Self-pay | Admitting: Radiation Oncology

## 2023-06-30 ENCOUNTER — Ambulatory Visit
Admission: RE | Admit: 2023-06-30 | Discharge: 2023-06-30 | Disposition: A | Discharge status: Home / Self Care | Payer: Self-pay | Source: Ambulatory Visit | Attending: Radiation Oncology | Admitting: Radiation Oncology

## 2023-06-30 VITALS — BP 121/74 | HR 71 | Temp 97.4°F | Resp 16 | Wt 184.0 lb

## 2023-06-30 DIAGNOSIS — Z923 Personal history of irradiation: Secondary | ICD-10-CM | POA: Diagnosis not present

## 2023-06-30 DIAGNOSIS — Z170 Estrogen receptor positive status [ER+]: Secondary | ICD-10-CM | POA: Insufficient documentation

## 2023-06-30 DIAGNOSIS — Z79811 Long term (current) use of aromatase inhibitors: Secondary | ICD-10-CM | POA: Diagnosis not present

## 2023-06-30 DIAGNOSIS — C50412 Malignant neoplasm of upper-outer quadrant of left female breast: Secondary | ICD-10-CM | POA: Insufficient documentation

## 2023-06-30 NOTE — Progress Notes (Signed)
 Radiation Oncology Follow up Note  Name: Tonya Bowen   Date:   06/30/2023 MRN:  [id number] DOB: 28-Feb-1946    This 78 y.o. female presents to the clinic today for 3-year follow-up status post whole breast radiation to her left breast for stage Ib ER/PR positive invasive mammary carcinoma.  REFERRING PROVIDER: Rory Collard, MD  HPI: Patient is a 78 year old female now out over 3 years having completed whole breast radiation to her left breast for stage Ib ER/PR positive invasive mammary carcinoma.  Seen today in routine follow-up she is doing well.  She specifically denies breast tenderness cough or bone pain..  She is currently on Arimidex tolerating it well without side effect.  She is planning on have breast augmentation through Dr. Orin Birk.  Her last mammograms which I have reviewed were BI-RADS 2 benign COMPLICATIONS OF TREATMENT: none  FOLLOW UP COMPLIANCE: keeps appointments   PHYSICAL EXAM:  BP 121/74   Pulse 71   Temp (!) 97.4 F (36.3 C) (Tympanic)   Resp 16   Wt 184 lb (83.5 kg)   BMI 31.58 kg/m  Lungs are clear to A&P cardiac examination essentially unremarkable with regular rate and rhythm. No dominant mass or nodularity is noted in either breast in 2 positions examined. Incision is well-healed. No axillary or supraclavicular adenopathy is appreciated. Cosmetic result is excellent.  She does have a lumpectomy cavity that still palpable unchanged over prior exam.  Well-developed well-nourished patient in NAD. HEENT reveals PERLA, EOMI, discs not visualized.  Oral cavity is clear. No oral mucosal lesions are identified. Neck is clear without evidence of cervical or supraclavicular adenopathy. Lungs are clear to A&P. Cardiac examination is essentially unremarkable with regular rate and rhythm without murmur rub or thrill. Abdomen is benign with no organomegaly or masses noted. Motor sensory and DTR levels are equal and symmetric in the upper and lower extremities. Cranial  nerves II through XII are grossly intact. Proprioception is intact. No peripheral adenopathy or edema is identified. No motor or sensory levels are noted. Crude visual fields are within normal range.  RADIOLOGY RESULTS: Mammograms reviewed compatible with above-stated findings  PLAN: The present time patient is doing well with no evidence of disease now out over 3 years.  I am going to turn follow-up care over to her other physicians.  Be happy to reevaluate her at any time should that be indicated.  Patient knows to call with any concerns.  She continues on Arimidex without side effect.  I would like to take this opportunity to thank you for allowing me to participate in the care of your patient.Glenis Langdon, MD

## 2023-07-06 NOTE — Progress Notes (Signed)
 Patient ID: Tonya Bowen, female    DOB: 11-26-1945, 78 y.o.   MRN: [id number]  No chief complaint on file.   No diagnosis found.   History of Present Illness: Tonya Bowen is a 78 y.o.  female  with a history of left breast cancer.  She presents for preoperative evaluation for upcoming procedure, bilateral removal and replacement of implants with bilateral mastopexy and liposuction, scheduled for 08/01/2023 with Dr. Orin Birk.  The patient {HAS HAS ION:62952} had problems with anesthesia. ***  Summary of Previous Visit: Patient was seen for initial consult by Dr. Orin Birk on 01/28/2023.  At this visit, it was noted that patient underwent left partial mastectomy about 30 years ago.  Patient was reconstructed with bilateral breast implants.  Patient was unsure of what size they were.  Patient believes that they were silicone implants.  Patient did have radiation therapy.  Patient had a mammogram in September 2024 which was negative for malignancy, but showed bilateral extracapsular ruptures of the implants.  Patient was requesting removal of the implants with a mastopexy.  Patient then had a telephone visit again on 02/22/2023.  Patient reported that she wanted to have the implants removed for capsulectomies and a mastopexy.  She was thinking that she would not replace the implants.  Patient was then seen again on 05/31/2023.  At this visit, patient stated that she wanted to have the implants removed and replaced with the bilateral mastopexy.  Patient wanted to move forward with removal of bilateral breast implants, lateral liposuction for improved symmetry and replacement of the implants and a bilateral mastopexy.  Job: ***  PMH Significant for: Hypertension, left breast cancer, chronic pain syndrome   Past Medical History: Allergies: No Known Allergies  Current Medications:  Current Outpatient Medications:    acetaminophen  (TYLENOL ) 325 MG tablet, Take 650 mg by mouth every 6  (six) hours as needed for moderate pain. , Disp: , Rfl:    amLODipine  (NORVASC ) 5 MG tablet, Take 1 tablet by mouth daily., Disp: , Rfl:    anastrozole  (ARIMIDEX ) 1 MG tablet, Take 1 tablet (1 mg total) by mouth daily., Disp: 90 tablet, Rfl: 1   Calcium Carb-Cholecalciferol (CALCIUM 500 + D PO), Take 2 tablets by mouth daily., Disp: , Rfl:    cetirizine (ZYRTEC) 10 MG tablet, Take 10 mg by mouth every morning., Disp: , Rfl:    cyanocobalamin (VITAMIN B12) 1000 MCG tablet, Take 1,000 mcg by mouth daily., Disp: , Rfl:    dorzolamide-timolol (COSOPT) 22.3-6.8 MG/ML ophthalmic solution, Place 1 drop into the right eye 2 (two) times daily., Disp: , Rfl:    gabapentin  (NEURONTIN ) 300 MG capsule, Take 1 capsule (300 mg total) by mouth 3 (three) times daily., Disp: 90 capsule, Rfl: 5   lisinopril (ZESTRIL) 10 MG tablet, Take 10 mg by mouth daily., Disp: , Rfl:    meloxicam  (MOBIC ) 7.5 MG tablet, Take 1 tablet (7.5 mg total) by mouth daily., Disp: 30 tablet, Rfl: 0   Semaglutide (OZEMPIC, 0.25 OR 0.5 MG/DOSE, [place]), Inject 0.25 mg into the skin once a week. (Patient not taking: Reported on 06/30/2023), Disp: , Rfl:    Vitamin D , Ergocalciferol , (DRISDOL) 1.25 MG (50000 UNIT) CAPS capsule, Take 50,000 Units by mouth once a week., Disp: , Rfl:   Past Medical Problems: Past Medical History:  Diagnosis Date   Arthritis    Cancer (HCC) 12/27/2019   left breast   Chronic back pain    Complication of anesthesia  pt states she felt every thing during breast augmentation surgery   Diabetes mellitus without complication (HCC)    Family history of breast cancer    Family history of lung cancer    Family history of uterine cancer    GERD (gastroesophageal reflux disease)    Hypertension    Stomach ulcer     Past Surgical History: Past Surgical History:  Procedure Laterality Date   ABDOMINAL HYSTERECTOMY     AUGMENTATION MAMMAPLASTY Bilateral 1987   BREAST ENHANCEMENT SURGERY     BREAST LUMPECTOMY Left  01/2020   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   BREAST LUMPECTOMY WITH SENTINEL LYMPH NODE BIOPSY Left 02/04/2020   Procedure: BREAST LUMPECTOMY WITH SENTINEL LYMPH NODE BX;  Surgeon: Marshall Skeeter, MD;  Location: [facility] ORS;  Service: General;  Laterality: Left;   BUNIONECTOMY Bilateral    Great toes   CATARACT EXTRACTION Left    colonoscopy     COLONOSCOPY N/A 06/14/2022   Procedure: COLONOSCOPY;  Surgeon: Quintin Buckle, DO;  Location: [facility] ENDOSCOPY;  Service: Gastroenterology;  Laterality: N/A;   ESOPHAGOGASTRODUODENOSCOPY N/A 06/14/2022   Procedure: ESOPHAGOGASTRODUODENOSCOPY (EGD);  Surgeon: Quintin Buckle, DO;  Location: [facility];  Service: Gastroenterology;  Laterality: N/A;   GLAUCOMA SURGERY  01/26/2022   KYPHOPLASTY N/A 02/06/2014   Procedure:  L2 KYPHOPLASTY;  Surgeon: Mort Ards, MD;  Location: [facility] OR;  Service: Orthopedics;  Laterality: N/A;   RADIAL KERATOTOMY     TONSILLECTOMY     UPPER GI ENDOSCOPY      Social History: Social History   Socioeconomic History   Marital status: Divorced    Spouse name: Not on file   Number of children: Not on file   Years of education: Not on file   Highest education level: Not on file  Occupational History   Not on file  Tobacco Use   Smoking status: Never   Smokeless tobacco: Never  Vaping Use   Vaping status: Never Used  Substance and Sexual Activity   Alcohol use: Yes    Alcohol/week: 2.0 standard drinks of alcohol    Types: 2 Glasses of wine per week    Comment: socially   Drug use: No   Sexual activity: Not Currently  Other Topics Concern   Not on file  Social History Narrative   Not on file   Social Drivers of Health   Financial Resource Strain: Patient Declined (05/23/2023)   Received from [facility]   Overall Financial Resource Strain (CARDIA)    Difficulty of Paying Living Expenses: Patient declined  Food Insecurity: Patient Declined  (05/23/2023)   Received from [facility]   Hunger Vital Sign    Worried About Running Out of Food in the Last Year: Patient declined    Ran Out of Food in the Last Year: Patient declined  Transportation Needs: Patient Declined (05/23/2023)   Received from [facility] - Transportation    In the past 12 months, has lack of transportation kept you from medical appointments or from getting medications?: Patient declined    Lack of Transportation (Non-Medical): Patient declined  Physical Activity: Not on file  Stress: Not on file  Social Connections: Not on file  Intimate Partner Violence: Not on file    Family History: Family History  Problem Relation Age of Onset   Breast cancer Paternal Aunt    Breast cancer Paternal Aunt    Uterine cancer Mother  probable   Lung cancer Brother        metastatic disease; unsure if lung was pimary   Breast cancer Paternal Aunt    ALS Daughter    Breast cancer Cousin        dx 66s   Breast cancer Cousin        dx 43s   Breast cancer Cousin        dx 9s   Lung cancer Cousin     Review of Systems: ROS  Physical Exam: Vital Signs There were no vitals taken for this visit.  Physical Exam *** Constitutional:      General: Not in acute distress.    Appearance: Normal appearance. Not ill-appearing.  HENT:     Head: Normocephalic and atraumatic.  Eyes:     Pupils: Pupils are equal, round Neck:     Musculoskeletal: Normal range of motion.  Cardiovascular:     Rate and Rhythm: Normal rate    Pulses: Normal pulses.  Pulmonary:     Effort: Pulmonary effort is normal. No respiratory distress.  Abdominal:     General: Abdomen is flat. There is no distension.  Musculoskeletal: Normal range of motion.  Skin:    General: Skin is warm and dry.     Findings: No erythema or rash.  Neurological:     General: No focal deficit present.     Mental Status: Alert and oriented to person, place, and  time. Mental status is at baseline.     Motor: No weakness.  Psychiatric:        Mood and Affect: Mood normal.        Behavior: Behavior normal.    Assessment/Plan: The patient is scheduled for *** with Dr. {ZOXWR:60454::"[id number]","[id number]"}.  Risks, benefits, and alternatives of procedure discussed, questions answered and consent obtained.    Smoking Status: ***; Counseling Given? *** Last Mammogram: ***; Results: ***  Caprini Score: ***; Risk Factors include: ***, BMI *** 25, and length of planned surgery. Recommendation for mechanical *** prophylaxis. Encourage early ambulation.   Pictures obtained: @consult ***  Post-op Rx sent to pharmacy: {Blank:19197::"Oxycodone , Zofran , Keflex ","Oxycodone , Zofran "}  Patient was provided with the *** General Surgical Risk consent document and Pain Medication Agreement prior to their appointment.  They had adequate time to read through the risk consent documents and Pain Medication Agreement. We also discussed them in person together during this preop appointment. All of their questions were answered to their satisfaction.  Recommended calling if they have any further questions.  Risk consent form and Pain Medication Agreement to be scanned into patient's chart.  ***   Electronically signed by: Harden Leyden, PA-C 07/06/2023 9:52 AM

## 2023-07-07 ENCOUNTER — Ambulatory Visit (INDEPENDENT_AMBULATORY_CARE_PROVIDER_SITE_OTHER): Admitting: Student

## 2023-07-07 VITALS — BP 122/59 | HR 56 | Ht 64.0 in | Wt 187.0 lb

## 2023-07-07 DIAGNOSIS — C50412 Malignant neoplasm of upper-outer quadrant of left female breast: Secondary | ICD-10-CM

## 2023-07-07 DIAGNOSIS — Z170 Estrogen receptor positive status [ER+]: Secondary | ICD-10-CM

## 2023-07-07 MED ORDER — ONDANSETRON HCL 4 MG PO TABS: 4.0000 mg | ORAL_TABLET | Freq: Three times a day (TID) | ORAL | 0 refills | Status: AC | PRN

## 2023-07-07 MED ORDER — CEPHALEXIN 500 MG PO CAPS
500.0000 mg | ORAL_CAPSULE | Freq: Four times a day (QID) | ORAL | 0 refills | Status: AC
Start: 2023-07-07 — End: 2023-07-10

## 2023-07-07 MED ORDER — TRAMADOL HCL 50 MG PO TABS: 50.0000 mg | ORAL_TABLET | Freq: Three times a day (TID) | ORAL | 0 refills | Status: DC | PRN

## 2023-07-15 ENCOUNTER — Ambulatory Visit (INDEPENDENT_AMBULATORY_CARE_PROVIDER_SITE_OTHER): Admitting: Plastic Surgery

## 2023-07-15 ENCOUNTER — Encounter: Payer: Self-pay | Admitting: Plastic Surgery

## 2023-07-15 DIAGNOSIS — T8543XA Leakage of breast prosthesis and implant, initial encounter: Secondary | ICD-10-CM

## 2023-07-15 NOTE — Addendum Note (Signed)
 Addended by: Thornell Flirt on: 07/15/2023 02:56 PM   Modules accepted: Orders

## 2023-07-15 NOTE — Progress Notes (Addendum)
 I left a message for the patient.  The patient called back in we were able to discuss her options.  She would like to move ahead with the plan from her last visit. At the last visit the patient wanted to have the implants exchanged with a mastopexy and liposuction.  She is aware she could change her mind open to the day of surgery and not have implants placed if she decides.   I connected with  [name] on 07/15/23 by phone and verified that I am speaking with the correct person using two identifiers. The patient was at home and I was at the office and we spent 5 minutes in discussion.I discussed the limitations of evaluation and management by telemedicine. The patient expressed understanding and agreed to proceed.

## 2023-07-18 ENCOUNTER — Other Ambulatory Visit: Payer: Self-pay | Admitting: Oncology

## 2023-07-18 ENCOUNTER — Inpatient Hospital Stay: Payer: Medicare HMO | Attending: Oncology

## 2023-07-18 ENCOUNTER — Inpatient Hospital Stay (HOSPITAL_BASED_OUTPATIENT_CLINIC_OR_DEPARTMENT_OTHER): Payer: Medicare HMO | Admitting: Oncology

## 2023-07-18 ENCOUNTER — Inpatient Hospital Stay: Payer: Medicare HMO

## 2023-07-18 ENCOUNTER — Encounter: Payer: Self-pay | Admitting: Oncology

## 2023-07-18 VITALS — BP 144/61 | HR 59 | Temp 97.9°F | Resp 16 | Wt 192.0 lb

## 2023-07-18 DIAGNOSIS — C50412 Malignant neoplasm of upper-outer quadrant of left female breast: Secondary | ICD-10-CM

## 2023-07-18 DIAGNOSIS — Z1721 Progesterone receptor positive status: Secondary | ICD-10-CM | POA: Diagnosis not present

## 2023-07-18 DIAGNOSIS — Z79811 Long term (current) use of aromatase inhibitors: Secondary | ICD-10-CM

## 2023-07-18 DIAGNOSIS — M8580 Other specified disorders of bone density and structure, unspecified site: Secondary | ICD-10-CM | POA: Diagnosis not present

## 2023-07-18 DIAGNOSIS — Z1732 Human epidermal growth factor receptor 2 negative status: Secondary | ICD-10-CM | POA: Diagnosis not present

## 2023-07-18 DIAGNOSIS — Z170 Estrogen receptor positive status [ER+]: Secondary | ICD-10-CM | POA: Insufficient documentation

## 2023-07-18 LAB — CBC WITH DIFFERENTIAL (CANCER CENTER ONLY)
Abs Immature Granulocytes: 0.02 10*3/uL (ref 0.00–0.07)
Basophils Absolute: 0.1 10*3/uL (ref 0.0–0.1)
Basophils Relative: 1 %
Eosinophils Absolute: 0.3 10*3/uL (ref 0.0–0.5)
Eosinophils Relative: 4 %
HCT: 39.4 % (ref 36.0–46.0)
Hemoglobin: 12.9 g/dL (ref 12.0–15.0)
Immature Granulocytes: 0 %
Lymphocytes Relative: 17 %
Lymphs Abs: 1.2 10*3/uL (ref 0.7–4.0)
MCH: 30.7 pg (ref 26.0–34.0)
MCHC: 32.7 g/dL (ref 30.0–36.0)
MCV: 93.8 fL (ref 80.0–100.0)
Monocytes Absolute: 0.5 10*3/uL (ref 0.1–1.0)
Monocytes Relative: 7 %
Neutro Abs: 5.1 10*3/uL (ref 1.7–7.7)
Neutrophils Relative %: 71 %
Platelet Count: 258 10*3/uL (ref 150–400)
RBC: 4.2 MIL/uL (ref 3.87–5.11)
RDW: 12.7 % (ref 11.5–15.5)
WBC Count: 7.1 10*3/uL (ref 4.0–10.5)
nRBC: 0 % (ref 0.0–0.2)

## 2023-07-18 LAB — CMP (CANCER CENTER ONLY)
ALT: 18 U/L (ref 0–44)
AST: 23 U/L (ref 15–41)
Albumin: 3.9 g/dL (ref 3.5–5.0)
Alkaline Phosphatase: 58 U/L (ref 38–126)
Anion gap: 8 (ref 5–15)
BUN: 22 mg/dL (ref 8–23)
CO2: 26 mmol/L (ref 22–32)
Calcium: 8.9 mg/dL (ref 8.9–10.3)
Chloride: 107 mmol/L (ref 98–111)
Creatinine: 0.92 mg/dL (ref 0.44–1.00)
GFR, Estimated: >60 mL/min (ref >60)
Glucose, Bld: 106 mg/dL — ABNORMAL HIGH (ref 70–99)
Potassium: 4.2 mmol/L (ref 3.5–5.1)
Sodium: 141 mmol/L (ref 135–145)
Total Bilirubin: 0.7 mg/dL (ref 0.0–1.2)
Total Protein: 6.6 g/dL (ref 6.5–8.1)

## 2023-07-18 MED ORDER — DENOSUMAB 60 MG/ML ~~LOC~~ SOSY
60.0000 mg | PREFILLED_SYRINGE | Freq: Once | SUBCUTANEOUS | Status: AC
  Administered 2023-07-18: 60 mg via SUBCUTANEOUS
  Filled 2023-07-18: qty 1

## 2023-07-18 NOTE — Assessment & Plan Note (Signed)
Recommend calcium 1000-1200 mgand vitmain D supplementation.

## 2023-07-18 NOTE — Assessment & Plan Note (Addendum)
 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 s/p adjuvant radiation. Labs are reviewed and discussed with patient. Continue Arimidex  1mg  daily She has implants and there is plan for implant removal.

## 2023-07-18 NOTE — Assessment & Plan Note (Signed)
 10/30/2019, DEXA showed osteopenia, 10-year risk of any major fracture 15%, 10-year risk of hip fracture 2.3%.   Continue calcium 1200 mg daily and vitamin D  supplementation. Proceed with Prolia  Repeat DEXA

## 2023-07-18 NOTE — Progress Notes (Signed)
 Hematology/Oncology Progress note [phone] [phone]     CHIEF COMPLAINTS/REASON FOR VISIT:  Follow-up for breast cancer management   ASSESSMENT & PLAN:   Cancer Staging  Malignant neoplasm of upper-outer quadrant of left breast in female, estrogen receptor positive (HCC) Staging form: Breast, AJCC 8th Edition - Clinical: Stage IB (cT2, cN0, cM0, G2, ER+, PR+, HER2-) - Signed by Timmy Forbes, MD on 01/03/2020 - Pathologic stage from 02/04/2020: Stage IA (pT2, pN0, cM0, G2, ER+, PR+, HER2-, Oncotype DX score: 5) - Signed by Timmy Forbes, MD on 12/16/2021   Malignant neoplasm of upper-outer quadrant of left breast in female, estrogen receptor positive (HCC) 01/2020 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 s/p adjuvant radiation. Labs are reviewed and discussed with patient. Continue Arimidex  1mg  daily She has implants and there is plan for implant removal.   Hypocalcemia Recommend calcium 1000-1200 mgand vitmain D supplementation.   Osteopenia 10/30/2019, DEXA showed osteopenia, 10-year risk of any major fracture 15%, 10-year risk of hip fracture 2.3%.   Continue calcium 1200 mg daily and vitamin D  supplementation. Proceed with Prolia  Repeat DEXA  Orders Placed This Encounter  Procedures   DG Bone Density    Standing Status:   Future    Expected Date:   07/25/2023    Expiration Date:   07/17/2024    Reason for Exam (SYMPTOM  OR DIAGNOSIS REQUIRED):   aromatase inhibitor use. Hx breast cancer    Preferred imaging location?:   [facility]   CMP (Cancer Center only)    Standing Status:   Future    Expected Date:   01/18/2024    Expiration Date:   07/17/2024   CBC with Differential (Cancer Center Only)    Standing Status:   Future    Expected Date:   01/18/2024    Expiration Date:   07/17/2024   Follow up in 6 months.  All questions were answered. The patient knows to call  the clinic with any problems, questions or concerns.  Timmy Forbes, MD, PhD [facility] Hematology Oncology 07/18/2023    HISTORY OF PRESENTING ILLNESS:   Tonya Bowen is a  78 y.o.  female with presents for follow up of evaluation of breast cancer Oncology History  Malignant neoplasm of upper-outer quadrant of left breast in female, estrogen receptor positive (HCC)  12/19/2019 Mammogram   Unilateral left breast diagnostic mammogram showed  22mm irregular left upper outer breast mass, no left axillary lymphadenopathy.   01/03/2020 Initial Diagnosis   Malignant neoplasm of upper-outer quadrant of left breast in female, estrogen receptor positive  Family history of breast cancer: paternal aunt x 2 breast cancer Family history of other cancers: brother lung cancer, mother possible uterine cancer Menarche: 13 Menopause: hystectomy in 79s Age at first live childbirth:23 Used OCP: about 10 years Used estrogen and progesterone therapy: 20+ years HRT History of Radiation to the chest: none Previous of breast biopsy: none   01/03/2020 Cancer Staging   Staging form: Breast, AJCC 8th Edition - Clinical: Stage IB (cT2, cN0, cM0, G2, ER+, PR+, HER2-) - Signed by Timmy Forbes, MD on 01/03/2020   01/21/2020 Genetic Testing   Negative genetic testing. No pathogenic variants identified on the [facility] Common Hereditary Cancers Panel +EGFR. The report date is 01/21/2020.  The Common Hereditary Cancers Panel offered by [facility] includes sequencing and/or deletion duplication testing of the following 48 genes: APC, ATM, AXIN2, BARD1, BMPR1A, BRCA1, BRCA2,  BRIP1, CDH1, CDKN2A (p14ARF), CDKN2A (p16INK4a), CKD4, CHEK2, CTNNA1, DICER1, EGFR,  EPCAM (Deletion/duplication testing only), GREM1 (promoter region deletion/duplication testing only), KIT, MEN1, MLH1, MSH2, MSH3, MSH6, MUTYH, NBN, NF1, NHTL1, PALB2, PDGFRA, PMS2, POLD1, POLE, PTEN, RAD50, RAD51C, RAD51D, RNF43, SDHB, SDHC, SDHD, SMAD4, SMARCA4. STK11, TP53,  TSC1, TSC2, and VHL.  The following genes were evaluated for sequence changes only: SDHA and HOXB13 c.251G>A variant only.   02/04/2020 Cancer Staging   Staging form: Breast, AJCC 8th Edition - Pathologic stage from 02/04/2020: Stage IA (pT2, pN0, cM0, G2, ER+, PR+, HER2-, Oncotype DX score: 5) - Signed by Timmy Forbes, MD on 12/16/2021 Stage prefix: Initial diagnosis Multigene prognostic tests performed: Oncotype DX Recurrence score range: Less than 11 Histologic grading system: 3 grade system    - 04/29/2020 Radiation Therapy   finished adjuvant radiation therapy   05/14/2020 -  Anti-estrogen oral therapy   started on Arimidex    07/03/2021 Mammogram   Unilateral diagnostic mammogram /ultrasound left-for evaluation of left upper outer quadrant breast mass 1. No evidence of new or recurrent breast carcinoma. 2. Clinical and imaging findings are consistent with ongoing fat necrosis throughout the surgical bed    Imaging     12/07/2021 Mammogram   Bilateral diagnostic mammogram/implant abdominal bilateral -No evidence of malignancy in either breast     She has balance difficulties and she was recently seen by neurology Dr. Mason Sole on 06/04/2020.  Patient had work-up including SPEP and immunofixation.  Labs reviewed.  No M protein was identified.  - Reports a history of approximately "3 or 4" UTIs per year.  INTERVAL HISTORY Tonya Bowen is a 78 y.o. female who has above history reviewed by me today presents for follow up visit for management of left breast cancer, pT2N0 , ER/PR positive, HER-2 negative. Patient takes anastrozole  1 mg daily.  Overall tolerates well, manageable side effects. She had a fall and had rupture of breast implant. She has seen plastic surgeon and there is plan of removal of bilateral breast implants and bilateral mastopexy.     Review of Systems  Constitutional:  Negative for appetite change, chills, fatigue and fever.  HENT:   Negative for hearing loss and voice  change.   Eyes:  Negative for eye problems.  Respiratory:  Negative for chest tightness and cough.   Cardiovascular:  Negative for chest pain.  Gastrointestinal:  Negative for abdominal distention, abdominal pain and blood in stool.  Endocrine: Negative for hot flashes.  Genitourinary:  Negative for difficulty urinating, dysuria and frequency.   Musculoskeletal:  Positive for arthralgias and back pain.  Skin:  Negative for itching and rash.  Neurological:  Negative for extremity weakness.  Hematological:  Negative for adenopathy.  Psychiatric/Behavioral:  Negative for confusion.     MEDICAL HISTORY:  Past Medical History:  Diagnosis Date   Arthritis    Cancer (HCC) 12/27/2019   left breast   Chronic back pain    Complication of anesthesia    pt states she felt every thing during breast augmentation surgery   Diabetes mellitus without complication (HCC)    Family history of breast cancer    Family history of lung cancer    Family history of uterine cancer    GERD (gastroesophageal reflux disease)    Hypertension    Stomach ulcer     SURGICAL HISTORY: Past Surgical History:  Procedure Laterality Date   ABDOMINAL HYSTERECTOMY     AUGMENTATION MAMMAPLASTY Bilateral 1987   BREAST ENHANCEMENT SURGERY  BREAST LUMPECTOMY Left 01/2020   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   BREAST LUMPECTOMY WITH SENTINEL LYMPH NODE BIOPSY Left 02/04/2020   Procedure: BREAST LUMPECTOMY WITH SENTINEL LYMPH NODE BX;  Surgeon: Marshall Skeeter, MD;  Location: [facility] ORS;  Service: General;  Laterality: Left;   BUNIONECTOMY Bilateral    Great toes   CATARACT EXTRACTION Left    colonoscopy     COLONOSCOPY N/A 06/14/2022   Procedure: COLONOSCOPY;  Surgeon: Quintin Buckle, DO;  Location: [facility] ENDOSCOPY;  Service: Gastroenterology;  Laterality: N/A;   ESOPHAGOGASTRODUODENOSCOPY N/A 06/14/2022   Procedure: ESOPHAGOGASTRODUODENOSCOPY (EGD);  Surgeon: Quintin Buckle,  DO;  Location: [facility] ENDOSCOPY;  Service: Gastroenterology;  Laterality: N/A;   GLAUCOMA SURGERY  01/26/2022   KYPHOPLASTY N/A 02/06/2014   Procedure:  L2 KYPHOPLASTY;  Surgeon: Mort Ards, MD;  Location: [facility] OR;  Service: Orthopedics;  Laterality: N/A;   RADIAL KERATOTOMY     TONSILLECTOMY     UPPER GI ENDOSCOPY      SOCIAL HISTORY: Social History   Socioeconomic History   Marital status: Divorced    Spouse name: Not on file   Number of children: Not on file   Years of education: Not on file   Highest education level: Not on file  Occupational History   Not on file  Tobacco Use   Smoking status: Never   Smokeless tobacco: Never  Vaping Use   Vaping status: Never Used  Substance and Sexual Activity   Alcohol use: Yes    Alcohol/week: 2.0 standard drinks of alcohol    Types: 2 Glasses of wine per week    Comment: socially   Drug use: No   Sexual activity: Not Currently  Other Topics Concern   Not on file  Social History Narrative   Not on file   Social Drivers of Health   Financial Resource Strain: Patient Declined (05/23/2023)   Received from [facility]   Overall Financial Resource Strain (CARDIA)    Difficulty of Paying Living Expenses: Patient declined  Food Insecurity: Patient Declined (05/23/2023)   [facility] from [facility]   Hunger Vital Sign    Worried About Running Out of Food in the Last Year: Patient declined    Ran Out of Food in the Last Year: Patient declined  Transportation Needs: Patient Declined (05/23/2023)   Received from [facility] - Transportation    In the past 12 months, has lack of transportation kept you from medical appointments or from getting medications?: Patient declined    Lack of Transportation (Non-Medical): Patient declined  Physical Activity: Not on file  Stress: Not on file  Social Connections: Not on file  Intimate Partner Violence: Not on file    FAMILY  HISTORY: Family History  Problem Relation Age of Onset   Breast cancer Paternal Aunt    Breast cancer Paternal Aunt    Uterine cancer Mother        probable   Lung cancer Brother        metastatic disease; unsure if lung was pimary   Breast cancer Paternal Aunt    ALS Daughter    Breast cancer Cousin        dx 18s   Breast cancer Cousin        dx 69s   Breast cancer Cousin        dx 93s   Lung cancer Cousin     ALLERGIES:  has  no known allergies.  MEDICATIONS:  Current Outpatient Medications  Medication Sig Dispense Refill   acetaminophen  (TYLENOL ) 325 MG tablet Take 650 mg by mouth every 6 (six) hours as needed for moderate pain.      amLODipine  (NORVASC ) 5 MG tablet Take 1 tablet by mouth daily.     Calcium Carb-Cholecalciferol (CALCIUM 500 + D PO) Take 2 tablets by mouth daily.     cetirizine (ZYRTEC) 10 MG tablet Take 10 mg by mouth every morning.     cyanocobalamin (VITAMIN B12) 1000 MCG tablet Take 1,000 mcg by mouth daily.     dorzolamide-timolol (COSOPT) 22.3-6.8 MG/ML ophthalmic solution Place 1 drop into the right eye 2 (two) times daily.     gabapentin  (NEURONTIN ) 300 MG capsule Take 1 capsule (300 mg total) by mouth 3 (three) times daily. 90 capsule 5   lisinopril (ZESTRIL) 10 MG tablet Take 10 mg by mouth daily.     ondansetron  (ZOFRAN ) 4 MG tablet Take 1 tablet (4 mg total) by mouth every 8 (eight) hours as needed for up to 20 doses for nausea or vomiting. 20 tablet 0   traMADol  (ULTRAM ) 50 MG tablet Take 1 tablet (50 mg total) by mouth every 8 (eight) hours as needed for up to 20 doses for moderate pain (pain score 4-6) or severe pain (pain score 7-10). 20 tablet 0   Vitamin D , Ergocalciferol , (DRISDOL) 1.25 MG (50000 UNIT) CAPS capsule Take 50,000 Units by mouth once a week.     anastrozole  (ARIMIDEX ) 1 MG tablet Take 1 tablet by mouth once daily 90 tablet 0   meloxicam  (MOBIC ) 7.5 MG tablet Take 1 tablet (7.5 mg total) by mouth daily. (Patient not taking:  Reported on 07/18/2023) 30 tablet 0   Semaglutide (OZEMPIC, 0.25 OR 0.5 MG/DOSE, [place]) Inject 0.25 mg into the skin once a week. (Patient not taking: Reported on 06/30/2023)     No current facility-administered medications for this visit.     PHYSICAL EXAMINATION: ECOG PERFORMANCE STATUS: 1 - Symptomatic but completely ambulatory Vitals:   07/18/23 1309  BP: (!) 144/61  Pulse: (!) 59  Resp: 16  Temp: 97.9 F (36.6 C)  SpO2: 100%   Filed Weights   07/18/23 1309  Weight: 192 lb (87.1 kg)    Physical Exam Constitutional:      General: She is not in acute distress. HENT:     Head: Normocephalic and atraumatic.  Eyes:     General: No scleral icterus. Cardiovascular:     Rate and Rhythm: Normal rate and regular rhythm.     Heart sounds: Normal heart sounds.  Pulmonary:     Effort: Pulmonary effort is normal. No respiratory distress.     Breath sounds: No wheezing.  Abdominal:     General: Bowel sounds are normal. There is no distension.     Palpations: Abdomen is soft.  Musculoskeletal:        General: No deformity. Normal range of motion.     Cervical back: Normal range of motion and neck supple.  Skin:    General: Skin is warm and dry.     Findings: No erythema or rash.  Neurological:     Mental Status: She is alert and oriented to person, place, and time. Mental status is at baseline.     Cranial Nerves: No cranial nerve deficit.     Coordination: Coordination normal.  Psychiatric:        Mood and Affect: Mood normal.    Breast exam  was performed in seated and lying down position. Patient is status post left lumpectomy with a well-healed surgical scar.  History of breast augmentation with implants. .  There is palpable area of tissue thickening at the site of lumpectomy. No discrete mass palpable in right breast.  No palpable lymphadenopathy bilateral axillary  LABORATORY DATA:  I have reviewed the data as listed    Latest Ref Rng & Units 07/18/2023   12:59 PM  01/17/2023   12:47 PM 07/15/2022   12:52 PM  CBC  WBC 4.0 - 10.5 K/uL 7.1  7.1  8.5   Hemoglobin 12.0 - 15.0 g/dL 62.1  30.8  65.7   Hematocrit 36.0 - 46.0 % 39.4  36.7  43.1   Platelets 150 - 400 K/uL 258  269  281       Latest Ref Rng & Units 07/18/2023   12:59 PM 01/17/2023   12:47 PM 07/15/2022   12:52 PM  CMP  Glucose 70 - 99 mg/dL 846  92  962   BUN 8 - 23 mg/dL 22  35  19   Creatinine 0.44 - 1.00 mg/dL 9.52  8.41  3.24   Sodium 135 - 145 mmol/L 141  141  142   Potassium 3.5 - 5.1 mmol/L 4.2  4.2  4.1   Chloride 98 - 111 mmol/L 107  107  108   CO2 22 - 32 mmol/L 26  28  26    Calcium 8.9 - 10.3 mg/dL 8.9  9.3  8.5   Total Protein 6.5 - 8.1 g/dL 6.6  6.5    Total Bilirubin 0.0 - 1.2 mg/dL 0.7  0.6    Alkaline Phos 38 - 126 U/L 58  77    AST 15 - 41 U/L 23  19    ALT 0 - 44 U/L 18  13       RADIOGRAPHIC STUDIES: I have personally reviewed the radiological images as listed and agreed with the findings in the report. No results found.

## 2023-07-27 ENCOUNTER — Other Ambulatory Visit: Payer: Self-pay

## 2023-07-27 MED ORDER — ANASTROZOLE 1 MG PO TABS: 1.0000 mg | ORAL_TABLET | Freq: Every day | ORAL | 0 refills | Status: DC

## 2023-08-01 DIAGNOSIS — T8543XA Leakage of breast prosthesis and implant, initial encounter: Secondary | ICD-10-CM | POA: Diagnosis not present

## 2023-08-09 ENCOUNTER — Ambulatory Visit (INDEPENDENT_AMBULATORY_CARE_PROVIDER_SITE_OTHER): Admitting: Student

## 2023-08-09 VITALS — BP 124/78 | HR 69

## 2023-08-09 DIAGNOSIS — T8543XA Leakage of breast prosthesis and implant, initial encounter: Secondary | ICD-10-CM

## 2023-08-09 NOTE — Progress Notes (Signed)
 Patient is a 78 year old female who underwent bilateral removal and replacement of implants with bilateral mastopexy and liposuction with Dr. Orin Birk on 08/01/2023. Intraoperatively, patient had [place] smooth round high profile 175 cc gel implants placed bilaterally. She is about a week and a half postop.  She presents to the clinic today for postoperative follow-up.  Today, patient reports she is doing well. She denies any fevers or chills. She denies any pain or drainage. She reports that she has a rash on her abdomen, and she thinks it might be a bug bite.   Chaperone present on exam. On exam, patient is sitting upright in no acute distress. Breasts are soft and symmetric. There is no overlying erythema. There are no fluid collections palpated on exam. There is some mild over ecchymosis. NACs appear to healthy bilaterally. Dressings are clean, dry and intact. These were removed. Steri strips are intact over the incisions. Some of the Steri strips were removed while removing the dressing. The steri strips were replaced. There are no signs of infection on exam.   Recommended that patient continue with compression bra at all times and avoid strenuous activities.   Recommend that patient follow back up in about two weeks.   Instructed her to call if she has any questions or concerns.

## 2023-08-10 ENCOUNTER — Ambulatory Visit: Payer: Self-pay | Attending: Nurse Practitioner | Admitting: Nurse Practitioner

## 2023-08-10 ENCOUNTER — Encounter: Payer: Self-pay | Admitting: Nurse Practitioner

## 2023-08-10 VITALS — BP 119/48 | HR 65 | Temp 97.9°F | Resp 16 | Ht 64.0 in | Wt 188.0 lb

## 2023-08-10 DIAGNOSIS — G894 Chronic pain syndrome: Secondary | ICD-10-CM

## 2023-08-10 DIAGNOSIS — Z9889 Other specified postprocedural states: Secondary | ICD-10-CM | POA: Diagnosis present

## 2023-08-10 DIAGNOSIS — M5136 Other intervertebral disc degeneration, lumbar region with discogenic back pain only: Secondary | ICD-10-CM

## 2023-08-10 DIAGNOSIS — G8929 Other chronic pain: Secondary | ICD-10-CM | POA: Diagnosis present

## 2023-08-10 DIAGNOSIS — M546 Pain in thoracic spine: Secondary | ICD-10-CM | POA: Insufficient documentation

## 2023-08-10 DIAGNOSIS — M5134 Other intervertebral disc degeneration, thoracic region: Secondary | ICD-10-CM | POA: Diagnosis present

## 2023-08-10 DIAGNOSIS — M47816 Spondylosis without myelopathy or radiculopathy, lumbar region: Secondary | ICD-10-CM

## 2023-08-10 MED ORDER — GABAPENTIN 300 MG PO CAPS: 300.0000 mg | ORAL_CAPSULE | Freq: Three times a day (TID) | ORAL | 5 refills | Status: DC

## 2023-08-10 NOTE — Progress Notes (Signed)
 Safety precautions to be maintained throughout the outpatient stay will include: orient to surroundings, keep bed in low position, maintain call bell within reach at all times, provide assistance with transfer out of bed and ambulation.

## 2023-08-10 NOTE — Progress Notes (Signed)
 PROVIDER NOTE: Interpretation of information contained herein should be left to medically-trained personnel. Specific patient instructions are provided elsewhere under "Patient Instructions" section of medical record. This document was created in part using AI and STT-dictation technology, any transcriptional errors that may result from this process are unintentional.  Patient: Tonya Bowen  Service: E/M   PCP: Rory Collard, MD  DOB: 29-Oct-1945  DOS: 08/10/2023  Provider: Cherylin Corrigan, NP  MRN: [id number]  Delivery: Face-to-face  Specialty: Interventional Pain Management  Type: Established Patient  Setting: Ambulatory outpatient facility  Specialty designation: 09  Referring Prov.: Rory Collard, MD  Location: Outpatient office facility       History of present illness (HPI) Tonya Bowen, a 78 y.o. year old female, is here today because of her S/P kyphoplasty [Z98.890]. Tonya Bowen primary complain today is Back Pain and Headache (left)  Pertinent problems: Tonya Bowen does not have any pertinent problems on file.  Pain Assessment: Severity of   is reported as a 0-No pain/10. Location: Back Mid, Lower/sometimes to shoulders. Onset: More than a month ago. Quality: Aching, Sharp. Timing: Intermittent. Modifying factor(s): gaba. Vitals:  height is 5\' 4"  (1.626 m) and weight is 188 lb (85.3 kg). Her temperature is 97.9 F (36.6 C). Her blood pressure is 119/48 (abnormal) and her pulse is 65. Her respiration is 16 and oxygen saturation is 99%.  BMI: Estimated body mass index is 32.27 kg/m as calculated from the following:   Height as of this encounter: 5\' 4"  (1.626 m).   Weight as of this encounter: 188 lb (85.3 kg).  Last encounter: 02/15/2023 Last procedure: Visit date not found.  Reason for encounter: medication management. No change in medical history since last visit.  Patient's pain is at baseline.  Patient continues multimodal pain regimen as prescribed.  Patient low up today for  management of gabapentin .  The patient has other chronic conditions, including hypertension and diabetes, which are reportedly well-managed by her internal medicine provider.  Pharmacotherapy Assessment   Analgesic: Gabapentin  (Neurontin ) 300 mg capsule 3 times daily.  Monitoring: [place] PMP: PDMP not reviewed this encounter.       Pharmacotherapy: No side-effects or adverse reactions reported. Compliance: No problems identified. Effectiveness: Clinically acceptable.  Lennis Rabon, RN  08/10/2023  1:43 PM  Sign when Signing Visit Safety precautions to be maintained throughout the outpatient stay will include: orient to surroundings, keep bed in low position, maintain call bell within reach at all times, provide assistance with transfer out of bed and ambulation.     No results found for: "CBDTHCR" No results found for: "D8THCCBX" No results found for: "D9THCCBX"  UDS:  No results found for: "SUMMARY"   ROS  Constitutional: Denies any fever or chills Gastrointestinal: No reported hemesis, hematochezia, vomiting, or acute GI distress Musculoskeletal: Low back pain, headache (left side) Neurological: No reported episodes of acute onset apraxia, aphasia, dysarthria, agnosia, amnesia, paralysis, loss of coordination, or loss of consciousness  Medication Review  Calcium Carb-Cholecalciferol, Semaglutide, Vitamin D  (Ergocalciferol ), acetaminophen , amLODipine , anastrozole , cetirizine, cyanocobalamin, dorzolamide-timolol, gabapentin , lisinopril, and ondansetron   History Review  Allergy: Tonya Bowen has no known allergies. Drug: Tonya Bowen  reports no history of drug use. Alcohol:  reports current alcohol use of about 2.0 standard drinks of alcohol per week. Tobacco:  reports that she has never smoked. She has never used smokeless tobacco. Social: Tonya Bowen  reports that she has never smoked. She has never used smokeless tobacco. She reports current alcohol use  of about 2.0 standard drinks of  alcohol per week. She reports that she does not use drugs. Medical:  has a past medical history of Arthritis, Cancer (HCC) (12/27/2019), Chronic back pain, Complication of anesthesia, Diabetes mellitus without complication (HCC), Family history of breast cancer, Family history of lung cancer, Family history of uterine cancer, GERD (gastroesophageal reflux disease), Hypertension, and Stomach ulcer. Surgical: Tonya Bowen  has a past surgical history that includes Abdominal hysterectomy; Tonsillectomy; Bunionectomy (Bilateral); Breast enhancement surgery; Cataract extraction (Left); Radial keratotomy; colonoscopy; Upper gi endoscopy; Kyphoplasty (N/A, 02/06/2014); Augmentation mammaplasty (Bilateral, 1987); Breast lumpectomy with sentinel lymph node bx (Left, 02/04/2020); Breast lumpectomy (Left, 01/2020); Glaucoma surgery (01/26/2022); Colonoscopy (N/A, 06/14/2022); and Esophagogastroduodenoscopy (N/A, 06/14/2022). Family: family history includes ALS in her daughter; Breast cancer in her cousin, cousin, cousin, paternal aunt, paternal aunt, and paternal aunt; Lung cancer in her brother and cousin; Uterine cancer in her mother.  Laboratory Chemistry Profile   Renal Lab Results  Component Value Date   BUN 22 07/18/2023   CREATININE 0.92 07/18/2023   GFRAA >90 01/30/2014   GFRNONAA >60 07/18/2023    Hepatic Lab Results  Component Value Date   AST 23 07/18/2023   ALT 18 07/18/2023   ALBUMIN 3.9 07/18/2023   ALKPHOS 58 07/18/2023    Electrolytes Lab Results  Component Value Date   NA 141 07/18/2023   K 4.2 07/18/2023   CL 107 07/18/2023   CALCIUM 8.9 07/18/2023    Bone Lab Results  Component Value Date   VD25OH 44.49 07/15/2022    Inflammation (CRP: Acute Phase) (ESR: Chronic Phase) Lab Results  Component Value Date   CRP 0.6 12/09/2021   ESRSEDRATE 15 12/09/2021         Note: Above Lab results reviewed.  Recent Imaging Review  DG Ribs Unilateral W/Chest Left CLINICAL DATA:  Fall,  left upper rib pain shortness of breath  EXAM: LEFT RIBS AND CHEST - 3+ VIEW  COMPARISON:  01/30/2014  FINDINGS: No displaced fracture or other bone lesions are seen involving the ribs. Possible nondisplaced fracture of the anterolateral left third rib seen on oblique view only. There is no evidence of pneumothorax or pleural effusion. Diffuse bilateral interstitial pulmonary opacity. Heart size and mediastinal contours are within normal limits.  IMPRESSION: 1. No displaced fracture of the ribs. Possible nondisplaced fracture of the anterolateral left third rib seen on oblique view only. 2. Diffuse bilateral interstitial pulmonary opacity, consistent with edema or atypical/viral infection. No focal airspace opacity. No pneumothorax or pleural effusion.  Electronically Signed   By: Fredricka Jenny M.D.   On: 01/08/2023 16:09 Note: Reviewed         Physical Exam  General appearance: Well nourished, well developed, and well hydrated. In no apparent acute distress Mental status: Alert, oriented x 3 (person, place, & time)       Respiratory: No evidence of acute respiratory distress Eyes: PERLA Vitals: BP (!) 119/48   Pulse 65   Temp 97.9 F (36.6 C)   Resp 16   Ht 5\' 4"  (1.626 m)   Wt 188 lb (85.3 kg)   SpO2 99%   BMI 32.27 kg/m  BMI: Estimated body mass index is 32.27 kg/m as calculated from the following:   Height as of this encounter: 5\' 4"  (1.626 m).   Weight as of this encounter: 188 lb (85.3 kg). Ideal: Ideal body weight: 54.7 kg (120 lb 9.5 oz) Adjusted ideal body weight: 66.9 kg (147 lb 8.9 oz)  Assessment  Diagnosis Status  1. S/P kyphoplasty (L2 done in 2015 with Dr Vaughn Georges)   2. Degeneration of intervertebral disc of lumbar region with discogenic back pain   3. Lumbar facet arthropathy   4. Chronic midline thoracic back pain   5. Chronic pain syndrome   6. Other intervertebral disc degeneration, thoracic region    Controlled Controlled Controlled    Updated Problems: No problems updated.  Plan of Care  Problem-specific:  Assessment and Plan We will continue on current medication regimen.  The patient did not express any new concerns or symptoms during this consultation and reported no side effects from current medication.   She is on gabapentin  300 Mg total by mouth 3 times daily.    Tonya Bowen has a current medication list which includes the following long-term medication(s): calcium carb-cholecalciferol, cetirizine, lisinopril, and gabapentin .  Pharmacotherapy (Medications Ordered): Meds ordered this encounter  Medications   gabapentin  (NEURONTIN ) 300 MG capsule    Sig: Take 1 capsule (300 mg total) by mouth 3 (three) times daily.    Dispense:  90 capsule    Refill:  5   Orders:  No orders of the defined types were placed in this encounter.       Return in about 6 months (around 02/10/2024) for (F2F), (MM), Marthe Slain NP.    Recent Visits No visits were found meeting these conditions. Showing recent visits within past 90 days and meeting all other requirements Today's Visits Date Type Provider Dept  08/10/23 Office Visit Falan Hensler K, NP [facility]  Showing today's visits and meeting all other requirements Future Appointments No visits were found meeting these conditions. Showing future appointments within next 90 days and meeting all other requirements  I discussed the assessment and treatment plan with the patient. The patient was provided an opportunity to ask questions and all were answered. The patient agreed with the plan and demonstrated an understanding of the instructions.  Patient advised to call back or seek an in-person evaluation if the symptoms or condition worsens.  Duration of encounter: 30 minutes.  Total time on encounter, as per AMA guidelines included both the face-to-face and non-face-to-face time personally spent by the physician and/or other qualified health care  professional(s) on the day of the encounter (includes time in activities that require the physician or other qualified health care professional and does not include time in activities normally performed by clinical staff). Physician's time may include the following activities when performed: Preparing to see the patient (e.g., pre-charting review of records, searching for previously ordered imaging, lab work, and nerve conduction tests) Review of prior analgesic pharmacotherapies. Reviewing PMP Interpreting ordered tests (e.g., lab work, imaging, nerve conduction tests) Performing post-procedure evaluations, including interpretation of diagnostic procedures Obtaining and/or reviewing separately obtained history Performing a medically appropriate examination and/or evaluation Counseling and educating the patient/family/caregiver Ordering medications, tests, or procedures Referring and communicating with other health care professionals (when not separately reported) Documenting clinical information in the electronic or other health record Independently interpreting results (not separately reported) and communicating results to the patient/ family/caregiver Care coordination (not separately reported)  Note by: Brayant Dorr K Resa Rinks, NP (TTS and AI technology used. I apologize for any typographical errors that were not detected and corrected.) Date: 08/10/2023; Time: 2:49 PM

## 2023-08-11 ENCOUNTER — Encounter: Payer: Medicare HMO | Admitting: Student in an Organized Health Care Education/Training Program

## 2023-08-18 ENCOUNTER — Ambulatory Visit
Admission: RE | Admit: 2023-08-18 | Discharge: 2023-08-18 | Disposition: A | Discharge status: Home / Self Care | Source: Ambulatory Visit | Attending: Oncology | Admitting: Oncology

## 2023-08-18 DIAGNOSIS — Z79811 Long term (current) use of aromatase inhibitors: Secondary | ICD-10-CM | POA: Insufficient documentation

## 2023-08-18 DIAGNOSIS — C50412 Malignant neoplasm of upper-outer quadrant of left female breast: Secondary | ICD-10-CM | POA: Insufficient documentation

## 2023-08-18 DIAGNOSIS — Z170 Estrogen receptor positive status [ER+]: Secondary | ICD-10-CM | POA: Diagnosis present

## 2023-08-23 ENCOUNTER — Ambulatory Visit: Payer: Self-pay | Admitting: Plastic Surgery

## 2023-08-23 DIAGNOSIS — T8543XA Leakage of breast prosthesis and implant, initial encounter: Secondary | ICD-10-CM

## 2023-08-23 NOTE — Progress Notes (Signed)
 The patient is a 78 year old female here for follow-up after undergoing removal replacement of implants with mastopexy.  Overall she is doing really well.  I was able to show her the pictures of the implants.  There is no sign of a hematoma or infection.  She should continue with the sports bra and we will plan to see her back in 2 weeks.  Pictures were obtained of the patient and placed in the chart with the patient's or guardian's permission.

## 2023-09-05 NOTE — Progress Notes (Unsigned)
 Patient is a 78 year old female who underwent bilateral removal and replacement of implants with bilateral mastopexy and liposuction with Dr. Lowery on 08/01/2023. Intraoperatively, patient had Mentor smooth round high profile 175 cc gel implants placed bilaterally.  Patient is 5 weeks postop.  She presents to the clinic today for postoperative follow-up.  Patient was last seen in the clinic on 08/23/2023.  At this visit, patient was doing well.  There is no sign of hematoma or infection.  Today,

## 2023-09-06 ENCOUNTER — Ambulatory Visit (INDEPENDENT_AMBULATORY_CARE_PROVIDER_SITE_OTHER): Admitting: Student

## 2023-09-06 VITALS — BP 123/67 | HR 67

## 2023-09-06 DIAGNOSIS — T8543XA Leakage of breast prosthesis and implant, initial encounter: Secondary | ICD-10-CM

## 2023-10-11 ENCOUNTER — Other Ambulatory Visit: Payer: Self-pay | Admitting: General Surgery

## 2023-10-11 DIAGNOSIS — Z1231 Encounter for screening mammogram for malignant neoplasm of breast: Secondary | ICD-10-CM

## 2023-10-15 ENCOUNTER — Other Ambulatory Visit: Payer: Self-pay | Admitting: Oncology

## 2023-10-17 ENCOUNTER — Encounter: Payer: Self-pay | Admitting: Oncology

## 2023-10-31 ENCOUNTER — Ambulatory Visit (INDEPENDENT_AMBULATORY_CARE_PROVIDER_SITE_OTHER): Payer: Self-pay

## 2023-10-31 DIAGNOSIS — Z719 Counseling, unspecified: Secondary | ICD-10-CM

## 2023-11-09 ENCOUNTER — Other Ambulatory Visit: Payer: Self-pay | Admitting: Oncology

## 2023-11-10 ENCOUNTER — Encounter: Payer: Self-pay | Admitting: Oncology

## 2023-12-12 ENCOUNTER — Encounter

## 2023-12-13 ENCOUNTER — Ambulatory Visit
Admission: RE | Admit: 2023-12-13 | Discharge: 2023-12-13 | Disposition: A | Discharge status: Home / Self Care | Source: Ambulatory Visit | Attending: General Surgery | Admitting: General Surgery

## 2023-12-13 ENCOUNTER — Other Ambulatory Visit: Payer: Self-pay | Admitting: General Surgery

## 2023-12-13 DIAGNOSIS — Z1231 Encounter for screening mammogram for malignant neoplasm of breast: Secondary | ICD-10-CM | POA: Insufficient documentation

## 2024-01-17 ENCOUNTER — Inpatient Hospital Stay: Admitting: Oncology

## 2024-01-17 ENCOUNTER — Encounter: Payer: Self-pay | Admitting: Oncology

## 2024-01-17 ENCOUNTER — Inpatient Hospital Stay

## 2024-01-17 ENCOUNTER — Inpatient Hospital Stay: Attending: Oncology

## 2024-01-17 VITALS — BP 136/83 | HR 64 | Temp 98.0°F | Resp 16 | Wt 187.0 lb

## 2024-01-17 DIAGNOSIS — Z1721 Progesterone receptor positive status: Secondary | ICD-10-CM | POA: Diagnosis not present

## 2024-01-17 DIAGNOSIS — C50412 Malignant neoplasm of upper-outer quadrant of left female breast: Secondary | ICD-10-CM

## 2024-01-17 DIAGNOSIS — M8580 Other specified disorders of bone density and structure, unspecified site: Secondary | ICD-10-CM | POA: Diagnosis present

## 2024-01-17 DIAGNOSIS — Z1732 Human epidermal growth factor receptor 2 negative status: Secondary | ICD-10-CM | POA: Diagnosis not present

## 2024-01-17 DIAGNOSIS — Z170 Estrogen receptor positive status [ER+]: Secondary | ICD-10-CM | POA: Insufficient documentation

## 2024-01-17 LAB — CBC WITH DIFFERENTIAL (CANCER CENTER ONLY)
Abs Immature Granulocytes: 0.02 K/uL (ref 0.00–0.07)
Basophils Absolute: 0 K/uL (ref 0.0–0.1)
Basophils Relative: 1 %
Eosinophils Absolute: 0.5 K/uL (ref 0.0–0.5)
Eosinophils Relative: 6 %
HCT: 38.9 % (ref 36.0–46.0)
Hemoglobin: 12.9 g/dL (ref 12.0–15.0)
Immature Granulocytes: 0 %
Lymphocytes Relative: 15 %
Lymphs Abs: 1.1 K/uL (ref 0.7–4.0)
MCH: 31 pg (ref 26.0–34.0)
MCHC: 33.2 g/dL (ref 30.0–36.0)
MCV: 93.5 fL (ref 80.0–100.0)
Monocytes Absolute: 0.4 K/uL (ref 0.1–1.0)
Monocytes Relative: 5 %
Neutro Abs: 5.4 K/uL (ref 1.7–7.7)
Neutrophils Relative %: 73 %
Platelet Count: 254 K/uL (ref 150–400)
RBC: 4.16 MIL/uL (ref 3.87–5.11)
RDW: 13.1 % (ref 11.5–15.5)
WBC Count: 7.4 K/uL (ref 4.0–10.5)
nRBC: 0 % (ref 0.0–0.2)

## 2024-01-17 LAB — CMP (CANCER CENTER ONLY)
ALT: 12 U/L (ref 0–44)
AST: 22 U/L (ref 15–41)
Albumin: 3.5 g/dL (ref 3.5–5.0)
Alkaline Phosphatase: 68 U/L (ref 38–126)
Anion gap: 9 (ref 5–15)
BUN: 18 mg/dL (ref 8–23)
CO2: 26 mmol/L (ref 22–32)
Calcium: 8.8 mg/dL — ABNORMAL LOW (ref 8.9–10.3)
Chloride: 106 mmol/L (ref 98–111)
Creatinine: 0.95 mg/dL (ref 0.44–1.00)
GFR, Estimated: >60 mL/min (ref >60)
Glucose, Bld: 114 mg/dL — ABNORMAL HIGH (ref 70–99)
Potassium: 4 mmol/L (ref 3.5–5.1)
Sodium: 141 mmol/L (ref 135–145)
Total Bilirubin: 0.9 mg/dL (ref 0.0–1.2)
Total Protein: 6.4 g/dL — ABNORMAL LOW (ref 6.5–8.1)

## 2024-01-17 MED ORDER — DENOSUMAB 60 MG/ML ~~LOC~~ SOSY
60.0000 mg | PREFILLED_SYRINGE | SUBCUTANEOUS | Status: DC
  Administered 2024-01-17: 60 mg via SUBCUTANEOUS
  Filled 2024-01-17: qty 1

## 2024-01-17 NOTE — Assessment & Plan Note (Addendum)
 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 s/p adjuvant radiation. Labs are reviewed and discussed with patient. Continue Arimidex  1mg  daily [started 05/2020 ] Annual mammogram results were reviewed.

## 2024-01-17 NOTE — Assessment & Plan Note (Signed)
Recommend calcium 1000-1200 mgand vitmain D supplementation.

## 2024-01-17 NOTE — Progress Notes (Signed)
 Per Olam Pinal ok to proceed with prolia  today

## 2024-01-17 NOTE — Progress Notes (Signed)
 Hematology/Oncology Progress note [phone] [phone]     CHIEF COMPLAINTS/REASON FOR VISIT:  Follow-up for breast cancer management   ASSESSMENT & PLAN:   Cancer Staging  Malignant neoplasm of upper-outer quadrant of left breast in female, estrogen receptor positive (HCC) Staging form: Breast, AJCC 8th Edition - Clinical: Stage IB (cT2, cN0, cM0, G2, ER+, PR+, HER2-) - Signed by Babara Call, MD on 01/03/2020 - Pathologic stage from 02/04/2020: Stage IA (pT2, pN0, cM0, G2, ER+, PR+, HER2-, Oncotype DX score: 5) - Signed by Babara Call, MD on 12/16/2021   Malignant neoplasm of upper-outer quadrant of left breast in female, estrogen receptor positive (HCC) 01/2020 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 s/p adjuvant radiation. Labs are reviewed and discussed with patient. Continue Arimidex  1mg  daily leobardo 05/2020 ] Annual mammogram results were reviewed.    Osteopenia 10/30/2019, DEXA showed osteopenia, 10-year risk of any major fracture 15%, 10-year risk of hip fracture 2.3%.   Continue calcium 1200 mg daily and vitamin D  supplementation. Proceed with Prolia  Repeat DEXA  Hypocalcemia Recommend calcium 1000-1200 mgand vitmain D supplementation.   Orders Placed This Encounter  Procedures   CBC with Differential (Cancer Center Only)    Standing Status:   Future    Expected Date:   07/16/2024    Expiration Date:   10/14/2024   CMP (Cancer Center only)    Standing Status:   Future    Expected Date:   07/16/2024    Expiration Date:   10/14/2024   Follow up in 6 months.  All questions were answered. The patient knows to call the clinic with any problems, questions or concerns.  Call Babara, MD, PhD [facility] Hematology Oncology 01/17/2024    HISTORY OF PRESENTING ILLNESS:   Tonya Bowen is a  78 y.o.  female with presents for follow up of evaluation of breast  cancer Oncology History  Malignant neoplasm of upper-outer quadrant of left breast in female, estrogen receptor positive (HCC)  12/19/2019 Mammogram   Unilateral left breast diagnostic mammogram showed  22mm irregular left upper outer breast mass, no left axillary lymphadenopathy.   01/03/2020 Initial Diagnosis   Malignant neoplasm of upper-outer quadrant of left breast in female, estrogen receptor positive  Family history of breast cancer: paternal aunt x 2 breast cancer Family history of other cancers: brother lung cancer, mother possible uterine cancer Menarche: 13 Menopause: hystectomy in 63s Age at first live childbirth:23 Used OCP: about 10 years Used estrogen and progesterone therapy: 20+ years HRT History of Radiation to the chest: none Previous of breast biopsy: none   01/03/2020 Cancer Staging   Staging form: Breast, AJCC 8th Edition - Clinical: Stage IB (cT2, cN0, cM0, G2, ER+, PR+, HER2-) - Signed by Babara Call, MD on 01/03/2020   01/21/2020 Genetic Testing   Negative genetic testing. No pathogenic variants identified on the [facility] Common Hereditary Cancers Panel +EGFR. The report date is 01/21/2020.  The Common Hereditary Cancers Panel offered by [facility] includes sequencing and/or deletion duplication testing of the following 48 genes: APC, ATM, AXIN2, BARD1, BMPR1A, BRCA1, BRCA2, BRIP1, CDH1, CDKN2A (p14ARF), CDKN2A (p16INK4a), CKD4, CHEK2, CTNNA1, DICER1, EGFR,  EPCAM (Deletion/duplication testing only), GREM1 (promoter region deletion/duplication testing only), KIT, MEN1, MLH1, MSH2, MSH3, MSH6, MUTYH, NBN, NF1, NHTL1, PALB2, PDGFRA, PMS2, POLD1, POLE, PTEN, RAD50, RAD51C, RAD51D, RNF43, SDHB, SDHC, SDHD, SMAD4, SMARCA4. STK11, TP53, TSC1, TSC2, and VHL.  The following genes were evaluated  for sequence changes only: SDHA and HOXB13 c.251G>A variant only.   02/04/2020 Cancer Staging   Staging form: Breast, AJCC 8th Edition - Pathologic stage from 02/04/2020: Stage IA (pT2,  pN0, cM0, G2, ER+, PR+, HER2-, Oncotype DX score: 5) - Signed by Babara Call, MD on 12/16/2021 Stage prefix: Initial diagnosis Multigene prognostic tests performed: Oncotype DX Recurrence score range: Less than 11 Histologic grading system: 3 grade system    - 04/29/2020 Radiation Therapy   finished adjuvant radiation therapy   05/14/2020 -  Anti-estrogen oral therapy   started on Arimidex    07/03/2021 Mammogram   Unilateral diagnostic mammogram /ultrasound left-for evaluation of left upper outer quadrant breast mass 1. No evidence of new or recurrent breast carcinoma. 2. Clinical and imaging findings are consistent with ongoing fat necrosis throughout the surgical bed    Imaging     12/07/2021 Mammogram   Bilateral diagnostic mammogram/implant abdominal bilateral -No evidence of malignancy in either breast   11/19/2022 Imaging   Bilateral diagnostic mammogram w implant 1. No mammographic evidence of malignancy bilaterally. There is bilateral extracapsular rupture.     She has balance difficulties and she was recently seen by neurology Dr. Maree on 06/04/2020.  Patient had work-up including SPEP and immunofixation.  Labs reviewed.  No M protein was identified.  - Reports a history of approximately 3 or 4 UTIs per year.  INTERVAL HISTORY Tonya Bowen is a 78 y.o. female who has above history reviewed by me today presents for follow up visit for management of left breast cancer, pT2N0 , ER/PR positive, HER-2 negative. Patient takes anastrozole  1 mg daily.  Overall tolerates well, manageable side effects. S/p removal of bilateral breast implants and bilateral mastopexy, new implants placed. .     Review of Systems  Constitutional:  Negative for appetite change, chills, fatigue and fever.  HENT:   Negative for hearing loss and voice change.   Eyes:  Negative for eye problems.  Respiratory:  Negative for chest tightness and cough.   Cardiovascular:  Negative for chest pain.   Gastrointestinal:  Negative for abdominal distention, abdominal pain and blood in stool.  Endocrine: Negative for hot flashes.  Genitourinary:  Negative for difficulty urinating, dysuria and frequency.   Musculoskeletal:  Positive for arthralgias and back pain.  Skin:  Negative for itching and rash.  Neurological:  Negative for extremity weakness.  Hematological:  Negative for adenopathy.  Psychiatric/Behavioral:  Negative for confusion.     MEDICAL HISTORY:  Past Medical History:  Diagnosis Date   Arthritis    Cancer (HCC) 12/27/2019   left breast   Chronic back pain    Complication of anesthesia    pt states she felt every thing during breast augmentation surgery   Diabetes mellitus without complication (HCC)    Family history of breast cancer    Family history of lung cancer    Family history of uterine cancer    GERD (gastroesophageal reflux disease)    Hypertension    Stomach ulcer     SURGICAL HISTORY: Past Surgical History:  Procedure Laterality Date   ABDOMINAL HYSTERECTOMY     AUGMENTATION MAMMAPLASTY Bilateral 1987   BREAST ENHANCEMENT SURGERY     BREAST LUMPECTOMY Left 01/2020   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   BREAST LUMPECTOMY WITH SENTINEL LYMPH NODE BIOPSY Left 02/04/2020   Procedure: BREAST LUMPECTOMY WITH SENTINEL LYMPH NODE BX;  Surgeon: Dessa Reyes ORN, MD;  Location: [facility] ORS;  Service: General;  Laterality:  Left;   BUNIONECTOMY Bilateral    Great toes   CATARACT EXTRACTION Left    colonoscopy     COLONOSCOPY N/A 06/14/2022   Procedure: COLONOSCOPY;  Surgeon: Onita Elspeth Sharper, DO;  Location: [facility] ENDOSCOPY;  Service: Gastroenterology;  Laterality: N/A;   ESOPHAGOGASTRODUODENOSCOPY N/A 06/14/2022   Procedure: ESOPHAGOGASTRODUODENOSCOPY (EGD);  Surgeon: Onita Elspeth Sharper, DO;  Location: [facility] ENDOSCOPY;  Service: Gastroenterology;  Laterality: N/A;   GLAUCOMA SURGERY  01/26/2022   KYPHOPLASTY N/A 02/06/2014    Procedure:  L2 KYPHOPLASTY;  Surgeon: Donaciano Sprang, MD;  Location: [facility] OR;  Service: Orthopedics;  Laterality: N/A;   RADIAL KERATOTOMY     TONSILLECTOMY     UPPER GI ENDOSCOPY      SOCIAL HISTORY: Social History   Socioeconomic History   Marital status: Divorced    Spouse name: Not on file   Number of children: Not on file   Years of education: Not on file   Highest education level: Not on file  Occupational History   Not on file  Tobacco Use   Smoking status: Never   Smokeless tobacco: Never  Vaping Use   Vaping status: Never Used  Substance and Sexual Activity   Alcohol use: Yes    Alcohol/week: 2.0 standard drinks of alcohol    Types: 2 Glasses of wine per week    Comment: socially   Drug use: No   Sexual activity: Not Currently  Other Topics Concern   Not on file  Social History Narrative   Not on file   Social Drivers of Health   Financial Resource Strain: Patient Declined (05/23/2023)   Received from [facility]   Overall Financial Resource Strain (CARDIA)    Difficulty of Paying Living Expenses: Patient declined  Food Insecurity: Patient Declined (05/23/2023)   Received from [facility]   Hunger Vital Sign    Within the past 12 months, you worried that your food would run out before you got the money to buy more.: Patient declined    Within the past 12 months, the food you bought just didn't last and you didn't have money to get more.: Patient declined  Transportation Needs: Patient Declined (05/23/2023)   Received from [facility] - Transportation    In the past 12 months, has lack of transportation kept you from medical appointments or from getting medications?: Patient declined    Lack of Transportation (Non-Medical): Patient declined  Physical Activity: Not on file  Stress: Not on file  Social Connections: Not on file  Intimate Partner Violence: Not on file    FAMILY HISTORY: Family  History  Problem Relation Age of Onset   Breast cancer Paternal Aunt    Breast cancer Paternal Aunt    Uterine cancer Mother        probable   Lung cancer Brother        metastatic disease; unsure if lung was pimary   Breast cancer Paternal Aunt    ALS Daughter    Breast cancer Cousin        dx 66s   Breast cancer Cousin        dx 59s   Breast cancer Cousin        dx 33s   Lung cancer Cousin     ALLERGIES:  has no known allergies.  MEDICATIONS:  Current Outpatient Medications  Medication Sig Dispense Refill   acetaminophen  (TYLENOL ) 325 MG tablet Take 650 mg by mouth  every 6 (six) hours as needed for moderate pain.      amLODipine  (NORVASC ) 5 MG tablet Take 1 tablet by mouth daily.     anastrozole  (ARIMIDEX ) 1 MG tablet TAKE 1 TABLET BY MOUTH DAILY 90 tablet 3   atorvastatin (LIPITOR) 40 MG tablet Take 40 mg by mouth daily.     Calcium Carb-Cholecalciferol (CALCIUM 500 + D PO) Take 2 tablets by mouth daily.     cetirizine (ZYRTEC) 10 MG tablet Take 10 mg by mouth every morning.     cyanocobalamin (VITAMIN B12) 1000 MCG tablet Take 1,000 mcg by mouth daily.     dorzolamide-timolol (COSOPT) 22.3-6.8 MG/ML ophthalmic solution Place 1 drop into the right eye 2 (two) times daily.     gabapentin  (NEURONTIN ) 300 MG capsule Take 1 capsule (300 mg total) by mouth 3 (three) times daily. 90 capsule 5   lisinopril (ZESTRIL) 10 MG tablet Take 10 mg by mouth daily.     omeprazole (PRILOSEC) 20 MG capsule Take 20 mg by mouth daily.     ondansetron  (ZOFRAN ) 4 MG tablet Take 1 tablet (4 mg total) by mouth every 8 (eight) hours as needed for up to 20 doses for nausea or vomiting. 20 tablet 0   Vitamin D , Ergocalciferol , (DRISDOL) 1.25 MG (50000 UNIT) CAPS capsule Take 50,000 Units by mouth once a week.     Semaglutide (OZEMPIC, 0.25 OR 0.5 MG/DOSE, [place]) Inject 0.25 mg into the skin once a week. (Patient not taking: Reported on 01/17/2024)     No current facility-administered medications for this  visit.   Facility-Administered Medications Ordered in Other Visits  Medication Dose Route Frequency Provider Last Rate Last Admin   denosumab  (PROLIA ) injection 60 mg  60 mg Subcutaneous Q6 months Babara Call, MD   60 mg at 01/17/24 1416     PHYSICAL EXAMINATION: ECOG PERFORMANCE STATUS: 1 - Symptomatic but completely ambulatory Vitals:   01/17/24 1336  BP: 136/83  Pulse: 64  Resp: 16  Temp: 98 F (36.7 C)  SpO2: 98%   Filed Weights   01/17/24 1336  Weight: 187 lb (84.8 kg)    Physical Exam Constitutional:      General: She is not in acute distress. HENT:     Head: Normocephalic and atraumatic.  Eyes:     General: No scleral icterus. Cardiovascular:     Rate and Rhythm: Normal rate and regular rhythm.     Heart sounds: Normal heart sounds.  Pulmonary:     Effort: Pulmonary effort is normal. No respiratory distress.     Breath sounds: No wheezing.  Abdominal:     General: Bowel sounds are normal. There is no distension.     Palpations: Abdomen is soft.  Musculoskeletal:        General: No deformity. Normal range of motion.     Cervical back: Normal range of motion and neck supple.  Skin:    General: Skin is warm and dry.     Findings: No erythema or rash.  Neurological:     Mental Status: She is alert and oriented to person, place, and time. Mental status is at baseline.     Cranial Nerves: No cranial nerve deficit.     Coordination: Coordination normal.  Psychiatric:        Mood and Affect: Mood normal.    Breast exam was performed in seated and lying down position. Patient is status post left lumpectomy with a well-healed surgical scar.  History of breast augmentation  with implants. .  There is palpable area of tissue thickening at the site of lumpectomy. No discrete mass palpable in right breast.  No palpable lymphadenopathy bilateral axillary  LABORATORY DATA:  I have reviewed the data as listed    Latest Ref Rng & Units 01/17/2024    1:13 PM 07/18/2023    12:59 PM 01/17/2023   12:47 PM  CBC  WBC 4.0 - 10.5 K/uL 7.4  7.1  7.1   Hemoglobin 12.0 - 15.0 g/dL 87.0  87.0  87.9   Hematocrit 36.0 - 46.0 % 38.9  39.4  36.7   Platelets 150 - 400 K/uL 254  258  269       Latest Ref Rng & Units 01/17/2024    1:14 PM 07/18/2023   12:59 PM 01/17/2023   12:47 PM  CMP  Glucose 70 - 99 mg/dL 885  893  92   BUN 8 - 23 mg/dL 18  22  35   Creatinine 0.44 - 1.00 mg/dL 9.04  9.07  8.87   Sodium 135 - 145 mmol/L 141  141  141   Potassium 3.5 - 5.1 mmol/L 4.0  4.2  4.2   Chloride 98 - 111 mmol/L 106  107  107   CO2 22 - 32 mmol/L 26  26  28    Calcium 8.9 - 10.3 mg/dL 8.8  8.9  9.3   Total Protein 6.5 - 8.1 g/dL 6.4  6.6  6.5   Total Bilirubin 0.0 - 1.2 mg/dL 0.9  0.7  0.6   Alkaline Phos 38 - 126 U/L 68  58  77   AST 15 - 41 U/L 22  23  19    ALT 0 - 44 U/L 12  18  13       RADIOGRAPHIC STUDIES: I have personally reviewed the radiological images as listed and agreed with the findings in the report. MM 3D SCREEN BREAST W/IMPLANT BILATERAL Result Date: 12/16/2023 CLINICAL DATA:  Screening. Bilateral breast reduction and implant revision since the previous screening mammogram. EXAM: DIGITAL SCREENING BILATERAL MAMMOGRAM WITH IMPLANTS, CAD AND TOMOSYNTHESIS TECHNIQUE: Bilateral screening digital craniocaudal and mediolateral oblique mammograms were obtained. Bilateral screening digital breast tomosynthesis was performed. The images were evaluated with computer-aided detection. Standard and/or implant displaced views were performed. COMPARISON:  Previous exam(s). ACR Breast Density Category b: There are scattered areas of fibroglandular density. FINDINGS: The patient has prepectoral implants. There are no findings suspicious for malignancy. IMPRESSION: No mammographic evidence of malignancy. A result letter of this screening mammogram will be mailed directly to the patient. RECOMMENDATION: Screening mammogram in one year. (Code:SM-B-01Y) BI-RADS CATEGORY  1: Negative.  Electronically Signed   By: Alm Parkins M.D.   On: 12/16/2023 07:47

## 2024-01-17 NOTE — Assessment & Plan Note (Signed)
 10/30/2019, DEXA showed osteopenia, 10-year risk of any major fracture 15%, 10-year risk of hip fracture 2.3%.   Continue calcium 1200 mg daily and vitamin D  supplementation. Proceed with Prolia  Repeat DEXA

## 2024-02-08 ENCOUNTER — Encounter: Payer: Self-pay | Admitting: Nurse Practitioner

## 2024-02-08 ENCOUNTER — Ambulatory Visit: Attending: Nurse Practitioner | Admitting: Nurse Practitioner

## 2024-02-08 VITALS — BP 127/56 | HR 63 | Temp 96.8°F | Resp 18 | Ht 63.0 in | Wt 185.0 lb

## 2024-02-08 DIAGNOSIS — Z9889 Other specified postprocedural states: Secondary | ICD-10-CM | POA: Diagnosis present

## 2024-02-08 DIAGNOSIS — M5136 Other intervertebral disc degeneration, lumbar region with discogenic back pain only: Secondary | ICD-10-CM | POA: Diagnosis present

## 2024-02-08 DIAGNOSIS — G894 Chronic pain syndrome: Secondary | ICD-10-CM | POA: Insufficient documentation

## 2024-02-08 DIAGNOSIS — G8929 Other chronic pain: Secondary | ICD-10-CM | POA: Diagnosis present

## 2024-02-08 DIAGNOSIS — M47816 Spondylosis without myelopathy or radiculopathy, lumbar region: Secondary | ICD-10-CM | POA: Insufficient documentation

## 2024-02-08 DIAGNOSIS — M5134 Other intervertebral disc degeneration, thoracic region: Secondary | ICD-10-CM | POA: Diagnosis present

## 2024-02-08 DIAGNOSIS — Z79899 Other long term (current) drug therapy: Secondary | ICD-10-CM | POA: Insufficient documentation

## 2024-02-08 DIAGNOSIS — M546 Pain in thoracic spine: Secondary | ICD-10-CM | POA: Diagnosis present

## 2024-02-08 MED ORDER — GABAPENTIN 300 MG PO CAPS: 300.0000 mg | ORAL_CAPSULE | Freq: Three times a day (TID) | ORAL | 5 refills | Status: AC

## 2024-02-08 MED ORDER — GABAPENTIN 300 MG PO CAPS: 300.0000 mg | ORAL_CAPSULE | Freq: Three times a day (TID) | ORAL | 0 refills | Status: AC

## 2024-02-08 NOTE — Progress Notes (Signed)
 Safety precautions to be maintained throughout the outpatient stay will include: orient to surroundings, keep bed in low position, maintain call bell within reach at all times, provide assistance with transfer out of bed and ambulation.

## 2024-02-08 NOTE — Progress Notes (Signed)
 PROVIDER NOTE: Interpretation of information contained herein should be left to medically-trained personnel. Specific patient instructions are provided elsewhere under Patient Instructions section of medical record. This document was created in part using AI and STT-dictation technology, any transcriptional errors that may result from this process are unintentional.  Patient: Tonya Bowen  Service: E/M   PCP: Glover Lenis, MD  DOB: [birth year]  DOS: 02/08/2024  Provider: Emmy MARLA Blanch, NP  MRN: [id number]  Delivery: Face-to-face  Specialty: Interventional Pain Management  Type: Established Patient  Setting: Ambulatory outpatient facility  Specialty designation: 09  Referring Prov.: Glover Lenis, MD  Location: Outpatient office facility       History of present illness (HPI) Tonya Bowen, a 78 y.o. year old female, is here today because of her S/P kyphoplasty [Z98.890]. Tonya Bowen primary complain today is Back Pain  Pertinent problems: Tonya Bowen has S/P kyphoplasty; Osteopenia; Chronic midline thoracic back pain; Lumbar degenerative disc disease; Lumbar facet arthropathy; Chronic pain syndrome; Malignant neoplasm of upper-outer quadrant of left breast in female, estrogen receptor positive (HCC); Family history of breast cancer; Family history of uterine cancer; and Family history of lung cancer on their pertinent problem list.  Pain Assessment: Severity of Chronic pain is reported as a 3 /10. Location: Back Lower/Denies. Onset: More than a month ago. Quality: Aching. Timing: Intermittent. Modifying factor(s): Resting. Vitals:  height is 5' 3 (1.6 m) and weight is 185 lb (83.9 kg). Her temporal temperature is 96.8 F (36 C) (abnormal). Her blood pressure is 127/56 (abnormal) and her pulse is 63. Her respiration is 18 and oxygen saturation is 99%.  BMI: Estimated body mass index is 32.77 kg/m as calculated from the following:   Height as of this encounter: 5' 3 (1.6 m).   Weight as of  this encounter: 185 lb (83.9 kg).  Last encounter: 08/10/2023. Last procedure: Visit date not found.  Reason for encounter: medication management. No change in medical history since last visit.  Patient's pain is at baseline.  Patient continues multimodal pain regimen as prescribed.  States that it provides pain relief and improvement in functional status.  Patient low up today for management of gabapentin . The patient has other chronic conditions, including hypertension and diabetes, which are reportedly well-managed by her internal medicine provider.   The patient expressed concern about her gabapentin , which she typically receives through mail order delivery.  She reports that she has run out of her current supply today and is worried that the mail-order refill may take 2 to 3 weeks to arrive.  To ensure continued delay of care, I prescribed a 1 month supply of gabapentin  and sent the prescription to [facility] pharmacy until her mail order medication becomes available.  Pharmacotherapy Assessment   Analgesic: Gabapentin  (Neurontin ) 300 mg capsule 3 times daily.  Monitoring: [place] PMP: PDMP reviewed during this encounter.       Pharmacotherapy: No side-effects or adverse reactions reported. Compliance: No problems identified. Effectiveness: Clinically acceptable.  Erlene Doyal SAUNDERS, [place]  02/08/2024 11:16 AM  Sign when Signing Visit Safety precautions to be maintained throughout the outpatient stay will include: orient to surroundings, keep bed in low position, maintain call bell within reach at all times, provide assistance with transfer out of bed and ambulation.     UDS:  No results found for: SUMMARY  No results found for: CBDTHCR No results found for: D8THCCBX No results found for: D9THCCBX  ROS  Constitutional: Denies any fever or chills Gastrointestinal: No reported  hemesis, hematochezia, vomiting, or acute GI distress Musculoskeletal: Low back pain Neurological: No reported  episodes of acute onset apraxia, aphasia, dysarthria, agnosia, amnesia, paralysis, loss of coordination, or loss of consciousness  Medication Review  Calcium Carb-Cholecalciferol, Semaglutide, Vitamin D  (Ergocalciferol ), acetaminophen , amLODipine , anastrozole , atorvastatin, cetirizine, conjugated estrogens, cyanocobalamin, denosumab , dorzolamide-timolol, gabapentin , lisinopril, omeprazole, and ondansetron   History Review  Allergy: Tonya Bowen has no known allergies. Drug: Tonya Bowen  reports no history of drug use. Alcohol:  reports current alcohol use of about 2.0 standard drinks of alcohol per week. Tobacco:  reports that she has never smoked. She has never used smokeless tobacco. Social: Tonya Bowen  reports that she has never smoked. She has never used smokeless tobacco. She reports current alcohol use of about 2.0 standard drinks of alcohol per week. She reports that she does not use drugs. Medical:  has a past medical history of Arthritis, Cancer (HCC) (12/27/2019), Chronic back pain, Complication of anesthesia, Diabetes mellitus without complication (HCC), Family history of breast cancer, Family history of lung cancer, Family history of uterine cancer, GERD (gastroesophageal reflux disease), Hypertension, and Stomach ulcer. Surgical: Tonya Bowen  has a past surgical history that includes Abdominal hysterectomy; Tonsillectomy; Bunionectomy (Bilateral); Breast enhancement surgery; Cataract extraction (Left); Radial keratotomy; colonoscopy; Upper gi endoscopy; Kyphoplasty (N/A, 02/06/2014); Augmentation mammaplasty (Bilateral, 1987); Breast lumpectomy with sentinel lymph node bx (Left, 02/04/2020); Breast lumpectomy (Left, 01/2020); Glaucoma surgery (01/26/2022); Colonoscopy (N/A, 06/14/2022); and Esophagogastroduodenoscopy (N/A, 06/14/2022). Family: family history includes ALS in her daughter; Breast cancer in her cousin, cousin, cousin, paternal aunt, paternal aunt, and paternal aunt; Lung cancer in her  brother and cousin; Uterine cancer in her mother.  Laboratory Chemistry Profile   Renal Lab Results  Component Value Date   BUN 18 01/17/2024   CREATININE 0.95 01/17/2024   GFRAA >90 01/30/2014   GFRNONAA >60 01/17/2024    Hepatic Lab Results  Component Value Date   AST 22 01/17/2024   ALT 12 01/17/2024   ALBUMIN 3.5 01/17/2024   ALKPHOS 68 01/17/2024    Electrolytes Lab Results  Component Value Date   NA 141 01/17/2024   K 4.0 01/17/2024   CL 106 01/17/2024   CALCIUM 8.8 (L) 01/17/2024    Bone Lab Results  Component Value Date   VD25OH 44.49 07/15/2022    Inflammation (CRP: Acute Phase) (ESR: Chronic Phase) Lab Results  Component Value Date   CRP 0.6 12/09/2021   ESRSEDRATE 15 12/09/2021         Note: Above Lab results reviewed.  Recent Imaging Review  MM 3D SCREEN BREAST W/IMPLANT BILATERAL CLINICAL DATA:  Screening. Bilateral breast reduction and implant revision since the previous screening mammogram.  EXAM: DIGITAL SCREENING BILATERAL MAMMOGRAM WITH IMPLANTS, CAD AND TOMOSYNTHESIS  TECHNIQUE: Bilateral screening digital craniocaudal and mediolateral oblique mammograms were obtained. Bilateral screening digital breast tomosynthesis was performed. The images were evaluated with computer-aided detection. Standard and/or implant displaced views were performed.  COMPARISON:  Previous exam(s).  ACR Breast Density Category b: There are scattered areas of fibroglandular density.  FINDINGS: The patient has prepectoral implants. There are no findings suspicious for malignancy.  IMPRESSION: No mammographic evidence of malignancy. A result letter of this screening mammogram will be mailed directly to the patient.  RECOMMENDATION: Screening mammogram in one year. (Code:[id number]-01Y)  BI-RADS CATEGORY  1: Negative.  Electronically Signed   By: Alm Parkins M.D.   On: 12/16/2023 07:47 Note: Reviewed        Physical Exam  Vitals: BP (!) 127/56 (  BP  Location: Right Arm, Patient Position: Sitting, Cuff Size: Normal)   Pulse 63   Temp (!) 96.8 F (36 C) (Temporal)   Resp 18   Ht 5' 3 (1.6 m)   Wt 185 lb (83.9 kg)   SpO2 99%   BMI 32.77 kg/m  BMI: Estimated body mass index is 32.77 kg/m as calculated from the following:   Height as of this encounter: 5' 3 (1.6 m).   Weight as of this encounter: 185 lb (83.9 kg). Ideal: Ideal body weight: 52.4 kg (115 lb 8.3 oz) Adjusted ideal body weight: 65 kg (143 lb 5 oz) General appearance: Well nourished, well developed, and well hydrated. In no apparent acute distress Mental status: Alert, oriented x 3 (person, place, & time)       Respiratory: No evidence of acute respiratory distress Eyes: PERLA  Musculoskeletal: +LBP Assessment   Diagnosis Status  1. S/P kyphoplasty (L2 done in 2015 with Dr Burnetta)   2. Medication management   3. Degeneration of intervertebral disc of lumbar region with discogenic back pain   4. Lumbar facet arthropathy   5. Chronic midline thoracic back pain   6. Chronic pain syndrome   7. Other intervertebral disc degeneration, thoracic region    Controlled Controlled Controlled   Updated Problems: Problem  Medication Management    Plan of Care  Problem-specific:  Assessment and Plan  We will continue on current medication regimen.  The patient did not express any new concerns or symptoms during this consultation and reported no side effects from current medication.   She is on gabapentin  300 Mg total by mouth 3 times daily.   Medication management: Meds ordered this encounter  Medications   gabapentin  (NEURONTIN ) 300 MG capsule    Sig: Take 1 capsule (300 mg total) by mouth 3 (three) times daily.    Dispense:  90 capsule    Refill:  5   gabapentin  (NEURONTIN ) 300 MG capsule    Sig: Take 1 capsule (300 mg total) by mouth every 8 (eight) hours.    Dispense:  90 capsule    Refill:  0    Fill one day early if pharmacy is closed on scheduled  refill date. May substitute for generic if available.     Tonya Bowen has a current medication list which includes the following long-term medication(s): calcium carb-cholecalciferol, cetirizine, gabapentin , lisinopril, and gabapentin .  Pharmacotherapy (Medications Ordered): Meds ordered this encounter  Medications   gabapentin  (NEURONTIN ) 300 MG capsule    Sig: Take 1 capsule (300 mg total) by mouth 3 (three) times daily.    Dispense:  90 capsule    Refill:  5   gabapentin  (NEURONTIN ) 300 MG capsule    Sig: Take 1 capsule (300 mg total) by mouth every 8 (eight) hours.    Dispense:  90 capsule    Refill:  0    Fill one day early if pharmacy is closed on scheduled refill date. May substitute for generic if available.   Orders:  No orders of the defined types were placed in this encounter.       Return in about 6 months (around 08/07/2024) for (F2F), (MM), Emmy Blanch NP.    Recent Visits No visits were found meeting these conditions. Showing recent visits within past 90 days and meeting all other requirements Today's Visits Date Type Provider Dept  02/08/24 Office Visit Vaudie Engebretsen K, NP [facility]  Showing today's visits and meeting all other requirements  Future Appointments No visits were found meeting these conditions. Showing future appointments within next 90 days and meeting all other requirements  I discussed the assessment and treatment plan with the patient. The patient was provided an opportunity to ask questions and all were answered. The patient agreed with the plan and demonstrated an understanding of the instructions.  Patient advised to call back or seek an in-person evaluation if the symptoms or condition worsens.  I personally spent a total of 30 minutes in the care of the patient today including preparing to see the patient, getting/reviewing separately obtained history, performing a medically appropriate exam/evaluation, counseling and  educating, placing orders, referring and communicating with other health care professionals, documenting clinical information in the EHR, independently interpreting results, communicating results, and coordinating care.  Note by: Axcel Horsch K Janila Arrazola, NP (TTS and AI technology used. I apologize for any typographical errors that were not detected and corrected.) Date: 02/08/2024; Time: 11:47 AM

## 2024-02-22 ENCOUNTER — Ambulatory Visit: Admitting: Speech Pathology

## 2024-02-22 DIAGNOSIS — R41841 Cognitive communication deficit: Secondary | ICD-10-CM

## 2024-02-22 NOTE — Therapy (Signed)
 OUTPATIENT SPEECH LANGUAGE PATHOLOGY  COGNITION EVALUATION   Patient Name: Tonya Bowen MRN: [id number] DOB:Nov 25, 1945, 78 y.o., female Today's Date: 02/22/2024  PCP: Alm Na, MD REFERRING PROVIDER: Jannett Fairly, MD   End of Session - 02/22/24 1722     Visit Number 1    Number of Visits 17    Date for Recertification  04/18/24    Authorization Type [employer]    Progress Note Due on Visit 10    SLP Start Time 1400    SLP Stop Time  1445    SLP Time Calculation (min) 45 min    Activity Tolerance Patient tolerated treatment well          Past Medical History:  Diagnosis Date   Arthritis    Cancer (HCC) 12/27/2019   left breast   Chronic back pain    Complication of anesthesia    pt states she felt every thing during breast augmentation surgery   Diabetes mellitus without complication (HCC)    Family history of breast cancer    Family history of lung cancer    Family history of uterine cancer    GERD (gastroesophageal reflux disease)    Hypertension    Stomach ulcer    Past Surgical History:  Procedure Laterality Date   ABDOMINAL HYSTERECTOMY     AUGMENTATION MAMMAPLASTY Bilateral 1987   BREAST ENHANCEMENT SURGERY     BREAST LUMPECTOMY Left 01/2020   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   BREAST LUMPECTOMY WITH SENTINEL LYMPH NODE BIOPSY Left 02/04/2020   Procedure: BREAST LUMPECTOMY WITH SENTINEL LYMPH NODE BX;  Surgeon: Dessa Reyes ORN, MD;  Location: [facility] ORS;  Service: General;  Laterality: Left;   BUNIONECTOMY Bilateral    Great toes   CATARACT EXTRACTION Left    colonoscopy     COLONOSCOPY N/A 06/14/2022   Procedure: COLONOSCOPY;  Surgeon: Onita Elspeth Sharper, DO;  Location: [facility] ENDOSCOPY;  Service: Gastroenterology;  Laterality: N/A;   ESOPHAGOGASTRODUODENOSCOPY N/A 06/14/2022   Procedure: ESOPHAGOGASTRODUODENOSCOPY (EGD);  Surgeon: Onita Elspeth Sharper, DO;  Location: [facility] ENDOSCOPY;  Service:  Gastroenterology;  Laterality: N/A;   GLAUCOMA SURGERY  01/26/2022   KYPHOPLASTY N/A 02/06/2014   Procedure:  L2 KYPHOPLASTY;  Surgeon: Donaciano Sprang, MD;  Location: [facility] OR;  Service: Orthopedics;  Laterality: N/A;   RADIAL KERATOTOMY     TONSILLECTOMY     UPPER GI ENDOSCOPY     Patient Active Problem List   Diagnosis Date Noted   Medication management 02/08/2024   Ruptured silicone breast implant 01/28/2023   Hypocalcemia 06/17/2022   UTI (urinary tract infection) 06/17/2022   Aromatase inhibitor use 06/16/2021   Genetic testing 01/22/2020   Family history of uterine cancer    Family history of lung cancer    Malignant neoplasm of upper-outer quadrant of left breast in female, estrogen receptor positive (HCC) 01/03/2020   Goals of care, counseling/discussion 01/03/2020   Family history of breast cancer 01/03/2020   Chronic midline thoracic back pain 01/02/2020   Lumbar degenerative disc disease 01/02/2020   Lumbar facet arthropathy 01/02/2020   Chronic pain syndrome 01/02/2020   Hyperglycemia 12/09/2017   Osteopenia 10/04/2014   S/P kyphoplasty 02/06/2014   Benign essential hypertension 09/03/2013    ONSET DATE: date of referral  02/14/2024  REFERRING DIAG: R41.89 (ICD-10-CM) - Other symptoms and signs involving cognitive functions and awareness   THERAPY DIAG:  Cognitive communication deficit  Rationale for Evaluation and Treatment Rehabilitation  SUBJECTIVE:   SUBJECTIVE STATEMENT:  Pt pleasant, requests some repetition of verbal information, hearing loss Pt accompanied by: self  PERTINENT HISTORY and DIAGNOSTIC FINDINGS: Pt is a 78 year old female with mild cognitive impairment, unqualified visual loss (right eye), imbalance with bilateral lower extremeit paresthesias, chronic kidney disease, history of breast cancer.  02/13/2024  SLUMS- 21/30  09/02/2023 [facility] Audiometric Results: Audiometric Results:  RIGHT Ear: Mild to moderately-severe sensorineural hearing  loss Pure Tone Average (PTA): 53 dB HL (Moderate hearing loss range (41-55 dB HL)) Speech Reception Threshold (SRT): 50 dB HL Word Recognition Score (using Recorded NU-6 words, Ordered by Difficulty): 42% at 90 dB HL with effective masking in the contralateral ear   LEFT Ear: Moderate to moderately-severe sensorineural hearing loss Pure Tone Average (PTA): 55 dB HL (Moderate hearing loss range (41-55 dB HL)) Speech Reception Threshold (SRT): 55 dB HL Word Recognition Score (using Recorded NU-6 words, Ordered by Difficulty): 46% at 95 dB HL with effective masking in the contralateral ear   Pt began wearing bilateral hearing aids on 12/27/2023.   07/21/2020 MRI Mild global parenchymal volume loss. There are scattered T2 FLAIR white matter hyperintensities bilaterally, nonspecific but consistent with chronic microvascular ischemia.  PAIN:  Are you having pain? No   FALLS: Has patient fallen in last 6 months?  No  LIVING ENVIRONMENT: Lives with: lives alone Lives in: House/apartment  PLOF:  Level of assistance: Independent with ADLs, Independent with IADLs Employment: Retired   PATIENT GOALS   to improve cognitive communication abilities  OBJECTIVE:   COGNITIVE COMMUNICATION Overall cognitive status: No family/caregiver present to determine baseline cognitive functioning Areas of impairment:  Memory: Impaired: Working Interior And Spatial Designer function: Impaired: Comment: will benefit from further assessment Functional Impairments: pt with visuoperceptional difficulties, misplaces items/objects, intermittent missed appointments.   AUDITORY COMPREHENSION  Overall auditory comprehension: Appears intact YES/NO questions: Appears intact Following directions: Appears intact Conversation: Complex Interfering components: hearing Effective technique: repetition/stressing words and stressing words  READING COMPREHENSION: Intact  EXPRESSION: verbal  VERBAL  EXPRESSION:   Overall verbal expression: Appears intact Level of generative/spontaneous verbalization: conversation Automatic speech: name: intact and social response: intact  Repetition: Appears intact Pragmatics: Appears intact Non-verbal means of communication: N/A  ORAL MOTOR EXAMINATION Facial : WFL Lingual: WFL Velum: WFL Mandible: WFL Cough: WFL Voice: WFL  MOTOR SPEECH: Overall motor speech: Appears intact Phonation: normal Resonance: WFL Articulation: Appears intact Intelligibility: Intelligible Motor planning: Appears intact   STANDARDIZED ASSESSMENTS: To be completed in next session, will need to accommodate vision loss (right eye)  PATIENT REPORTED OUTCOME MEASURES (PROM): To be completed over the next 3 sessions   TODAY'S TREATMENT:  Skilled education provided on hearing aid use, hearing loss and cognitive communication abilities.    PATIENT EDUCATION: Education details: results of this assessment, ST POC Person educated: Patient Education method: Explanation Education comprehension: needs further education   HOME EXERCISE PROGRAM:   Begin wearing bilateral hearing aids   GOALS:  Goals reviewed with patient? Yes  SHORT TERM GOALS: Target date: 10 sessions   Pt. will recall 3/4 memory strategies (w-write it, r-repeat, a-associate it, p-picture) and utilize strategies in structured and functional memory tasks to recall 80% of presented information given min cues.  Baseline: Goal status: INITIAL  2.  To identify emerging areas of strength/need, patient will participate in ongoing diagnostic treatment (Mini Addenbrooke's Cognitive Examination) Baseline:  Goal status: INITIAL  3.  Pt will report improved cognitive communication via PROM by 5 points at last ST session  Baseline:  Goal status: INITIAL   LONG TERM GOALS: Target date: 04/18/2024  Patient will demonstrate functional use of external memory and environmental supports to maintain  participation in daily routines  Baseline:  Goal status: INITIAL  2.  Patient will improve cognitive-linguistic functioning to support safety and independence in daily communication tasks. Baseline:  Goal status: INITIAL  3.  With Mod I, patient will use strategies to improve memory for important information  (ie., white board, daily planner/calendar, Apps on phone).   Baseline:  Goal status: INITIAL   ASSESSMENT:  CLINICAL IMPRESSION: Patient is a 78 y.o. female who was seen today for a cognitive communication evaluation secondary to dx of mild cognitive impairment.    OBJECTIVE IMPAIRMENTS include memory and executive functioning. These impairments are limiting patient from managing medications, managing appointments, managing finances, household responsibilities, and effectively communicating at home and in community. Factors affecting potential to achieve goals and functional outcome are ability to learn/carryover information, co-morbidities, and medical prognosis. Patient will benefit from skilled SLP services to address above impairments and improve overall function.  REHAB POTENTIAL: Good  PLAN: SLP FREQUENCY: 1-2x/week  SLP DURATION: 8 weeks  PLANNED INTERVENTIONS: Internal/external aids, Functional tasks, SLP instruction and feedback, Compensatory strategies, and Patient/family education   Yina Riviere B. Rubbie, M.S., CCC-SLP, Tree Surgeon Certified Brain Injury Specialist [facility]  [facility] Office [phone] Ascom [phone] Fax [phone]

## 2024-02-29 ENCOUNTER — Ambulatory Visit: Admitting: Speech Pathology

## 2024-02-29 DIAGNOSIS — R41841 Cognitive communication deficit: Secondary | ICD-10-CM | POA: Diagnosis not present

## 2024-02-29 NOTE — Therapy (Signed)
 OUTPATIENT SPEECH LANGUAGE PATHOLOGY  COGNITION TREATMENT NOTE   Patient Name: Tonya Bowen MRN: [id number] DOB:1945/07/01, 78 y.o., female Today's Date: 02/29/2024  PCP: Alm Na, MD REFERRING PROVIDER: Jannett Fairly, MD   End of Session - 02/29/24 1523     Visit Number 2    Number of Visits 17    Date for Recertification  04/18/24    Authorization Type [employer]    Authorization Time Period 02/22/2024 thru 04/18/2024    Authorization - Visit Number 2    Authorization - Number of Visits 16    Progress Note Due on Visit 10    SLP Start Time 1445    SLP Stop Time  1525    SLP Time Calculation (min) 40 min    Activity Tolerance Patient tolerated treatment well          Past Medical History:  Diagnosis Date   Arthritis    Cancer (HCC) 12/27/2019   left breast   Chronic back pain    Complication of anesthesia    pt states she felt every thing during breast augmentation surgery   Diabetes mellitus without complication (HCC)    Family history of breast cancer    Family history of lung cancer    Family history of uterine cancer    GERD (gastroesophageal reflux disease)    Hypertension    Stomach ulcer    Past Surgical History:  Procedure Laterality Date   ABDOMINAL HYSTERECTOMY     AUGMENTATION MAMMAPLASTY Bilateral 1987   BREAST ENHANCEMENT SURGERY     BREAST LUMPECTOMY Left 01/2020   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   BREAST LUMPECTOMY WITH SENTINEL LYMPH NODE BIOPSY Left 02/04/2020   Procedure: BREAST LUMPECTOMY WITH SENTINEL LYMPH NODE BX;  Surgeon: Dessa Reyes ORN, MD;  Location: [facility] ORS;  Service: General;  Laterality: Left;   BUNIONECTOMY Bilateral    Great toes   CATARACT EXTRACTION Left    colonoscopy     COLONOSCOPY N/A 06/14/2022   Procedure: COLONOSCOPY;  Surgeon: Onita Elspeth Sharper, DO;  Location: [facility] ENDOSCOPY;  Service: Gastroenterology;  Laterality: N/A;   ESOPHAGOGASTRODUODENOSCOPY N/A  06/14/2022   Procedure: ESOPHAGOGASTRODUODENOSCOPY (EGD);  Surgeon: Onita Elspeth Sharper, DO;  Location: [facility] ENDOSCOPY;  Service: Gastroenterology;  Laterality: N/A;   GLAUCOMA SURGERY  01/26/2022   KYPHOPLASTY N/A 02/06/2014   Procedure:  L2 KYPHOPLASTY;  Surgeon: Donaciano Sprang, MD;  Location: [facility] OR;  Service: Orthopedics;  Laterality: N/A;   RADIAL KERATOTOMY     TONSILLECTOMY     UPPER GI ENDOSCOPY     Patient Active Problem List   Diagnosis Date Noted   Medication management 02/08/2024   Ruptured silicone breast implant 01/28/2023   Hypocalcemia 06/17/2022   UTI (urinary tract infection) 06/17/2022   Aromatase inhibitor use 06/16/2021   Genetic testing 01/22/2020   Family history of uterine cancer    Family history of lung cancer    Malignant neoplasm of upper-outer quadrant of left breast in female, estrogen receptor positive (HCC) 01/03/2020   Goals of care, counseling/discussion 01/03/2020   Family history of breast cancer 01/03/2020   Chronic midline thoracic back pain 01/02/2020   Lumbar degenerative disc disease 01/02/2020   Lumbar facet arthropathy 01/02/2020   Chronic pain syndrome 01/02/2020   Hyperglycemia 12/09/2017   Osteopenia 10/04/2014   S/P kyphoplasty 02/06/2014   Benign essential hypertension 09/03/2013    ONSET DATE: date of referral  02/14/2024  REFERRING DIAG: R41.89 (ICD-10-CM) - Other symptoms and  signs involving cognitive functions and awareness   THERAPY DIAG:  Cognitive communication deficit  Rationale for Evaluation and Treatment Rehabilitation  SUBJECTIVE:   PERTINENT HISTORY and DIAGNOSTIC FINDINGS: Pt is a 78 year old female with mild cognitive impairment, unqualified visual loss (right eye), imbalance with bilateral lower extremeit paresthesias, chronic kidney disease, history of breast cancer.  02/13/2024  SLUMS- 21/30  09/02/2023 [facility] Audiometric Results: Audiometric Results:  RIGHT Ear: Mild to moderately-severe sensorineural  hearing loss Pure Tone Average (PTA): 53 dB HL (Moderate hearing loss range (41-55 dB HL)) Speech Reception Threshold (SRT): 50 dB HL Word Recognition Score (using Recorded NU-6 words, Ordered by Difficulty): 42% at 90 dB HL with effective masking in the contralateral ear   LEFT Ear: Moderate to moderately-severe sensorineural hearing loss Pure Tone Average (PTA): 55 dB HL (Moderate hearing loss range (41-55 dB HL)) Speech Reception Threshold (SRT): 55 dB HL Word Recognition Score (using Recorded NU-6 words, Ordered by Difficulty): 46% at 95 dB HL with effective masking in the contralateral ear   Pt began wearing bilateral hearing aids on 12/27/2023.   07/21/2020 MRI Mild global parenchymal volume loss. There are scattered T2 FLAIR white matter hyperintensities bilaterally, nonspecific but consistent with chronic microvascular ischemia.  PAIN:  Are you having pain? No   FALLS: Has patient fallen in last 6 months?  No  LIVING ENVIRONMENT: Lives with: lives alone Lives in: House/apartment  PLOF:  Level of assistance: Independent with ADLs, Independent with IADLs Employment: Retired   PATIENT GOALS   to improve cognitive communication abilities  SUBJECTIVE STATEMENT: Pt pleasant, reports increased use of hearing aids Pt accompanied by: self  OBJECTIVE:   TODAY'S TREATMENT:  Skilled treatment session focused on pt's cognitive communication goals. SLP facilitated session by providing the following interventions:  Pt reports increased wear time of hearing aids but struggles with loudness of her dogs barking. She continues to report good social involvement/activities.   To further formally assess pt's cognitive abilities, the ACE III was administered.   STANDARDIZED ASSESSMENTS: Addenbrooke's Cognitive Examination - ACE III The Addenbrooke's Cognitive Examination-III (ACE-III) is a brief cognitive test that assesses five cognitive domains. The total score is 100 with higher  scores indicating better cognitive functioning. Cut off scores of 88 and 82 are recommended for suspicion of dementia (88 has sensitivity of 1.00 and specificity of 0.96, 82 has sensitivity of 0.93 and specificity of 1.00). American Version A  Attention 18/18  Memory 22/26  Fluency 10/14  Language 26/26  Visuospatial 16/16  TOTAL ACE- III Score 92/100   Education provided on the results as well as current ST POC.  PATIENT REPORTED OUTCOME MEASURES (PROM): To be completed over the next 3 sessions     PATIENT EDUCATION: Education details: results of this assessment, ST POC Person educated: Patient Education method: Explanation Education comprehension: needs further education   HOME EXERCISE PROGRAM:   Begin wearing bilateral hearing aids   GOALS:  Goals reviewed with patient? Yes  SHORT TERM GOALS: Target date: 10 sessions   Pt. will recall 3/4 memory strategies (w-write it, r-repeat, a-associate it, p-picture) and utilize strategies in structured and functional memory tasks to recall 80% of presented information given min cues.  Baseline: Goal status: INITIAL  2.  To identify emerging areas of strength/need, patient will participate in ongoing diagnostic treatment (Mini Addenbrooke's Cognitive Examination) Baseline:  Goal status: INITIAL  3.  Pt will report improved cognitive communication via PROM by 5 points at last ST session  Baseline:  Goal status: INITIAL   LONG TERM GOALS: Target date: 04/18/2024  Patient will demonstrate functional use of external memory and environmental supports to maintain participation in daily routines  Baseline:  Goal status: INITIAL  2.  Patient will improve cognitive-linguistic functioning to support safety and independence in daily communication tasks. Baseline:  Goal status: INITIAL  3.  With Mod I, patient will use strategies to improve memory for important information  (ie., white board, daily planner/calendar, Apps on  phone).   Baseline:  Goal status: INITIAL   ASSESSMENT:  CLINICAL IMPRESSION: Patient is a 78 y.o. female who was seen today for a cognitive communication treatment secondary to dx of mild cognitive impairment.    OBJECTIVE IMPAIRMENTS include memory and executive functioning. These impairments are limiting patient from managing medications, managing appointments, managing finances, household responsibilities, and effectively communicating at home and in community. Factors affecting potential to achieve goals and functional outcome are ability to learn/carryover information, co-morbidities, and medical prognosis. Patient will benefit from skilled SLP services to address above impairments and improve overall function.  REHAB POTENTIAL: Good  PLAN: SLP FREQUENCY: 1-2x/week  SLP DURATION: 8 weeks  PLANNED INTERVENTIONS: Internal/external aids, Functional tasks, SLP instruction and feedback, Compensatory strategies, and Patient/family education   Krystie Leiter B. Rubbie, M.S., CCC-SLP, Tree Surgeon Certified Brain Injury Specialist [facility]  [facility] Rehabilitation Services Office [phone] Ascom [phone] Fax [phone]

## 2024-03-05 ENCOUNTER — Ambulatory Visit: Admitting: Speech Pathology

## 2024-03-05 DIAGNOSIS — R41841 Cognitive communication deficit: Secondary | ICD-10-CM

## 2024-03-05 NOTE — Therapy (Signed)
 " OUTPATIENT SPEECH LANGUAGE PATHOLOGY  COGNITION TREATMENT NOTE   Patient Name: Tonya Bowen MRN: [id number] DOB:[birth year], 78 y.o., female Today's Date: 03/05/2024  PCP: Alm Na, MD REFERRING PROVIDER: Jannett Fairly, MD   End of Session - 03/05/24 1149     Visit Number 3    Number of Visits 17    Date for Recertification  04/18/24    Authorization Type [employer]    Authorization Time Period 02/22/2024 thru 04/18/2024    Authorization - Visit Number 3    Authorization - Number of Visits 16    Progress Note Due on Visit 10    SLP Start Time 1145    SLP Stop Time  1215    SLP Time Calculation (min) 30 min    Activity Tolerance Patient tolerated treatment well          Past Medical History:  Diagnosis Date   Arthritis    Cancer (HCC) 12/27/2019   left breast   Chronic back pain    Complication of anesthesia    pt states she felt every thing during breast augmentation surgery   Diabetes mellitus without complication (HCC)    Family history of breast cancer    Family history of lung cancer    Family history of uterine cancer    GERD (gastroesophageal reflux disease)    Hypertension    Stomach ulcer    Past Surgical History:  Procedure Laterality Date   ABDOMINAL HYSTERECTOMY     AUGMENTATION MAMMAPLASTY Bilateral 1987   BREAST ENHANCEMENT SURGERY     BREAST LUMPECTOMY Left 01/2020   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   BREAST LUMPECTOMY WITH SENTINEL LYMPH NODE BIOPSY Left 02/04/2020   Procedure: BREAST LUMPECTOMY WITH SENTINEL LYMPH NODE BX;  Surgeon: Dessa Reyes ORN, MD;  Location: [facility] ORS;  Service: General;  Laterality: Left;   BUNIONECTOMY Bilateral    Great toes   CATARACT EXTRACTION Left    colonoscopy     COLONOSCOPY N/A 06/14/2022   Procedure: COLONOSCOPY;  Surgeon: Onita Elspeth Sharper, DO;  Location: [facility] ENDOSCOPY;  Service: Gastroenterology;  Laterality: N/A;   ESOPHAGOGASTRODUODENOSCOPY N/A  06/14/2022   Procedure: ESOPHAGOGASTRODUODENOSCOPY (EGD);  Surgeon: Onita Elspeth Sharper, DO;  Location: [facility] ENDOSCOPY;  Service: Gastroenterology;  Laterality: N/A;   GLAUCOMA SURGERY  01/26/2022   KYPHOPLASTY N/A 02/06/2014   Procedure:  L2 KYPHOPLASTY;  Surgeon: Donaciano Sprang, MD;  Location: [facility] OR;  Service: Orthopedics;  Laterality: N/A;   RADIAL KERATOTOMY     TONSILLECTOMY     UPPER GI ENDOSCOPY     Patient Active Problem List   Diagnosis Date Noted   Medication management 02/08/2024   Ruptured silicone breast implant 01/28/2023   Hypocalcemia 06/17/2022   UTI (urinary tract infection) 06/17/2022   Aromatase inhibitor use 06/16/2021   Genetic testing 01/22/2020   Family history of uterine cancer    Family history of lung cancer    Malignant neoplasm of upper-outer quadrant of left breast in female, estrogen receptor positive (HCC) 01/03/2020   Goals of care, counseling/discussion 01/03/2020   Family history of breast cancer 01/03/2020   Chronic midline thoracic back pain 01/02/2020   Lumbar degenerative disc disease 01/02/2020   Lumbar facet arthropathy 01/02/2020   Chronic pain syndrome 01/02/2020   Hyperglycemia 12/09/2017   Osteopenia 10/04/2014   S/P kyphoplasty 02/06/2014   Benign essential hypertension 09/03/2013    ONSET DATE: date of referral  02/14/2024  REFERRING DIAG: R41.89 (ICD-10-CM) - Other symptoms  and signs involving cognitive functions and awareness   THERAPY DIAG:  Cognitive communication deficit  Rationale for Evaluation and Treatment Rehabilitation  SUBJECTIVE:   PERTINENT HISTORY and DIAGNOSTIC FINDINGS: Pt is a 78 year old female with mild cognitive impairment, unqualified visual loss (right eye), imbalance with bilateral lower extremeit paresthesias, chronic kidney disease, history of breast cancer.  02/13/2024  SLUMS- 21/30  09/02/2023 [facility] Audiometric Results: Audiometric Results:  RIGHT Ear: Mild to moderately-severe sensorineural  hearing loss Pure Tone Average (PTA): 53 dB HL (Moderate hearing loss range (41-55 dB HL)) Speech Reception Threshold (SRT): 50 dB HL Word Recognition Score (using Recorded NU-6 words, Ordered by Difficulty): 42% at 90 dB HL with effective masking in the contralateral ear   LEFT Ear: Moderate to moderately-severe sensorineural hearing loss Pure Tone Average (PTA): 55 dB HL (Moderate hearing loss range (41-55 dB HL)) Speech Reception Threshold (SRT): 55 dB HL Word Recognition Score (using Recorded NU-6 words, Ordered by Difficulty): 46% at 95 dB HL with effective masking in the contralateral ear   Pt began wearing bilateral hearing aids on 12/27/2023.   07/21/2020 MRI Mild global parenchymal volume loss. There are scattered T2 FLAIR white matter hyperintensities bilaterally, nonspecific but consistent with chronic microvascular ischemia.  PAIN:  Are you having pain? No   FALLS: Has patient fallen in last 6 months?  No  LIVING ENVIRONMENT: Lives with: lives alone Lives in: House/apartment  PLOF:  Level of assistance: Independent with ADLs, Independent with IADLs Employment: Retired   PATIENT GOALS   to improve cognitive communication abilities  SUBJECTIVE STATEMENT: Pt pleasant, reports increased use of hearing aids Pt accompanied by: self  OBJECTIVE:   TODAY'S TREATMENT:  Skilled treatment session focused on pt's cognitive communication goals. SLP facilitated session by providing the following interventions:  Pt reports improved wear times of hearing aids - education provided on brain filtering out excessive noise with increased wear time   Skilled verbal and written information provided on memory strategies (internal and external) with pt able to identify immediate ways to implement multiple strategies.    Education provided on reducing physical clutter (especially on the floor) to reduce fall risk.    List of activities also provided to promote cognitive stimulation.    Education and packet provided on advanced directives   PATIENT EDUCATION: Education details: see above Person educated: Patient Education method: Explanation Education comprehension: needs further education   HOME EXERCISE PROGRAM:   Begin wearing bilateral hearing aids  Reduce clutter  Fill out advanced directives   GOALS:  Goals reviewed with patient? Yes  SHORT TERM GOALS: Target date: 10 sessions   Pt. will recall 3/4 memory strategies (w-write it, r-repeat, a-associate it, p-picture) and utilize strategies in structured and functional memory tasks to recall 80% of presented information given min cues.  Baseline: Goal status: INITIAL  2.  To identify emerging areas of strength/need, patient will participate in ongoing diagnostic treatment (Mini Addenbrooke's Cognitive Examination) Baseline:  Goal status: INITIAL  3.  Pt will report improved cognitive communication via PROM by 5 points at last ST session   Baseline:  Goal status: INITIAL   LONG TERM GOALS: Target date: 04/18/2024  Patient will demonstrate functional use of external memory and environmental supports to maintain participation in daily routines  Baseline:  Goal status: INITIAL  2.  Patient will improve cognitive-linguistic functioning to support safety and independence in daily communication tasks. Baseline:  Goal status: INITIAL  3.  With Mod I, patient will use strategies  to improve memory for important information  (ie., white board, daily planner/calendar, Apps on phone).   Baseline:  Goal status: INITIAL   ASSESSMENT:  CLINICAL IMPRESSION: Patient is a 78 y.o. female who was seen today for a cognitive communication treatment secondary to dx of mild cognitive impairment.    OBJECTIVE IMPAIRMENTS include memory and executive functioning. These impairments are limiting patient from managing medications, managing appointments, managing finances, household responsibilities, and effectively  communicating at home and in community. Factors affecting potential to achieve goals and functional outcome are ability to learn/carryover information, co-morbidities, and medical prognosis. Patient will benefit from skilled SLP services to address above impairments and improve overall function.  REHAB POTENTIAL: Good  PLAN: SLP FREQUENCY: 1-2x/week  SLP DURATION: 8 weeks  PLANNED INTERVENTIONS: Internal/external aids, Functional tasks, SLP instruction and feedback, Compensatory strategies, and Patient/family education   Allyah Heather B. Rubbie, M.S., CCC-SLP, CBIS Speech-Language Pathologist Certified Brain Injury Specialist [facility]  [facility] [phone] Ascom [phone] Fax [phone]   "

## 2024-03-12 ENCOUNTER — Ambulatory Visit: Admitting: Speech Pathology

## 2024-03-12 DIAGNOSIS — R41841 Cognitive communication deficit: Secondary | ICD-10-CM | POA: Diagnosis not present

## 2024-03-12 NOTE — Therapy (Signed)
 " OUTPATIENT SPEECH LANGUAGE PATHOLOGY  COGNITION TREATMENT NOTE DISCHARGE SUMMARY   Patient Name: Tonya Bowen MRN: [id number] DOB:Sep 20, 1945, 78 y.o., female Today's Date: 03/12/2024  PCP: Alm Na, MD REFERRING PROVIDER: Jannett Fairly, MD   End of Session - 03/12/24 1148     Visit Number 4    Number of Visits 17    Date for Recertification  04/18/24    Authorization Type [employer]    Authorization Time Period 02/22/2024 thru 04/18/2024    Authorization - Visit Number 4    Authorization - Number of Visits 16    Progress Note Due on Visit 10    SLP Start Time 1145    SLP Stop Time  1215    SLP Time Calculation (min) 30 min    Activity Tolerance Patient tolerated treatment well          Past Medical History:  Diagnosis Date   Arthritis    Cancer (HCC) 12/27/2019   left breast   Chronic back pain    Complication of anesthesia    pt states she felt every thing during breast augmentation surgery   Diabetes mellitus without complication (HCC)    Family history of breast cancer    Family history of lung cancer    Family history of uterine cancer    GERD (gastroesophageal reflux disease)    Hypertension    Stomach ulcer    Past Surgical History:  Procedure Laterality Date   ABDOMINAL HYSTERECTOMY     AUGMENTATION MAMMAPLASTY Bilateral 1987   BREAST ENHANCEMENT SURGERY     BREAST LUMPECTOMY Left 01/2020   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   BREAST LUMPECTOMY WITH SENTINEL LYMPH NODE BIOPSY Left 02/04/2020   Procedure: BREAST LUMPECTOMY WITH SENTINEL LYMPH NODE BX;  Surgeon: Dessa Reyes ORN, MD;  Location: [facility] ORS;  Service: General;  Laterality: Left;   BUNIONECTOMY Bilateral    Great toes   CATARACT EXTRACTION Left    colonoscopy     COLONOSCOPY N/A 06/14/2022   Procedure: COLONOSCOPY;  Surgeon: Onita Elspeth Sharper, DO;  Location: [facility] ENDOSCOPY;  Service: Gastroenterology;  Laterality: N/A;    ESOPHAGOGASTRODUODENOSCOPY N/A 06/14/2022   Procedure: ESOPHAGOGASTRODUODENOSCOPY (EGD);  Surgeon: Onita Elspeth Sharper, DO;  Location: [facility] ENDOSCOPY;  Service: Gastroenterology;  Laterality: N/A;   GLAUCOMA SURGERY  01/26/2022   KYPHOPLASTY N/A 02/06/2014   Procedure:  L2 KYPHOPLASTY;  Surgeon: Donaciano Sprang, MD;  Location: [facility] OR;  Service: Orthopedics;  Laterality: N/A;   RADIAL KERATOTOMY     TONSILLECTOMY     UPPER GI ENDOSCOPY     Patient Active Problem List   Diagnosis Date Noted   Medication management 02/08/2024   Ruptured silicone breast implant 01/28/2023   Hypocalcemia 06/17/2022   UTI (urinary tract infection) 06/17/2022   Aromatase inhibitor use 06/16/2021   Genetic testing 01/22/2020   Family history of uterine cancer    Family history of lung cancer    Malignant neoplasm of upper-outer quadrant of left breast in female, estrogen receptor positive (HCC) 01/03/2020   Goals of care, counseling/discussion 01/03/2020   Family history of breast cancer 01/03/2020   Chronic midline thoracic back pain 01/02/2020   Lumbar degenerative disc disease 01/02/2020   Lumbar facet arthropathy 01/02/2020   Chronic pain syndrome 01/02/2020   Hyperglycemia 12/09/2017   Osteopenia 10/04/2014   S/P kyphoplasty 02/06/2014   Benign essential hypertension 09/03/2013    ONSET DATE: date of referral  02/14/2024  REFERRING DIAG: R41.89 (ICD-10-CM) -  Other symptoms and signs involving cognitive functions and awareness   THERAPY DIAG:  Cognitive communication deficit  Rationale for Evaluation and Treatment Rehabilitation  SUBJECTIVE:   PERTINENT HISTORY and DIAGNOSTIC FINDINGS: Pt is a 78 year old female with mild cognitive impairment, unqualified visual loss (right eye), imbalance with bilateral lower extremeit paresthesias, chronic kidney disease, history of breast cancer.  02/13/2024  SLUMS- 21/30  09/02/2023 [facility] Audiometric Results: Audiometric Results:  RIGHT Ear: Mild to  moderately-severe sensorineural hearing loss Pure Tone Average (PTA): 53 dB HL (Moderate hearing loss range (41-55 dB HL)) Speech Reception Threshold (SRT): 50 dB HL Word Recognition Score (using Recorded NU-6 words, Ordered by Difficulty): 42% at 90 dB HL with effective masking in the contralateral ear   LEFT Ear: Moderate to moderately-severe sensorineural hearing loss Pure Tone Average (PTA): 55 dB HL (Moderate hearing loss range (41-55 dB HL)) Speech Reception Threshold (SRT): 55 dB HL Word Recognition Score (using Recorded NU-6 words, Ordered by Difficulty): 46% at 95 dB HL with effective masking in the contralateral ear   Pt began wearing bilateral hearing aids on 12/27/2023.   07/21/2020 MRI Mild global parenchymal volume loss. There are scattered T2 FLAIR white matter hyperintensities bilaterally, nonspecific but consistent with chronic microvascular ischemia.  PAIN:  Are you having pain? No   FALLS: Has patient fallen in last 6 months?  No  LIVING ENVIRONMENT: Lives with: lives alone Lives in: House/apartment  PLOF:  Level of assistance: Independent with ADLs, Independent with IADLs Employment: Retired   PATIENT GOALS   to improve cognitive communication abilities  SUBJECTIVE STATEMENT: Pt pleasant, reports increased use of hearing aids Pt accompanied by: self  OBJECTIVE:   TODAY'S TREATMENT:  Skilled treatment session focused on pt's cognitive communication goals. SLP facilitated session by providing the following interventions:  Pt reports improved wear times of hearing aids - education provided on brain filtering out excessive noise with increased wear time   Pt with no further questions regarding cognitive communication abilities, functional activities and strategies to promote continued ability. Skilled written information provided on recommendation for hearing aid use (per her son's request).    PATIENT EDUCATION: Education details: see above Person  educated: Patient Education method: Explanation Education comprehension: needs further education   HOME EXERCISE PROGRAM:   Begin wearing bilateral hearing aids  Reduce clutter  Fill out advanced directives   GOALS:  Goals reviewed with patient? Yes  SHORT TERM GOALS: Target date: 10 sessions  Updated: 03/12/2024  Pt. will recall 3/4 memory strategies (w-write it, r-repeat, a-associate it, p-picture) and utilize strategies in structured and functional memory tasks to recall 80% of presented information given min cues.  Baseline: Goal status: INITIAL: MET  2.  To identify emerging areas of strength/need, patient will participate in ongoing diagnostic treatment (Mini Addenbrooke's Cognitive Examination) Baseline:  Goal status: INITIAL: MET  3.  Pt will report improved cognitive communication via PROM by 5 points at last ST session   Baseline:  Goal status: INITIAL: MET   LONG TERM GOALS: Target date: 04/18/2024  Updated: 03/12/2024 Patient will demonstrate functional use of external memory and environmental supports to maintain participation in daily routines  Baseline:  Goal status: INITIAL: MET  2.  Patient will improve cognitive-linguistic functioning to support safety and independence in daily communication tasks. Baseline:  Goal status: INITIAL: MET  3.  With Mod I, patient will use strategies to improve memory for important information  (ie., white board, daily planner/calendar, Apps on phone).   Baseline:  Goal status: INITIAL: MET   ASSESSMENT:  CLINICAL IMPRESSION: Patient is a 78 y.o. female who was seen today for a cognitive communication treatment secondary to dx of mild cognitive impairment.    At this time, all education as been completed and all pt questions have been answered. Pt is appropriate for discharge.    Aiden Rao B. Rubbie, M.S., CCC-SLP, CBIS Speech-Language Pathologist Certified Brain Injury Specialist [facility]  [facility] [phone] Ascom [phone] Fax [phone]   "

## 2024-03-14 ENCOUNTER — Ambulatory Visit: Admitting: Speech Pathology

## 2024-03-19 ENCOUNTER — Other Ambulatory Visit: Payer: Self-pay | Admitting: Nurse Practitioner

## 2024-03-19 ENCOUNTER — Ambulatory Visit: Admitting: Speech Pathology

## 2024-03-21 ENCOUNTER — Ambulatory Visit: Admitting: Speech Pathology

## 2024-04-02 ENCOUNTER — Ambulatory Visit: Admitting: Speech Pathology

## 2024-04-05 ENCOUNTER — Ambulatory Visit: Admitting: Speech Pathology

## 2024-04-09 ENCOUNTER — Ambulatory Visit: Admitting: Speech Pathology

## 2024-04-11 ENCOUNTER — Ambulatory Visit: Admitting: Speech Pathology

## 2024-04-16 ENCOUNTER — Ambulatory Visit: Admitting: Speech Pathology

## 2024-04-18 ENCOUNTER — Ambulatory Visit: Admitting: Speech Pathology

## 2024-04-23 ENCOUNTER — Ambulatory Visit: Admitting: Speech Pathology

## 2024-04-25 ENCOUNTER — Ambulatory Visit: Admitting: Speech Pathology

## 2024-04-30 ENCOUNTER — Ambulatory Visit: Admitting: Speech Pathology

## 2024-05-02 ENCOUNTER — Ambulatory Visit: Admitting: Speech Pathology

## 2024-05-07 ENCOUNTER — Ambulatory Visit: Admitting: Speech Pathology

## 2024-05-09 ENCOUNTER — Ambulatory Visit: Admitting: Speech Pathology

## 2024-05-14 ENCOUNTER — Ambulatory Visit: Admitting: Speech Pathology

## 2024-05-16 ENCOUNTER — Ambulatory Visit: Admitting: Speech Pathology

## 2024-05-21 ENCOUNTER — Ambulatory Visit: Admitting: Speech Pathology

## 2024-05-23 ENCOUNTER — Ambulatory Visit: Admitting: Speech Pathology

## 2024-05-28 ENCOUNTER — Ambulatory Visit: Admitting: Speech Pathology

## 2024-05-30 ENCOUNTER — Ambulatory Visit: Admitting: Speech Pathology

## 2024-07-18 ENCOUNTER — Inpatient Hospital Stay: Admitting: Oncology

## 2024-07-18 ENCOUNTER — Inpatient Hospital Stay

## 2024-07-31 ENCOUNTER — Ambulatory Visit: Admitting: Plastic Surgery

## 2024-08-01 ENCOUNTER — Encounter: Admitting: Nurse Practitioner
